# Patient Record
Sex: Female | Born: 1970 | Race: White | Hispanic: No | Smoking: Never smoker
Health system: Southern US, Community
[De-identification: ages and names within clinical notes are randomized; demographics above are authoritative.]

## PROBLEM LIST (undated history)

## (undated) DIAGNOSIS — F419 Anxiety disorder, unspecified: Secondary | ICD-10-CM

## (undated) DIAGNOSIS — F329 Major depressive disorder, single episode, unspecified: Secondary | ICD-10-CM

## (undated) DIAGNOSIS — E785 Hyperlipidemia, unspecified: Secondary | ICD-10-CM

## (undated) DIAGNOSIS — F32A Depression, unspecified: Secondary | ICD-10-CM

## (undated) DIAGNOSIS — I10 Essential (primary) hypertension: Secondary | ICD-10-CM

## (undated) DIAGNOSIS — M549 Dorsalgia, unspecified: Secondary | ICD-10-CM

## (undated) HISTORY — PX: OTHER SURGICAL HISTORY: SHX169

## (undated) HISTORY — DX: Major depressive disorder, single episode, unspecified: F32.9

## (undated) HISTORY — PX: CHOLECYSTECTOMY: SHX55

## (undated) HISTORY — DX: Essential (primary) hypertension: I10

## (undated) HISTORY — DX: Depression, unspecified: F32.A

## (undated) HISTORY — DX: Hyperlipidemia, unspecified: E78.5

## (undated) HISTORY — PX: TOTAL ABDOMINAL HYSTERECTOMY: SHX209

---

## 2011-09-02 ENCOUNTER — Encounter (INDEPENDENT_AMBULATORY_CARE_PROVIDER_SITE_OTHER): Payer: Self-pay | Admitting: Internal Medicine

## 2011-09-02 ENCOUNTER — Ambulatory Visit (INDEPENDENT_AMBULATORY_CARE_PROVIDER_SITE_OTHER): Payer: BC Managed Care – PPO | Admitting: Internal Medicine

## 2011-09-02 VITALS — BP 96/58 | HR 60 | Temp 98.4°F | Ht 63.0 in | Wt 169.7 lb

## 2011-09-02 DIAGNOSIS — F32A Depression, unspecified: Secondary | ICD-10-CM | POA: Insufficient documentation

## 2011-09-02 DIAGNOSIS — I10 Essential (primary) hypertension: Secondary | ICD-10-CM | POA: Insufficient documentation

## 2011-09-02 DIAGNOSIS — G8929 Other chronic pain: Secondary | ICD-10-CM

## 2011-09-02 DIAGNOSIS — R109 Unspecified abdominal pain: Secondary | ICD-10-CM | POA: Insufficient documentation

## 2011-09-02 DIAGNOSIS — E785 Hyperlipidemia, unspecified: Secondary | ICD-10-CM | POA: Insufficient documentation

## 2011-09-02 DIAGNOSIS — R1011 Right upper quadrant pain: Secondary | ICD-10-CM

## 2011-09-02 DIAGNOSIS — F329 Major depressive disorder, single episode, unspecified: Secondary | ICD-10-CM | POA: Insufficient documentation

## 2011-09-02 LAB — COMPREHENSIVE METABOLIC PANEL
ALT: 29 U/L (ref 0–35)
AST: 21 U/L (ref 0–37)
Alkaline Phosphatase: 58 U/L (ref 39–117)
Sodium: 142 mEq/L (ref 135–145)
Total Bilirubin: 0.3 mg/dL (ref 0.3–1.2)
Total Protein: 6.3 g/dL (ref 6.0–8.3)

## 2011-09-02 MED ORDER — DICYCLOMINE HCL 10 MG PO CAPS: 10.0000 mg | ORAL_CAPSULE | Freq: Three times a day (TID) | ORAL | Status: DC

## 2011-09-02 MED ORDER — HYOSCYAMINE SULFATE 0.125 MG SL SUBL: 0.1250 mg | SUBLINGUAL_TABLET | SUBLINGUAL | Status: DC | PRN

## 2011-09-02 NOTE — Progress Notes (Signed)
Subjective:     Patient ID: Alisha Webster, female   DOB: 04-14-1970, 41 y.o.   MRN: [id number]  HPI Alisha Webster is a 41 yr old female here today after 2 recent visits to the ED at [facility]. She was rt sided abdominal pain. She tells me she has had this pain for about 3 weeks. She was seen in the ED at [facility] x 2 and [place]  and all labs and CT were normal. The US abdomen was also normal. She underwent a cholecystectomy in 2008 because of gallstones. She was having the same type of pain. Pain is worse while working BM usually every day or every other day. No melena or bright red rectal bleeding. Appetite is good.  No unintentional weight loss.  She has rt upper quadrant pain radiating into back. The back pain is worse.  She usually has a BM about every day.  No urinary symptoms 08/21/2011: AST 28, ALT 34, ALP 55. Albumin 4.3, Lipase 21 US abdomen at [facility] was normal.  H and H 13.3 and 40.4  In 2008 she was seen by Dr. Karilyn Cota for rt upper quadrant pain.  He suspected her abdominal pain may be due to IBS although she did not have typical symptoms. Spincter of Oddi dysfuntion was also a possibility but her LFTs were normal and her CBD was not dilated.She was tt Treated with Dicyclomine and her symptoms improved.   Review of Systems see hpi Current Outpatient Prescriptions  Medication Sig Dispense Refill  . busPIRone (BUSPAR) 15 MG tablet Take 15 mg by mouth 3 (three) times daily.      . fenofibrate micronized (LOFIBRA) 134 MG capsule Take 134 mg by mouth daily before breakfast.      . FLUoxetine (PROZAC) 40 MG capsule Take 40 mg by mouth daily.      Marland Kitchen lisinopril-hydrochlorothiazide (PRINZIDE,ZESTORETIC) 20-12.5 MG per tablet Take 1 tablet by mouth daily.      . montelukast (SINGULAIR) 10 MG tablet Take 10 mg by mouth at bedtime.       Past Medical History  Diagnosis Date  . Hypertension   . Hyperlipidemia   . Depression    Past Surgical History  Procedure Date  .  Cholecystectomy     for gallstones  . Total abdominal hysterectomy     One ovary left  . Tonisillectomy    Family Status  Relation Status Death Age  . Mother Alive     good health  . Father Deceased     leukemia  . Sister Deceased     One deceased from enlarged heart. The other 2 are in  good health  . Brother Alive     fatty liver   History   Social History  . Marital Status: Married    Spouse Name: N/A    Number of Children: N/A  . Years of Education: N/A   Occupational History  . Not on file.   Social History Main Topics  . Smoking status: Never Smoker   . Smokeless tobacco: Not on file  . Alcohol Use: No  . Drug Use: No  . Sexually Active: Not on file   Other Topics Concern  . Not on file   Social History Narrative  . No narrative on file   No Known Allergies     Objective:   Physical Exam Filed Vitals:   09/02/11 1110  Height: 5\' 3"  (1.6 m)  Weight: 169 lb 11.2 oz (76.975 kg)  Assessment:    Rt upper quadrant pain: ? Etiology. Normal tranaminases.  Possible stone CBD, but doubtful. Possible IBS.     Plan:    Will request [place] report from [place]. Cmet today. Dicyclomine 10mg  TID.

## 2011-09-02 NOTE — Patient Instructions (Signed)
Cmet. Will request US abdomen from [place]. Further recommendations to follow.

## 2011-09-08 ENCOUNTER — Encounter (INDEPENDENT_AMBULATORY_CARE_PROVIDER_SITE_OTHER): Payer: Self-pay

## 2011-09-30 ENCOUNTER — Telehealth (INDEPENDENT_AMBULATORY_CARE_PROVIDER_SITE_OTHER): Payer: Self-pay | Admitting: *Deleted

## 2011-09-30 ENCOUNTER — Other Ambulatory Visit (INDEPENDENT_AMBULATORY_CARE_PROVIDER_SITE_OTHER): Payer: Self-pay | Admitting: Internal Medicine

## 2011-09-30 DIAGNOSIS — K649 Unspecified hemorrhoids: Secondary | ICD-10-CM

## 2011-09-30 MED ORDER — HYDROCORTISONE ACETATE 25 MG RE SUPP: 25.0000 mg | Freq: Every day | RECTAL | Status: AC

## 2011-09-30 NOTE — Telephone Encounter (Signed)
Alisha Webster Alisha Webster stating she is having trouble with her hemorrhoids and would like to see if something could be called. She has tried OTC medications and they are not helping. Alisha Webster uses [facility] in [place]. Patient return phone number is [phone].

## 2011-09-30 NOTE — Telephone Encounter (Signed)
Please tell patient her prescription for [facility] suppository sent to her pharmacy.

## 2011-10-02 NOTE — Telephone Encounter (Signed)
Patient called and made aware that medication had been called in. Message left on her home phone.

## 2011-10-25 ENCOUNTER — Other Ambulatory Visit (INDEPENDENT_AMBULATORY_CARE_PROVIDER_SITE_OTHER): Payer: Self-pay | Admitting: Internal Medicine

## 2012-01-11 ENCOUNTER — Ambulatory Visit (INDEPENDENT_AMBULATORY_CARE_PROVIDER_SITE_OTHER): Payer: BC Managed Care – PPO | Admitting: Internal Medicine

## 2013-01-23 ENCOUNTER — Ambulatory Visit: Payer: BC Managed Care – PPO | Admitting: Cardiology

## 2013-01-25 ENCOUNTER — Ambulatory Visit (INDEPENDENT_AMBULATORY_CARE_PROVIDER_SITE_OTHER): Payer: PRIVATE HEALTH INSURANCE | Admitting: Cardiology

## 2013-01-25 ENCOUNTER — Ambulatory Visit: Payer: BC Managed Care – PPO | Admitting: Cardiology

## 2013-01-25 ENCOUNTER — Encounter: Payer: Self-pay | Admitting: Cardiology

## 2013-01-25 VITALS — BP 131/88 | HR 82 | Ht 63.0 in | Wt 181.4 lb

## 2013-01-25 DIAGNOSIS — R55 Syncope and collapse: Secondary | ICD-10-CM | POA: Insufficient documentation

## 2013-01-25 DIAGNOSIS — I10 Essential (primary) hypertension: Secondary | ICD-10-CM

## 2013-01-25 NOTE — Progress Notes (Signed)
Clinical Summary Alisha Webster is a 42 y.o.female    1. Syncope - early September 2014 while at home, stood up out of bed and walked to bathroom. Started filling dizzy, felt like room was spinning. No chest pain. No palpitaitons, no SOB. Fell backwards into bathtub, she does not remember. Next thing she remembers she was back in bedroom and husband was dressing her to get to hospital. Woke up with a headache, felt like hear was beating very fast.  - arrived at [place], [place] hospital ER. Told she had inner ear infection and discharged home. Had CT scan of her head, EKG, and blood tests that are not available at this time.   - no prior history of syncope. Denies any prior history of palpitations, but since that event notes some palpitations. For example, at work 1 week ago episode of feeling lightheaded, sweaty, with feeling of presyncope. Has had a few other mild episodes. Episodes are associated with heart racing. Can occur at rest or with exertion. Small episodes occur on average once a week - drinks 3 diet cokes daily. No alcohol, no drug use.  - fairly sedentary lifestyle due to back pain, notes recently she has some DOE with activities she used to tolerate. No orthopnea, no PND, no LE edema.   - half sister died at age 57 due to enlarged heart, died somewhat unexpectedly (related on mothers side).     Past Medical History  Diagnosis Date  . Hypertension   . Hyperlipidemia   . Depression   Hysterectomy   No Known Allergies   Current Outpatient Prescriptions  Medication Sig Dispense Refill  . busPIRone (BUSPAR) 15 MG tablet Take 15 mg by mouth 3 (three) times daily.      Marland Kitchen dicyclomine (BENTYL) 10 MG capsule TAKE 1 CAPSULE BY MOUTH 3 TIMES DAILY.  90 capsule  5  . fenofibrate micronized (LOFIBRA) 134 MG capsule Take 134 mg by mouth daily before breakfast.      . FLUoxetine (PROZAC) 40 MG capsule Take 40 mg by mouth daily.      . hyoscyamine (LEVSIN SL) 0.125 MG SL tablet  Place 1 tablet (0.125 mg total) under the tongue every 4 (four) hours as needed for cramping.  60 tablet  4  . lisinopril-hydrochlorothiazide (PRINZIDE,ZESTORETIC) 20-12.5 MG per tablet Take 1 tablet by mouth daily.      . montelukast (SINGULAIR) 10 MG tablet Take 10 mg by mouth at bedtime.       No current facility-administered medications for this visit.     Past Surgical History  Procedure Laterality Date  . Cholecystectomy      for gallstones  . Total abdominal hysterectomy      One ovary left  . Tonisillectomy       No Known Allergies    No family history on file.   Social History Ms. Purdum reports that she has never smoked. She does not have any smokeless tobacco history on file. Ms. Minier reports that she does not drink alcohol.   Review of Systems CONSTITUTIONAL: No weight loss, fever, chills, weakness or fatigue.  HEENT: Eyes: No visual loss, blurred vision, double vision or yellow sclerae.No hearing loss, sneezing, congestion, runny nose or sore throat.  SKIN: No rash or itching.  CARDIOVASCULAR: per HPI RESPIRATORY: No shortness of breath, cough or sputum.  GASTROINTESTINAL: No anorexia, nausea, vomiting or diarrhea. No abdominal pain or blood.  GENITOURINARY: No burning on urination, no polyuria NEUROLOGICAL: per HPI MUSCULOSKELETAL:  No muscle, back pain, joint pain or stiffness.  LYMPHATICS: No enlarged nodes. No history of splenectomy.  PSYCHIATRIC: No history of depression or anxiety.  ENDOCRINOLOGIC: No reports of sweating, cold or heat intolerance. No polyuria or polydipsia.  Marland Kitchen   Physical Examination p 82 bp 131/88 Wt 181 lbs BMI 32 Sat 98% Gen: resting comfortably, no acute distress HEENT: no scleral icterus, pupils equal round and reactive, no palptable cervical adenopathy,  CV: RRR, no m/r/g, no JVD, no carotid bruits Resp: Clear to auscultation bilaterally GI: abdomen is soft, non-tender, non-distended, normal bowel sounds, no  hepatosplenomegaly MSK: extremities are warm, no edema.  Skin: warm, no rash Neuro:  no focal deficits Psych: appropriate affect   Diagnostic Studies 01/25/13 Clinic EKG: sinus rhythm rate 63, normal axis, PR 140 ms, QRS 80 ms, Qtc 400 ms, no preexcitation, no Brugada pattern    Assessment and Plan  1. Syncope - unclear etiology at this time, symptoms are most consistent with an arrhythmia as the possible cause - will check 14 day event monitor - check 2D echo for structural heart disease, she does have a half sister who passed from a "weak heart" - obtain records from [facility] hospital visit, pending on labs they sent may need to send more at next visit (ex. TSH)  F/u 3 weeks.      Antoine Poche, M.D., F.A.C.C.

## 2013-01-25 NOTE — Patient Instructions (Signed)
Your physician recommends that you schedule a follow-up appointment in: 3 weeks with Dr. Wyline Mood. This appointment will be scheduled today before you leave today.  Your physician recommends that you continue on your current medications as directed. Please refer to the Current Medication list given to you today.  Your physician has requested that you have an echocardiogram. Echocardiography is a painless test that uses sound waves to create images of your heart. It provides your doctor with information about the size and shape of your heart and how well your heart's chambers and valves are working. This procedure takes approximately one hour. There are no restrictions for this procedure.  Your physician has recommended that you wear an 14 day event monitor. Event monitors are medical devices that record the heart's electrical activity. Doctors most often [place] these monitors to diagnose arrhythmias. Arrhythmias are problems with the speed or rhythm of the heartbeat. The monitor is a small, portable device. You can wear one while you do your normal daily activities. This is usually used to diagnose what is causing palpitations/syncope (passing out).

## 2013-01-31 DIAGNOSIS — R55 Syncope and collapse: Secondary | ICD-10-CM

## 2013-02-08 ENCOUNTER — Other Ambulatory Visit (INDEPENDENT_AMBULATORY_CARE_PROVIDER_SITE_OTHER): Payer: PRIVATE HEALTH INSURANCE

## 2013-02-08 ENCOUNTER — Other Ambulatory Visit: Payer: Self-pay

## 2013-02-08 DIAGNOSIS — R55 Syncope and collapse: Secondary | ICD-10-CM

## 2013-02-10 ENCOUNTER — Telehealth: Payer: Self-pay | Admitting: Cardiology

## 2013-02-10 NOTE — Telephone Encounter (Signed)
Pt informed of results.

## 2013-02-10 NOTE — Telephone Encounter (Signed)
Message copied by Burnice Logan on Fri Feb 10, 2013 12:03 PM ------      Message from: Dina Rich F      Created: Fri Feb 10, 2013  9:47 AM       Please let patient know there are no significant abnormalities on her echo                  Dina Rich MD ------

## 2013-02-10 NOTE — Telephone Encounter (Signed)
LM for pt to returncall

## 2013-02-14 ENCOUNTER — Ambulatory Visit (INDEPENDENT_AMBULATORY_CARE_PROVIDER_SITE_OTHER): Payer: PRIVATE HEALTH INSURANCE | Admitting: Cardiology

## 2013-02-14 ENCOUNTER — Encounter: Payer: Self-pay | Admitting: Cardiology

## 2013-02-14 VITALS — BP 127/84 | HR 71 | Ht 63.0 in | Wt 180.0 lb

## 2013-02-14 DIAGNOSIS — R55 Syncope and collapse: Secondary | ICD-10-CM

## 2013-02-14 NOTE — Progress Notes (Signed)
Clinical Summary Alisha Webster is a 42 y.o.female seen today for follow up for the following medical problems.    1. Syncope  - early September 2014 while at home, stood up out of bed and walked to bathroom. Started filling dizzy, felt like room was spinning. No chest pain. No palpitaitons, no SOB. Fell backwards into bathtub, she does not remember. Next thing she remembers she was back in bedroom and husband was dressing her to get to hospital. Woke up with a headache, felt like hear was beating very fast.  - arrived at [place], [place] hospital ER. Told she had inner ear infection and discharged home. Head CT scan of her head, EKG, and blood tests that are not available at this time.  - no prior history of syncope. Denies any prior history of palpitations, but since that event notes some palpitations. For example, at work 1 week ago episode of feeling lightheaded, sweaty, with feeling of presyncope. Has had a few other mild episodes. Episodes are associated with heart racing. Can occur at rest or with exertion. Small episodes occur on average once a week  - drinks 3 diet cokes daily. No alcohol, no drug use.  - fairly sedentary lifestyle due to back pain, notes recently she has some DOE with activities she used to tolerate. No orthopnea, no PND, no LE edema.  - half sister died at age 41 due to enlarged heart, died somewhat unexpectedly (related on mothers side).   - denies any symptoms over the last few weeks, did not have any at all while wearing monitor.     Past Medical History  Diagnosis Date  . Hypertension   . Hyperlipidemia   . Depression      No Known Allergies   Current Outpatient Prescriptions  Medication Sig Dispense Refill  . busPIRone (BUSPAR) 15 MG tablet Take 15 mg by mouth every morning.       . fenofibrate micronized (LOFIBRA) 134 MG capsule Take 134 mg by mouth daily before breakfast.      . fluticasone (FLONASE) 50 MCG/ACT nasal spray Place 2 sprays into the  nose daily.      . hyoscyamine (LEVSIN SL) 0.125 MG SL tablet Place 1 tablet (0.125 mg total) under the tongue every 4 (four) hours as needed for cramping.  60 tablet  4  . lisinopril-hydrochlorothiazide (PRINZIDE,ZESTORETIC) 20-12.5 MG per tablet Take 1 tablet by mouth every morning.       . methocarbamol (ROBAXIN) 500 MG tablet Take 500 mg by mouth 2 (two) times daily as needed.      . naproxen sodium (ANAPROX) 220 MG tablet Take 220 mg by mouth as needed.      . pantoprazole (PROTONIX) 40 MG tablet Take 40 mg by mouth daily.      Marland Kitchen venlafaxine (EFFEXOR) 37.5 MG tablet Take 37.5 mg by mouth every morning.       No current facility-administered medications for this visit.     Past Surgical History  Procedure Laterality Date  . Cholecystectomy      for gallstones  . Total abdominal hysterectomy      One ovary left  . Tonisillectomy       No Known Allergies    No family history on file.   Social History Ms. Ra reports that she has never smoked. She does not have any smokeless tobacco history on file. Ms. Cooner reports that she does not drink alcohol.   Review of Systems CONSTITUTIONAL: No weight  loss, fever, chills, weakness or fatigue.  HEENT: Eyes: No visual loss, blurred vision, double vision or yellow sclerae.No hearing loss, sneezing, congestion, runny nose or sore throat.  SKIN: No rash or itching.  CARDIOVASCULAR: per HPI RESPIRATORY: per HPI.  GASTROINTESTINAL: No anorexia, nausea, vomiting or diarrhea. No abdominal pain or blood.  GENITOURINARY: No burning on urination, no polyuria NEUROLOGICAL: No headache, dizziness, syncope, paralysis, ataxia, numbness or tingling in the extremities. No change in bowel or bladder control.  MUSCULOSKELETAL: No muscle, back pain, joint pain or stiffness.  LYMPHATICS: No enlarged nodes. No history of splenectomy.  PSYCHIATRIC: No history of depression or anxiety.  ENDOCRINOLOGIC: No reports of sweating, cold or heat  intolerance. No polyuria or polydipsia.  Marland Kitchen   Physical Examination p 71 bp 127/84 Wt 180 lbs BMI 32 Gen: resting comfortably, no acute distress HEENT: no scleral icterus, pupils equal round and reactive, no palptable cervical adenopathy,  CV: RRR, no m/r/g,no JVD, no carotid bruits Resp: Clear to auscultation bilaterally GI: abdomen is soft, non-tender, non-distended, normal bowel sounds, no hepatosplenomegaly MSK: extremities are warm, no edema.  Skin: warm, no rash Neuro:  no focal deficits Psych: appropriate affect   Diagnostic Studies 01/25/13 Clinic EKG: sinus rhythm rate 63, normal axis, PR 140 ms, QRS 80 ms, Qtc 400 ms, no preexcitation, no Brugada pattern  01/2013 Echo: LVEF 60-65%, no WMAs, grade I diastolic dysfunction.    Assessment and Plan  1. Syncope  - no significant abnormalities by echo, the early reports on her event monitor show no significant arrhytymias, she has not had any symptoms - will follow up completion of event monitor - no symptoms in quite some time, continue to follow clinically. If symptoms reoccur consider further diagnostic workup at that time.         Antoine Poche, M.D., F.A.C.C.

## 2013-02-14 NOTE — Patient Instructions (Signed)
Your physician recommends that you schedule a follow-up appointment in: 6 month with Dr. Wyline Mood. You should receive a letter in the mail in 4 months. If you do not receive this letter by March 2015 call our office to schedule this appointment.   Your physician recommends that you continue on your current medications as directed. Please refer to the Current Medication list given to you today.

## 2013-02-15 ENCOUNTER — Telehealth: Payer: Self-pay | Admitting: Cardiology

## 2013-02-15 NOTE — Telephone Encounter (Signed)
Informed pt that final results for monitor were received and monitor was normal .

## 2013-07-25 ENCOUNTER — Telehealth: Payer: Self-pay | Admitting: Cardiology

## 2013-07-25 NOTE — Telephone Encounter (Signed)
Contacted patient to try to schedule her May f/u. She doesn't feel that she needs to come back, but if she does she will contact us

## 2013-09-22 ENCOUNTER — Other Ambulatory Visit (HOSPITAL_BASED_OUTPATIENT_CLINIC_OR_DEPARTMENT_OTHER): Payer: Self-pay | Admitting: Emergency Medicine

## 2013-09-22 DIAGNOSIS — M5412 Radiculopathy, cervical region: Secondary | ICD-10-CM

## 2013-09-23 ENCOUNTER — Ambulatory Visit (HOSPITAL_BASED_OUTPATIENT_CLINIC_OR_DEPARTMENT_OTHER): Payer: PRIVATE HEALTH INSURANCE

## 2013-09-26 ENCOUNTER — Ambulatory Visit (HOSPITAL_BASED_OUTPATIENT_CLINIC_OR_DEPARTMENT_OTHER)
Admission: RE | Admit: 2013-09-26 | Discharge: 2013-09-26 | Disposition: A | Discharge status: Home / Self Care | Payer: BC Managed Care – PPO | Source: Ambulatory Visit | Attending: Emergency Medicine | Admitting: Emergency Medicine

## 2013-09-26 DIAGNOSIS — M5412 Radiculopathy, cervical region: Secondary | ICD-10-CM

## 2013-09-26 DIAGNOSIS — M5020 Other cervical disc displacement, unspecified cervical region: Secondary | ICD-10-CM | POA: Insufficient documentation

## 2013-09-26 DIAGNOSIS — M542 Cervicalgia: Secondary | ICD-10-CM | POA: Insufficient documentation

## 2013-09-26 DIAGNOSIS — M5030 Other cervical disc degeneration, unspecified cervical region: Secondary | ICD-10-CM | POA: Insufficient documentation

## 2014-01-22 ENCOUNTER — Encounter (HOSPITAL_BASED_OUTPATIENT_CLINIC_OR_DEPARTMENT_OTHER): Payer: Self-pay | Admitting: Emergency Medicine

## 2014-01-22 ENCOUNTER — Emergency Department (HOSPITAL_BASED_OUTPATIENT_CLINIC_OR_DEPARTMENT_OTHER)
Admission: EM | Admit: 2014-01-22 | Discharge: 2014-01-22 | Disposition: A | Discharge status: Home / Self Care | Payer: BC Managed Care – PPO | Attending: Emergency Medicine | Admitting: Emergency Medicine

## 2014-01-22 DIAGNOSIS — Z7951 Long term (current) use of inhaled steroids: Secondary | ICD-10-CM | POA: Insufficient documentation

## 2014-01-22 DIAGNOSIS — F329 Major depressive disorder, single episode, unspecified: Secondary | ICD-10-CM | POA: Diagnosis not present

## 2014-01-22 DIAGNOSIS — J3489 Other specified disorders of nose and nasal sinuses: Secondary | ICD-10-CM | POA: Diagnosis not present

## 2014-01-22 DIAGNOSIS — R51 Headache: Secondary | ICD-10-CM | POA: Insufficient documentation

## 2014-01-22 DIAGNOSIS — R0981 Nasal congestion: Secondary | ICD-10-CM | POA: Diagnosis not present

## 2014-01-22 DIAGNOSIS — I10 Essential (primary) hypertension: Secondary | ICD-10-CM | POA: Diagnosis not present

## 2014-01-22 DIAGNOSIS — Z8639 Personal history of other endocrine, nutritional and metabolic disease: Secondary | ICD-10-CM | POA: Diagnosis not present

## 2014-01-22 DIAGNOSIS — R6889 Other general symptoms and signs: Secondary | ICD-10-CM

## 2014-01-22 DIAGNOSIS — Z79899 Other long term (current) drug therapy: Secondary | ICD-10-CM | POA: Diagnosis not present

## 2014-01-22 DIAGNOSIS — M545 Low back pain: Secondary | ICD-10-CM | POA: Insufficient documentation

## 2014-01-22 DIAGNOSIS — R448 Other symptoms and signs involving general sensations and perceptions: Secondary | ICD-10-CM

## 2014-01-22 DIAGNOSIS — G8929 Other chronic pain: Secondary | ICD-10-CM | POA: Diagnosis not present

## 2014-01-22 DIAGNOSIS — M549 Dorsalgia, unspecified: Secondary | ICD-10-CM

## 2014-01-22 HISTORY — DX: Dorsalgia, unspecified: M54.9

## 2014-01-22 MED ORDER — OXYCODONE HCL 5 MG PO TABS: 5.0000 mg | ORAL_TABLET | Freq: Four times a day (QID) | ORAL | Status: DC | PRN

## 2014-01-22 MED ORDER — TRIAMCINOLONE ACETONIDE 55 MCG/ACT NA AERO: 2.0000 | INHALATION_SPRAY | Freq: Every day | NASAL | Status: DC

## 2014-01-22 NOTE — ED Notes (Signed)
Chronic Low back pain that pt. Has been to pain management for and is not happy with her treatment so per Pt. She is going to see a new Dr per Pt.   Pt. Reports she has had sinus headache for the past 5 days with sinus congestion and sinus headache.

## 2014-01-22 NOTE — ED Provider Notes (Signed)
Medical screening examination/treatment/procedure(s) were performed by non-physician practitioner and as supervising physician I was immediately available for consultation/collaboration.   EKG Interpretation None        Milisa Kimbell, MD 01/22/14 1508 

## 2014-01-22 NOTE — Discharge Instructions (Signed)

## 2014-01-22 NOTE — ED Notes (Addendum)
Pt directed to pick up RX in pharmacy. Pt eval by EDNP Pickering and pended for d/c prior to RN assessment

## 2014-01-22 NOTE — ED Provider Notes (Signed)
CSN: [id number]     Arrival date & time 01/22/14  1325 History   First MD Initiated Contact with Patient 01/22/14 1349     Chief Complaint  Patient presents with  . Back Pain  . Facial Pain     (Consider location/radiation/quality/duration/timing/severity/associated sxs/prior Treatment) HPI Comments: Pt comes in today with 2 complaint. Pt c/o facial pain and pressure times 4 days and chronic low back pain. Pt states that she was seeing chronic pain doctor but didn't like what they were doing so she hasn't seen them in a month. Denies any new injury or new symptoms she has just been out of her pain medications and she is not sleeping at night because of the pain. 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 Denies numbness, weakness or incontinence. No dysuria. States that she has had congestion without fever. States that she takes flonase all the time.  The history is provided by the patient. No language interpreter was used.    Past Medical History  Diagnosis Date  . Hypertension   . Hyperlipidemia   . Depression   . Back pain    Past Surgical History  Procedure Laterality Date  . Cholecystectomy      for gallstones  . Total abdominal hysterectomy      One ovary left  . Tonisillectomy     No family history on file. History  Substance Use Topics  . Smoking status: Never Smoker   . Smokeless tobacco: Never Used  . Alcohol Use: No   OB History   Grav Para Term Preterm Abortions TAB SAB Ect Mult Living                 Review of Systems  All other systems reviewed and are negative.     Allergies  Review of patient's allergies indicates no known allergies.  Home Medications   Prior to Admission medications   Medication Sig Start Date End Date Taking? Authorizing Provider  busPIRone (BUSPAR) 15 MG tablet Take 15 mg by mouth every morning.     Historical Provider, MD  fluticasone (FLONASE) 50 MCG/ACT nasal spray Place 2 sprays into the nose daily.     Historical Provider, MD  methocarbamol (ROBAXIN) 500 MG tablet Take 500 mg by mouth 2 (two) times daily as needed.    Historical Provider, MD  oxyCODONE (OXY IR/ROXICODONE) 5 MG immediate release tablet Take 1 tablet (5 mg total) by mouth every 6 (six) hours as needed for severe pain. 01/22/14   Teressa LowerVrinda Latoshia Monrroy, NP  triamcinolone (NASACORT AQ) 55 MCG/ACT AERO nasal inhaler Place 2 sprays into the nose daily. 01/22/14   Teressa LowerVrinda Marios Gaiser, NP  venlafaxine (EFFEXOR) 37.5 MG tablet Take 37.5 mg by mouth every morning.    Historical Provider, MD   BP 132/90  Pulse 84  Temp(Src) 98.4 F (36.9 C) (Oral)  Resp 18  Ht 5\' 3"  (1.6 m)  Wt 175 lb (79.379 kg)  BMI 31.01 kg/m2  SpO2 100% Physical Exam  Nursing note and vitals reviewed. Constitutional: She is oriented to person, place, and time. She appears well-developed and well-nourished.  HENT:  Nose: Rhinorrhea present.  Mouth/Throat: Oropharynx is clear and moist.  Tube noted in the right ear. Scar tissue noted in left. No infection noted to either  Eyes: Conjunctivae and EOM are normal. Pupils are equal, round, and reactive to light.  Cardiovascular: Normal rate and regular rhythm.   Pulmonary/Chest: Effort normal and breath sounds normal.  Musculoskeletal:  Normal range of motion.  Moving all extremities without any problem. Non tender to palpation  Neurological: She is alert and oriented to person, place, and time.  Skin: Skin is warm and dry.  Psychiatric: She has a normal mood and affect.    ED Course  Procedures (including critical care time) Labs Review Labs Reviewed - No data to display  Imaging Review No results found.   EKG Interpretation None      MDM   Final diagnoses:  Nasal congestion  Chronic back pain  Facial pressure    Will treat pain with oxycodone, discussed that chronic pain will not continue to be treated in the er. Don't think pt has sinusitis, think likely allergy related and pt can do symptomatic  treatment at home    Teressa LowerVrinda Chinmayi Rumer, NP 01/22/14 1414

## 2014-02-01 ENCOUNTER — Encounter (HOSPITAL_BASED_OUTPATIENT_CLINIC_OR_DEPARTMENT_OTHER): Payer: Self-pay

## 2014-02-01 ENCOUNTER — Emergency Department (HOSPITAL_BASED_OUTPATIENT_CLINIC_OR_DEPARTMENT_OTHER)
Admission: EM | Admit: 2014-02-01 | Discharge: 2014-02-01 | Disposition: A | Discharge status: Home / Self Care | Payer: BC Managed Care – PPO | Attending: Emergency Medicine | Admitting: Emergency Medicine

## 2014-02-01 DIAGNOSIS — I10 Essential (primary) hypertension: Secondary | ICD-10-CM | POA: Diagnosis not present

## 2014-02-01 DIAGNOSIS — F329 Major depressive disorder, single episode, unspecified: Secondary | ICD-10-CM | POA: Insufficient documentation

## 2014-02-01 DIAGNOSIS — R1110 Vomiting, unspecified: Secondary | ICD-10-CM | POA: Diagnosis present

## 2014-02-01 DIAGNOSIS — R197 Diarrhea, unspecified: Secondary | ICD-10-CM | POA: Diagnosis not present

## 2014-02-01 DIAGNOSIS — Z7952 Long term (current) use of systemic steroids: Secondary | ICD-10-CM | POA: Insufficient documentation

## 2014-02-01 DIAGNOSIS — Z79899 Other long term (current) drug therapy: Secondary | ICD-10-CM | POA: Diagnosis not present

## 2014-02-01 DIAGNOSIS — Z8639 Personal history of other endocrine, nutritional and metabolic disease: Secondary | ICD-10-CM | POA: Insufficient documentation

## 2014-02-01 DIAGNOSIS — Z7951 Long term (current) use of inhaled steroids: Secondary | ICD-10-CM | POA: Diagnosis not present

## 2014-02-01 LAB — URINALYSIS, ROUTINE W REFLEX MICROSCOPIC
Bilirubin Urine: NEGATIVE
GLUCOSE, UA: NEGATIVE mg/dL
Hgb urine dipstick: NEGATIVE
KETONES UR: NEGATIVE mg/dL
LEUKOCYTES UA: NEGATIVE
Nitrite: NEGATIVE
PH: 7 (ref 5.0–8.0)
Protein, ur: NEGATIVE mg/dL
Specific Gravity, Urine: 1.007 (ref 1.005–1.030)
Urobilinogen, UA: 0.2 mg/dL (ref 0.0–1.0)

## 2014-02-01 LAB — CBC WITH DIFFERENTIAL/PLATELET
Basophils Absolute: 0.1 10*3/uL (ref 0.0–0.1)
Basophils Relative: 1 % (ref 0–1)
Eosinophils Absolute: 0.3 10*3/uL (ref 0.0–0.7)
Eosinophils Relative: 3 % (ref 0–5)
HCT: 41.3 % (ref 36.0–46.0)
HEMOGLOBIN: 13.9 g/dL (ref 12.0–15.0)
LYMPHS ABS: 1.7 10*3/uL (ref 0.7–4.0)
LYMPHS PCT: 16 % (ref 12–46)
MCH: 30 pg (ref 26.0–34.0)
MCHC: 33.7 g/dL (ref 30.0–36.0)
MCV: 89 fL (ref 78.0–100.0)
Monocytes Absolute: 0.4 10*3/uL (ref 0.1–1.0)
Monocytes Relative: 4 % (ref 3–12)
Neutro Abs: 8.3 10*3/uL — ABNORMAL HIGH (ref 1.7–7.7)
Neutrophils Relative %: 76 % (ref 43–77)
PLATELETS: 283 10*3/uL (ref 150–400)
RBC: 4.64 MIL/uL (ref 3.87–5.11)
RDW: 12.3 % (ref 11.5–15.5)
WBC: 10.7 10*3/uL — AB (ref 4.0–10.5)

## 2014-02-01 LAB — COMPREHENSIVE METABOLIC PANEL
ALT: 17 U/L (ref 0–35)
AST: 15 U/L (ref 0–37)
Albumin: 3.6 g/dL (ref 3.5–5.2)
Alkaline Phosphatase: 84 U/L (ref 39–117)
Anion gap: 13 (ref 5–15)
BUN: 14 mg/dL (ref 6–23)
CALCIUM: 9.4 mg/dL (ref 8.4–10.5)
CO2: 23 mEq/L (ref 19–32)
CREATININE: 0.6 mg/dL (ref 0.50–1.10)
Chloride: 107 mEq/L (ref 96–112)
GFR calc Af Amer: >90 mL/min (ref >90)
Glucose, Bld: 108 mg/dL — ABNORMAL HIGH (ref 70–99)
Potassium: 4.5 mEq/L (ref 3.7–5.3)
Sodium: 143 mEq/L (ref 137–147)
TOTAL PROTEIN: 7.1 g/dL (ref 6.0–8.3)
Total Bilirubin: <0.2 mg/dL — ABNORMAL LOW (ref 0.3–1.2)

## 2014-02-01 MED ORDER — METOCLOPRAMIDE HCL 5 MG/ML IJ SOLN
10.0000 mg | Freq: Once | INTRAMUSCULAR | Status: AC
  Administered 2014-02-01: 10 mg via INTRAVENOUS
  Filled 2014-02-01: qty 2

## 2014-02-01 MED ORDER — ONDANSETRON HCL 4 MG/2ML IJ SOLN
4.0000 mg | Freq: Once | INTRAMUSCULAR | Status: AC
  Administered 2014-02-01: 4 mg via INTRAVENOUS
  Filled 2014-02-01: qty 2

## 2014-02-01 MED ORDER — SODIUM CHLORIDE 0.9 % IV BOLUS (SEPSIS)
1000.0000 mL | Freq: Once | INTRAVENOUS | Status: AC
  Administered 2014-02-01: 1000 mL via INTRAVENOUS

## 2014-02-01 MED ORDER — DIPHENHYDRAMINE HCL 50 MG/ML IJ SOLN
25.0000 mg | Freq: Once | INTRAMUSCULAR | Status: AC
  Administered 2014-02-01: 25 mg via INTRAVENOUS
  Filled 2014-02-01: qty 1

## 2014-02-01 MED ORDER — PROMETHAZINE HCL 25 MG PO TABS: 25.0000 mg | ORAL_TABLET | Freq: Four times a day (QID) | ORAL | Status: DC | PRN

## 2014-02-01 NOTE — ED Notes (Signed)
Pickering, EDNP notified of pt's response to reglan- vorb received for IV Benadryl 25mg 

## 2014-02-01 NOTE — ED Provider Notes (Signed)
CSN: [id number]     Arrival date & time 02/01/14  1512 History   First MD Initiated Contact with Patient 02/01/14 1520     Chief Complaint  Patient presents with  . Emesis     (Consider location/radiation/quality/duration/timing/severity/associated sxs/prior Treatment) HPI Comments: Pt comes in today with vomiting and diarrhea times a couple of days. Pt states that she has had episodes like this over the last year. She states that she came in today because she felt lightheaded. She states that she does have a history of renal failure during one of these episodes. Denies abdominal pain. State that she tried immodium without relief. States hasn't been able to keep anything down today  The history is provided by the patient. No language interpreter was used.    Past Medical History  Diagnosis Date  . Hypertension   . Hyperlipidemia   . Depression   . Back pain    Past Surgical History  Procedure Laterality Date  . Cholecystectomy      for gallstones  . Total abdominal hysterectomy      One ovary left  . Tonisillectomy     No family history on file. History  Substance Use Topics  . Smoking status: Never Smoker   . Smokeless tobacco: Never Used  . Alcohol Use: No   OB History    No data available     Review of Systems  All other systems reviewed and are negative.     Allergies  Review of patient's allergies indicates no known allergies.  Home Medications   Prior to Admission medications   Medication Sig Start Date End Date Taking? Authorizing Provider  busPIRone (BUSPAR) 15 MG tablet Take 15 mg by mouth every morning.     Historical Provider, MD  fluticasone (FLONASE) 50 MCG/ACT nasal spray Place 2 sprays into the nose daily.    Historical Provider, MD  methocarbamol (ROBAXIN) 500 MG tablet Take 500 mg by mouth 2 (two) times daily as needed.    Historical Provider, MD  oxyCODONE (OXY IR/ROXICODONE) 5 MG immediate release tablet Take 1 tablet (5 mg total) by mouth  every 6 (six) hours as needed for severe pain. 01/22/14   Teressa LowerVrinda Braidyn Scorsone, NP  triamcinolone (NASACORT AQ) 55 MCG/ACT AERO nasal inhaler Place 2 sprays into the nose daily. 01/22/14   Teressa LowerVrinda Cortez Flippen, NP  venlafaxine (EFFEXOR) 37.5 MG tablet Take 37.5 mg by mouth every morning.    Historical Provider, MD   BP 139/76 mmHg  Pulse 67  Temp(Src) 97.8 F (36.6 C) (Oral)  Resp 16  Ht 5\' 4"  (1.626 m)  Wt 176 lb (79.833 kg)  BMI 30.20 kg/m2  SpO2 100% Physical Exam  Constitutional: She is oriented to person, place, and time. She appears well-developed and well-nourished.  Cardiovascular: Normal rate and regular rhythm.   Pulmonary/Chest: Effort normal and breath sounds normal.  Abdominal: Soft. Bowel sounds are normal. There is no tenderness.  Musculoskeletal: Normal range of motion.  Neurological: She is alert and oriented to person, place, and time.  Skin: Skin is warm and dry.  Psychiatric: She has a normal mood and affect.  Nursing note and vitals reviewed.   ED Course  Procedures (including critical care time) Labs Review Labs Reviewed  COMPREHENSIVE METABOLIC PANEL - Abnormal; Notable for the following:    Glucose, Bld 108 (*)    Total Bilirubin <0.2 (*)    All other components within normal limits  CBC WITH DIFFERENTIAL - Abnormal; Notable for the following:  WBC 10.7 (*)    Neutro Abs 8.3 (*)    All other components within normal limits  URINALYSIS, ROUTINE W REFLEX MICROSCOPIC    Imaging Review No results found.   EKG Interpretation None      MDM   Final diagnoses:  Vomiting and diarrhea    Pt is tolerating po without vomiting. Abdomen is benign. Pt sent home with script of phenergan and gi follow up    Teressa LowerVrinda Jayona Mccaig, NP 02/01/14 1820  Richardean Canalavid H Yao, MD 02/02/14 [phone]

## 2014-02-01 NOTE — Discharge Instructions (Signed)

## 2014-02-01 NOTE — ED Notes (Signed)
Pt reports feeling better after benadryl  Given - d/c home with family- rx x 1 given for phenergan- work note given

## 2014-02-01 NOTE — ED Notes (Signed)
Pt reports n/v/d x "a while." Reports going on since last year. Sts lightheadedness since last night

## 2014-02-13 ENCOUNTER — Other Ambulatory Visit: Payer: Self-pay | Admitting: Gastroenterology

## 2014-02-13 DIAGNOSIS — R112 Nausea with vomiting, unspecified: Secondary | ICD-10-CM

## 2014-03-11 ENCOUNTER — Emergency Department (HOSPITAL_BASED_OUTPATIENT_CLINIC_OR_DEPARTMENT_OTHER)
Admission: EM | Admit: 2014-03-11 | Discharge: 2014-03-11 | Disposition: A | Discharge status: Home / Self Care | Payer: BC Managed Care – PPO | Attending: Emergency Medicine | Admitting: Emergency Medicine

## 2014-03-11 ENCOUNTER — Emergency Department (HOSPITAL_BASED_OUTPATIENT_CLINIC_OR_DEPARTMENT_OTHER): Payer: BC Managed Care – PPO

## 2014-03-11 ENCOUNTER — Encounter (HOSPITAL_BASED_OUTPATIENT_CLINIC_OR_DEPARTMENT_OTHER): Payer: Self-pay

## 2014-03-11 DIAGNOSIS — R05 Cough: Secondary | ICD-10-CM | POA: Diagnosis present

## 2014-03-11 DIAGNOSIS — Y9289 Other specified places as the place of occurrence of the external cause: Secondary | ICD-10-CM | POA: Insufficient documentation

## 2014-03-11 DIAGNOSIS — W19XXXA Unspecified fall, initial encounter: Secondary | ICD-10-CM

## 2014-03-11 DIAGNOSIS — Z8781 Personal history of (healed) traumatic fracture: Secondary | ICD-10-CM | POA: Diagnosis not present

## 2014-03-11 DIAGNOSIS — I10 Essential (primary) hypertension: Secondary | ICD-10-CM | POA: Insufficient documentation

## 2014-03-11 DIAGNOSIS — S8991XA Unspecified injury of right lower leg, initial encounter: Secondary | ICD-10-CM | POA: Diagnosis not present

## 2014-03-11 DIAGNOSIS — F329 Major depressive disorder, single episode, unspecified: Secondary | ICD-10-CM | POA: Diagnosis not present

## 2014-03-11 DIAGNOSIS — W010XXA Fall on same level from slipping, tripping and stumbling without subsequent striking against object, initial encounter: Secondary | ICD-10-CM | POA: Insufficient documentation

## 2014-03-11 DIAGNOSIS — Z79899 Other long term (current) drug therapy: Secondary | ICD-10-CM | POA: Insufficient documentation

## 2014-03-11 DIAGNOSIS — Z8639 Personal history of other endocrine, nutritional and metabolic disease: Secondary | ICD-10-CM | POA: Insufficient documentation

## 2014-03-11 DIAGNOSIS — Y9389 Activity, other specified: Secondary | ICD-10-CM | POA: Diagnosis not present

## 2014-03-11 DIAGNOSIS — Y998 Other external cause status: Secondary | ICD-10-CM | POA: Insufficient documentation

## 2014-03-11 DIAGNOSIS — J0100 Acute maxillary sinusitis, unspecified: Secondary | ICD-10-CM | POA: Diagnosis not present

## 2014-03-11 DIAGNOSIS — Z7951 Long term (current) use of inhaled steroids: Secondary | ICD-10-CM | POA: Diagnosis not present

## 2014-03-11 DIAGNOSIS — R42 Dizziness and giddiness: Secondary | ICD-10-CM

## 2014-03-11 MED ORDER — OXYCODONE HCL 5 MG PO TABS: 5.0000 mg | ORAL_TABLET | ORAL | Status: DC | PRN

## 2014-03-11 MED ORDER — AMOXICILLIN 500 MG PO CAPS: 500.0000 mg | ORAL_CAPSULE | Freq: Three times a day (TID) | ORAL | Status: AC

## 2014-03-11 NOTE — Discharge Instructions (Signed)
Vertigo Vertigo means you feel like you or your surroundings are moving when they are not. Vertigo can be dangerous if it occurs when you are at work, driving, or performing difficult activities.  CAUSES  Vertigo occurs when there is a conflict of signals sent to your brain from the visual and sensory systems in your body. There are many different causes of vertigo, including:  Infections, especially in the inner ear.  A bad reaction to a drug or misuse of alcohol and medicines.  Withdrawal from drugs or alcohol.  Rapidly changing positions, such as lying down or rolling over in bed.  A migraine headache.  Decreased blood flow to the brain.  Increased pressure in the brain from a head injury, infection, tumor, or bleeding. SYMPTOMS  You may feel as though the world is spinning around or you are falling to the ground. Because your balance is upset, vertigo can cause nausea and vomiting. You may have involuntary eye movements (nystagmus). DIAGNOSIS  Vertigo is usually diagnosed by physical exam. If the cause of your vertigo is unknown, your caregiver may perform imaging tests, such as an MRI scan (magnetic resonance imaging). TREATMENT  Most cases of vertigo resolve on their own, without treatment. Depending on the cause, your caregiver may prescribe certain medicines. If your vertigo is related to body position issues, your caregiver may recommend movements or procedures to correct the problem. In rare cases, if your vertigo is caused by certain inner ear problems, you may need surgery. HOME CARE INSTRUCTIONS   Follow your caregiver's instructions.  Avoid driving.  Avoid operating heavy machinery.  Avoid performing any tasks that would be dangerous to you or others during a vertigo episode.  Tell your caregiver if you notice that certain medicines seem to be causing your vertigo. Some of the medicines used to treat vertigo episodes can actually make them worse in some people. SEEK  IMMEDIATE MEDICAL CARE IF:   Your medicines do not relieve your vertigo or are making it worse.  You develop problems with talking, walking, weakness, or using your arms, hands, or legs.  You develop severe headaches.  Your nausea or vomiting continues or gets worse.  You develop visual changes.  A family member notices behavioral changes.  Your condition gets worse. MAKE SURE YOU:  Understand these instructions.  Will watch your condition.  Will get help right away if you are not doing well or get worse. Document Released: 12/24/2004 Document Revised: 06/08/2011 Document Reviewed: 10/02/2010 [facility]ExitCare Patient Information 2015 [place], [place]. This information is not intended to replace advice given to you by your health care provider. Make sure you discuss any questions you have with your health care provider.  Sinusitis Sinusitis is redness, soreness, and puffiness (inflammation) of the air pockets in the bones of your face (sinuses). The redness, soreness, and puffiness can cause air and mucus to get trapped in your sinuses. This can allow germs to grow and cause an infection.  HOME CARE   Drink enough fluids to keep your pee (urine) clear or pale yellow.  Use a humidifier in your home.  Run a hot shower to create steam in the bathroom. Sit in the bathroom with the door closed. Breathe in the steam 3-4 times a day.  Put a warm, moist washcloth on your face 3-4 times a day, or as told by your doctor.  Use salt water sprays (saline sprays) to wet the thick fluid in your nose. This can help the sinuses drain.  Only  take medicine as told by your doctor. GET HELP RIGHT AWAY IF:   Your pain gets worse.  You have very bad headaches.  You are sick to your stomach (nauseous).  You throw up (vomit).  You are very sleepy (drowsy) all the time.  Your face is puffy (swollen).  Your vision changes.  You have a stiff neck.  You have trouble breathing. MAKE SURE YOU:    Understand these instructions.  Will watch your condition.  Will get help right away if you are not doing well or get worse. Document Released: 09/02/2007 Document Revised: 12/09/2011 Document Reviewed: 10/20/2011 [facility]Exit[facility] Patient Information 2015 [place], [place]. This information is not intended to replace advice given to you by your health care provider. Make sure you discuss any questions you have with your health care provider.

## 2014-03-11 NOTE — ED Notes (Signed)
Patient reports that she slipped and fell last pm onto grass surface. Reports old right tibia fracture in July and now has ongoing right lower leg pain and also wants sinuses checked, reports bloody discharge from same

## 2014-03-11 NOTE — ED Provider Notes (Signed)
CSN: [id number]     Arrival date & time 03/11/14  1111 History   First MD Initiated Contact with Patient 03/11/14 1238     Chief Complaint  Patient presents with  . Fall      HPI  Patient presents for evaluation of altered complaints. #1 she slept last night of some pain in her leg where she fractured her fibula this July. #2 she states she's had bloody nasal discharge and sinus pain and pressure and a morning cough last 4-5 days has history of sinus infections in the past.  Past Medical History  Diagnosis Date  . Hypertension   . Hyperlipidemia   . Depression   . Back pain    Past Surgical History  Procedure Laterality Date  . Cholecystectomy      for gallstones  . Total abdominal hysterectomy      One ovary left  . Tonisillectomy     No family history on file. History  Substance Use Topics  . Smoking status: Never Smoker   . Smokeless tobacco: Never Used  . Alcohol Use: No   OB History    No data available     Review of Systems  Constitutional: Negative for fever, chills, diaphoresis, appetite change and fatigue.  HENT: Positive for congestion, postnasal drip, rhinorrhea and sinus pressure. Negative for mouth sores, sore throat and trouble swallowing.   Eyes: Negative for visual disturbance.  Respiratory: Negative for cough, chest tightness, shortness of breath and wheezing.   Cardiovascular: Negative for chest pain.  Gastrointestinal: Negative for nausea, vomiting, abdominal pain, diarrhea and abdominal distention.  Endocrine: Negative for polydipsia, polyphagia and polyuria.  Genitourinary: Negative for dysuria, frequency and hematuria.  Musculoskeletal: Positive for joint swelling, arthralgias and gait problem.  Skin: Negative for color change, pallor and rash.  Neurological: Negative for dizziness, syncope, light-headedness and headaches.  Hematological: Does not bruise/bleed easily.  Psychiatric/Behavioral: Negative for behavioral problems and confusion.       Allergies  Reglan  Home Medications   Prior to Admission medications   Medication Sig Start Date End Date Taking? Authorizing Provider  amoxicillin (AMOXIL) 500 MG capsule Take 1 capsule (500 mg total) by mouth 3 (three) times daily. 03/11/14 03/25/14  Rolland PorterMark Khole Branch, MD  busPIRone (BUSPAR) 15 MG tablet Take 15 mg by mouth every morning.     Historical Provider, MD  fluticasone (FLONASE) 50 MCG/ACT nasal spray Place 2 sprays into the nose daily.    Historical Provider, MD  methocarbamol (ROBAXIN) 500 MG tablet Take 500 mg by mouth 2 (two) times daily as needed.    Historical Provider, MD  oxyCODONE (OXY IR/ROXICODONE) 5 MG immediate release tablet Take 1 tablet (5 mg total) by mouth every 6 (six) hours as needed for severe pain. 01/22/14   Teressa LowerVrinda Pickering, NP  oxyCODONE (ROXICODONE) 5 MG immediate release tablet Take 1 tablet (5 mg total) by mouth every 4 (four) hours as needed for severe pain. 03/11/14   Rolland PorterMark Sandy Blouch, MD  promethazine (PHENERGAN) 25 MG tablet Take 1 tablet (25 mg total) by mouth every 6 (six) hours as needed for nausea or vomiting. 02/01/14   Teressa LowerVrinda Pickering, NP  triamcinolone (NASACORT AQ) 55 MCG/ACT AERO nasal inhaler Place 2 sprays into the nose daily. 01/22/14   Teressa LowerVrinda Pickering, NP  venlafaxine (EFFEXOR) 37.5 MG tablet Take 37.5 mg by mouth every morning.    Historical Provider, MD   BP 154/84 mmHg  Pulse 57  Temp(Src) 98.5 F (36.9 C)  Resp 18  Ht 5\' 4"  (1.626 m)  Wt 180 lb (81.647 kg)  BMI 30.88 kg/m2  SpO2 100%  LMP  Physical Exam  Constitutional: She is oriented to person, place, and time. She appears well-developed and well-nourished. No distress.  HENT:  Head: Normocephalic.    Ezel congestion. Posterior Franks benign.  Eyes: Conjunctivae are normal. Pupils are equal, round, and reactive to light. No scleral icterus.  Neck: Normal range of motion. Neck supple. No thyromegaly present.  Cardiovascular: Normal rate and regular rhythm.  Exam reveals  no gallop and no friction rub.   No murmur heard. Pulmonary/Chest: Effort normal and breath sounds normal. No respiratory distress. She has no wheezes. She has no rales.  Abdominal: Soft. Bowel sounds are normal. She exhibits no distension. There is no tenderness. There is no rebound.  Musculoskeletal: Normal range of motion.       Legs: Neurological: She is alert and oriented to person, place, and time.  Skin: Skin is warm and dry. No rash noted.  Psychiatric: She has a normal mood and affect. Her behavior is normal.    ED Course  Procedures (including critical care time) Labs Review Labs Reviewed - No data to display  Imaging Review Dg Tibia/fibula Right  03/11/2014   CLINICAL DATA:  Fall 7'15 w/ tistal rt fibula fx, treated w/ "air cast", fall again yesterday w/ increased rt lateral ankle pain  EXAM: RIGHT TIBIA AND FIBULA - 2 VIEW  COMPARISON:  10/07/2013  FINDINGS: Interval bridging callus across the distal fibular shaft fracture, stable alignment. No acute fracture or dislocation. No significant osseous degenerative change. Normal mineralization and alignment. Regional soft tissues unremarkable. Calcaneal spurs at the insertion of the Achilles tendon and plantar aponeurosis.  IMPRESSION: 1. Healing distal fibular fracture.  No acute abnormality.   Electronically Signed   By: Oley Balmaniel  Hassell M.D.   On: 03/11/2014 11:49     EKG Interpretation None      MDM   Final diagnoses:  Acute maxillary sinusitis, recurrence not specified  Vertigo    History treatment for her sinusitis. Antivert for the vertigo. States he has follow-up tomorrow with her pain management physician.    Rolland PorterMark Ciaran Begay, MD 03/11/14 [phone]

## 2018-05-06 ENCOUNTER — Emergency Department (HOSPITAL_COMMUNITY)
Admission: EM | Admit: 2018-05-06 | Discharge: 2018-05-06 | Disposition: A | Discharge status: Home / Self Care | Payer: Self-pay | Attending: Emergency Medicine | Admitting: Emergency Medicine

## 2018-05-06 ENCOUNTER — Other Ambulatory Visit: Payer: Self-pay

## 2018-05-06 ENCOUNTER — Encounter (HOSPITAL_COMMUNITY): Payer: Self-pay | Admitting: Emergency Medicine

## 2018-05-06 DIAGNOSIS — R220 Localized swelling, mass and lump, head: Secondary | ICD-10-CM

## 2018-05-06 DIAGNOSIS — K1120 Sialoadenitis, unspecified: Secondary | ICD-10-CM | POA: Insufficient documentation

## 2018-05-06 DIAGNOSIS — Z79899 Other long term (current) drug therapy: Secondary | ICD-10-CM | POA: Insufficient documentation

## 2018-05-06 DIAGNOSIS — I10 Essential (primary) hypertension: Secondary | ICD-10-CM | POA: Insufficient documentation

## 2018-05-06 DIAGNOSIS — R197 Diarrhea, unspecified: Secondary | ICD-10-CM | POA: Insufficient documentation

## 2018-05-06 HISTORY — DX: Anxiety disorder, unspecified: F41.9

## 2018-05-06 LAB — LACTIC ACID, PLASMA: LACTIC ACID, VENOUS: 1.5 mmol/L (ref 0.5–1.9)

## 2018-05-06 LAB — CBC WITH DIFFERENTIAL/PLATELET
ABS IMMATURE GRANULOCYTES: 0.03 10*3/uL (ref 0.00–0.07)
BASOS PCT: 1 %
Basophils Absolute: 0.1 10*3/uL (ref 0.0–0.1)
Eosinophils Absolute: 0.2 10*3/uL (ref 0.0–0.5)
Eosinophils Relative: 2 %
HCT: 43.3 % (ref 36.0–46.0)
Hemoglobin: 14.5 g/dL (ref 12.0–15.0)
IMMATURE GRANULOCYTES: 0 %
Lymphocytes Relative: 41 %
Lymphs Abs: 3.3 10*3/uL (ref 0.7–4.0)
MCH: 29.5 pg (ref 26.0–34.0)
MCHC: 33.5 g/dL (ref 30.0–36.0)
MCV: 88 fL (ref 80.0–100.0)
Monocytes Absolute: 0.5 10*3/uL (ref 0.1–1.0)
Monocytes Relative: 6 %
NEUTROS PCT: 50 %
Neutro Abs: 4 10*3/uL (ref 1.7–7.7)
Platelets: 355 10*3/uL (ref 150–400)
RBC: 4.92 MIL/uL (ref 3.87–5.11)
RDW: 12.6 % (ref 11.5–15.5)
WBC: 8 10*3/uL (ref 4.0–10.5)
nRBC: 0 % (ref 0.0–0.2)

## 2018-05-06 LAB — COMPREHENSIVE METABOLIC PANEL
ALT: 69 U/L — ABNORMAL HIGH (ref 0–44)
AST: 104 U/L — ABNORMAL HIGH (ref 15–41)
Albumin: 3.8 g/dL (ref 3.5–5.0)
Alkaline Phosphatase: 109 U/L (ref 38–126)
Anion gap: 11 (ref 5–15)
BILIRUBIN TOTAL: 0.3 mg/dL (ref 0.3–1.2)
BUN: 14 mg/dL (ref 6–20)
CO2: 27 mmol/L (ref 22–32)
Calcium: 9.3 mg/dL (ref 8.9–10.3)
Chloride: 104 mmol/L (ref 98–111)
Creatinine, Ser: 0.8 mg/dL (ref 0.44–1.00)
GFR calc Af Amer: >60 mL/min (ref >60)
Glucose, Bld: 121 mg/dL — ABNORMAL HIGH (ref 70–99)
Potassium: 3.5 mmol/L (ref 3.5–5.1)
Sodium: 142 mmol/L (ref 135–145)
TOTAL PROTEIN: 7.2 g/dL (ref 6.5–8.1)

## 2018-05-06 MED ORDER — DICYCLOMINE HCL 20 MG PO TABS: 20.0000 mg | ORAL_TABLET | Freq: Three times a day (TID) | ORAL | 0 refills | Status: DC | PRN

## 2018-05-06 MED ORDER — SODIUM CHLORIDE 0.9% FLUSH: 3.0000 mL | Freq: Once | INTRAVENOUS | Status: DC

## 2018-05-06 MED ORDER — AMOXICILLIN-POT CLAVULANATE 875-125 MG PO TABS: 1.0000 | ORAL_TABLET | Freq: Two times a day (BID) | ORAL | 0 refills | Status: DC

## 2018-05-06 NOTE — ED Triage Notes (Signed)
C/o swelling to R side of face and neck since yesterday.  Increased diarrhea for the last 2 weeks.  Reports fever 99.9 and chills.

## 2018-05-06 NOTE — ED Provider Notes (Signed)
[facility] EMERGENCY DEPARTMENT Provider Note   CSN: [id number] Arrival date & time: 05/06/18  1842     History   Chief Complaint Chief Complaint  Patient presents with  . Abscess  . Fever  . Diarrhea    HPI Sharlot Sturkey is a 48 y.o. female with a PMHx of depression, HTN, HLD, and chronic back pain, with PSHx of cholecystectomy and hysterectomy, who presents to the ED with several complaints.  Her primary complaint is swelling to the right face/cheek/jaw that began yesterday with a knot that developed under the jaw, which today became erythematous and mildly warm to the touch.  She states that she first noticed it yesterday with mostly just swelling, but then the erythema and warmth started today.  She states that she has some rawness to the inside of her cheek on the right side as well as a sore throat.  No known aggravating or alleviating factors, no treatments tried prior to arrival.  She denies any drooling, difficulty swallowing or breathing, trismus, dental pain or issues, drainage from the area, red streaking, or any other complaints related to this.  She denies any recent changes in soaps, detergents, lotions, medications, animal or plant contact, or any exposure to similar issues.  Her second complaint is 1 year of intermittent diarrhea which is been worse over the last 3 weeks, she reports 5-6 episodes daily of nonbloody diarrhea.  She also states that she has intermittent low-grade fevers of 99 but nothing over 100, and none in 3 weeks.  She also states that she has had body aches/joint pain for about a year, again nothing new or different.  She denies any current fevers or chills, chest pain, shortness of breath, abdominal pain, nausea, vomiting, constipation, obstipation, melena, hematochezia, dysuria, hematuria, numbness, tingling, focal weakness, or any other complaints at this time.  The history is provided by the patient and medical records. No language  interpreter was used.  Abscess  Associated symptoms: no fever, no nausea and no vomiting   Fever  Associated symptoms: diarrhea (x52yr), myalgias (x43yr) and sore throat   Associated symptoms: no chest pain, no chills, no confusion, no dysuria, no nausea and no vomiting   Diarrhea  Associated symptoms: arthralgias (x20yr) and myalgias (x62yr)   Associated symptoms: no abdominal pain, no chills, no fever and no vomiting     Past Medical History:  Diagnosis Date  . Anxiety   . Back pain   . Depression   . Hyperlipidemia   . Hypertension     Patient Active Problem List   Diagnosis Date Noted  . Syncope 01/25/2013  . Hypertension 09/02/2011  . Depression 09/02/2011  . Hyperlipidemia 09/02/2011  . Abdominal pain 09/02/2011    Past Surgical History:  Procedure Laterality Date  . CHOLECYSTECTOMY     for gallstones  . Tonisillectomy    . TOTAL ABDOMINAL HYSTERECTOMY     One ovary left     OB History   No obstetric history on file.      Home Medications    Prior to Admission medications   Medication Sig Start Date End Date Taking? Authorizing Provider  busPIRone (BUSPAR) 15 MG tablet Take 15 mg by mouth every morning.     [provider]  fluticasone (FLONASE) 50 MCG/ACT nasal spray Place 2 sprays into the nose daily.    [provider]  methocarbamol (ROBAXIN) 500 MG tablet Take 500 mg by mouth 2 (two) times daily as needed.  [provider]  oxyCODONE (OXY IR/ROXICODONE) 5 MG immediate release tablet Take 1 tablet (5 mg total) by mouth every 6 (six) hours as needed for severe pain. 01/22/14   Teressa LowerPickering, Vrinda, NP  oxyCODONE (ROXICODONE) 5 MG immediate release tablet Take 1 tablet (5 mg total) by mouth every 4 (four) hours as needed for severe pain. 03/11/14   Rolland PorterJames, Mark, MD  promethazine (PHENERGAN) 25 MG tablet Take 1 tablet (25 mg total) by mouth every 6 (six) hours as needed for nausea or vomiting. 02/01/14   Teressa LowerPickering, Vrinda, NP    triamcinolone (NASACORT AQ) 55 MCG/ACT AERO nasal inhaler Place 2 sprays into the nose daily. 01/22/14   Teressa LowerPickering, Vrinda, NP  venlafaxine (EFFEXOR) 37.5 MG tablet Take 37.5 mg by mouth every morning.    [provider]    Family History No family history on file.  Social History Social History   Tobacco Use  . Smoking status: Never Smoker  . Smokeless tobacco: Never Used  Substance Use Topics  . Alcohol use: No  . Drug use: No     Allergies   Reglan [metoclopramide]   Review of Systems Review of Systems  Constitutional: Negative for chills and fever.  HENT: Positive for facial swelling and sore throat. Negative for dental problem, drooling and trouble swallowing.   Respiratory: Negative for shortness of breath.   Cardiovascular: Negative for chest pain.  Gastrointestinal: Positive for diarrhea (x1473yr). Negative for abdominal pain, blood in stool, constipation, nausea and vomiting.  Genitourinary: Negative for dysuria and hematuria.  Musculoskeletal: Positive for arthralgias (x8873yr) and myalgias (x4073yr).  Skin: Positive for color change.  Allergic/Immunologic: Negative for immunocompromised state.  Neurological: Negative for weakness and numbness.  Psychiatric/Behavioral: Negative for confusion.   All other systems reviewed and are negative for acute change except as noted in the HPI.    Physical Exam Updated Vital Signs BP (!) 143/98 (BP Location: Right Arm)   Pulse 90   Temp 97.8 F (36.6 C) (Oral)   Resp 20   SpO2 99%   Physical Exam Vitals signs and nursing note reviewed.  Constitutional:      General: She is not in acute distress.    Appearance: Normal appearance. She is well-developed. She is not toxic-appearing.     Comments: Afebrile, nontoxic, NAD  HENT:     Head: Normocephalic and atraumatic.     Mouth/Throat:     Mouth: Mucous membranes are dry.     Dentition: No dental caries or dental abscesses.     Pharynx: Oropharynx is clear. Uvula  midline. No pharyngeal swelling, oropharyngeal exudate, posterior oropharyngeal erythema or uvula swelling.     Tonsils: No tonsillar exudate or tonsillar abscesses.     Comments: Dry lips, some cracking to R mouth corner.  Soft swollen area to submental region without fluctuance, mildly erythematous overlying the area, no warmth, with mild TTP to this area and tracking towards the R submandibular gland which also has some mild swelling and TTP. Mild erythema and tenderness to wharton's duct. Underside of tongue and mouth otherwise without induration or evidence of ludwig's. Posterior oropharynx clear without tonsillar swelling, no uvular swelling or deviation. No evidence of PTA. SEE PICTURE BELOW Eyes:     General:        Right eye: No discharge.        Left eye: No discharge.     Conjunctiva/sclera: Conjunctivae normal.  Neck:     Musculoskeletal: Normal range of motion and neck supple.  Cardiovascular:     Rate and Rhythm: Normal rate and regular rhythm.     Pulses: Normal pulses.     Heart sounds: Normal heart sounds, S1 normal and S2 normal. No murmur. No friction rub. No gallop.   Pulmonary:     Effort: Pulmonary effort is normal. No respiratory distress.     Breath sounds: Normal breath sounds. No decreased breath sounds, wheezing, rhonchi or rales.  Abdominal:     General: Bowel sounds are normal. There is no distension.     Palpations: Abdomen is soft. Abdomen is not rigid.     Tenderness: There is no abdominal tenderness. There is no right CVA tenderness, left CVA tenderness, guarding or rebound. Negative signs include Murphy's sign and McBurney's sign.     Comments: Soft, NTND, +BS throughout, no r/g/r, neg murphy's, neg mcburney's, no CVA TTP   Musculoskeletal: Normal range of motion.  Skin:    General: Skin is warm and dry.     Findings: Erythema present. No rash.     Comments: Neck erythema as mentioned above and pictured below  Neurological:     Mental Status: She is  alert and oriented to person, place, and time.     Sensory: Sensation is intact. No sensory deficit.     Motor: Motor function is intact.  Psychiatric:        Mood and Affect: Mood and affect normal.        Behavior: Behavior normal.        ED Treatments / Results  Labs (all labs ordered are listed, but only abnormal results are displayed) Labs Reviewed  COMPREHENSIVE METABOLIC PANEL - Abnormal; Notable for the following components:      Result Value   Glucose, Bld 121 (*)    AST 104 (*)    ALT 69 (*)    All other components within normal limits  LACTIC ACID, PLASMA  CBC WITH DIFFERENTIAL/PLATELET    EKG None  Radiology No results found.  Procedures Procedures (including critical care time)  Medications Ordered in ED Medications  sodium chloride flush (NS) 0.9 % injection 3 mL (has no administration in time range)     Initial Impression / Assessment and Plan / ED Course  I have reviewed the triage vital signs and the nursing notes.  Pertinent labs & imaging results that were available during my care of the patient were reviewed by me and considered in my medical decision making (see chart for details).  Clinical Course as of May 06 2117  Fri May 06, 2018  [id number] 48 year old female with some new facial swelling over the last couple of days mostly under her right jaw and then under the midline of the proximal neck.  There is a little bit of fullness to it but no fluctuance.  There is no intraoral swelling no poor dentition and underneath the tongue is soft.  This is likely some sort of superficial infection potentially brachial cleft cyst.  I think it be reasonable to cover with some antibiotics and refer on to ENT.   [MB]    Clinical Course User Index [MB] Terrilee FilesButler, Michael C, MD    48 y.o. female here with R facial swelling and knot under jaw that began yesterday and started having erythema today, also feels rawness to inner cheeks, and sore throat. Also reports  diarrhea x6566yr and joint pain x4066yr. On exam, throat clear, mild tenderness and erythema are wharton's duct, swelling to submental region with erythema and tenderness  which tracks towards the R submandibular gland, no fluctuance, no evidence of Ludwig's. No abdominal tenderness, otherwise well appearing. Work up thus far reveals: lactic WNL, CMP with mildly elevated AST/ALT but otherwise unremarkable, CBC WNL. Suspect sialoadenitis vs brachial cleft cyst. Doubt need for I&D of this area. Will send home with augmentin to cover for infection, and have her f/up with ENT. Regarding her other symptoms, doubt acute etiology or need for further emergent work up, could be functional diarrhea, advised f/up with her PCP/[facility] for this. BRAT diet advised. Rx for bentyl given. Discussed other OTC remedies for symptomatic relief.  F/up with PCP/[facility] in 1wk for recheck of this. I explained the diagnosis and have given explicit precautions to return to the ER including for any other new or worsening symptoms. The patient understands and accepts the medical plan as it's been dictated and I have answered their questions. Discharge instructions concerning home care and prescriptions have been given. The patient is STABLE and is discharged to home in good condition.    Final Clinical Impressions(s) / ED Diagnoses   Final diagnoses:  Sialoadenitis  Jaw swelling  Diarrhea, unspecified type    ED Discharge Orders         Ordered    dicyclomine (BENTYL) 20 MG tablet  3 times daily with meals PRN     05/06/18 2130    amoxicillin-clavulanate (AUGMENTIN) 875-125 MG tablet  2 times daily     05/06/18 [address], [place], [place] 05/06/18 2131    Terrilee Files, MD 05/06/18 [phone]

## 2018-05-06 NOTE — Discharge Instructions (Addendum)
You have what appears to be an infection in the glands under your jaw. Take antibiotic as directed until completed. Alternate between tylenol and motrin as needed for pain. Follow up with the ENT doctor in 1 week for recheck of symptoms and ongoing management of this finding.   As for your diarrhea, alternate between tylenol and ibuprofen as needed for any pain. Use bentyl as directed as needed for diarrhea and abdominal cramping. May consider using over the counter tums, maalox, pepto bismol, or other over the counter remedies to help with symptoms. Stay well hydrated with small sips of fluids throughout the day. Follow a BRAT (banana-rice-applesauce-toast) diet as described below for the next 24-48 hours. The 'BRAT' diet is suggested, then progress to diet as tolerated as symptoms abate. Call your regular doctor if bloody stools, persistent diarrhea, vomiting, fever or abdominal pain. Follow up with your regular doctor  or the [facility] in 1 week for recheck of symptoms. Return to ER for changing or worsening of symptoms.

## 2018-05-06 NOTE — ED Notes (Signed)
Pt very unhappy with her care here.  Left unhappy

## 2021-05-23 ENCOUNTER — Other Ambulatory Visit: Payer: Self-pay | Admitting: Family Medicine

## 2021-05-23 ENCOUNTER — Ambulatory Visit
Admission: RE | Admit: 2021-05-23 | Discharge: 2021-05-23 | Disposition: A | Discharge status: Home / Self Care | Payer: Medicaid Other | Source: Ambulatory Visit | Attending: Family Medicine | Admitting: Family Medicine

## 2021-05-23 DIAGNOSIS — M25561 Pain in right knee: Secondary | ICD-10-CM

## 2021-05-23 DIAGNOSIS — M25361 Other instability, right knee: Secondary | ICD-10-CM

## 2022-07-21 IMAGING — CR DG KNEE COMPLETE 4+V*R*
3 series · 3 of 3 positions shown · non-contrast
Comparison: Right knee radiographs 01/24/2015

CLINICAL DATA: Right knee pain and instability x 2-3 weeks.

EXAM:
RIGHT KNEE - COMPLETE 4+ VIEW

[w knee ap right (1 of 2)]
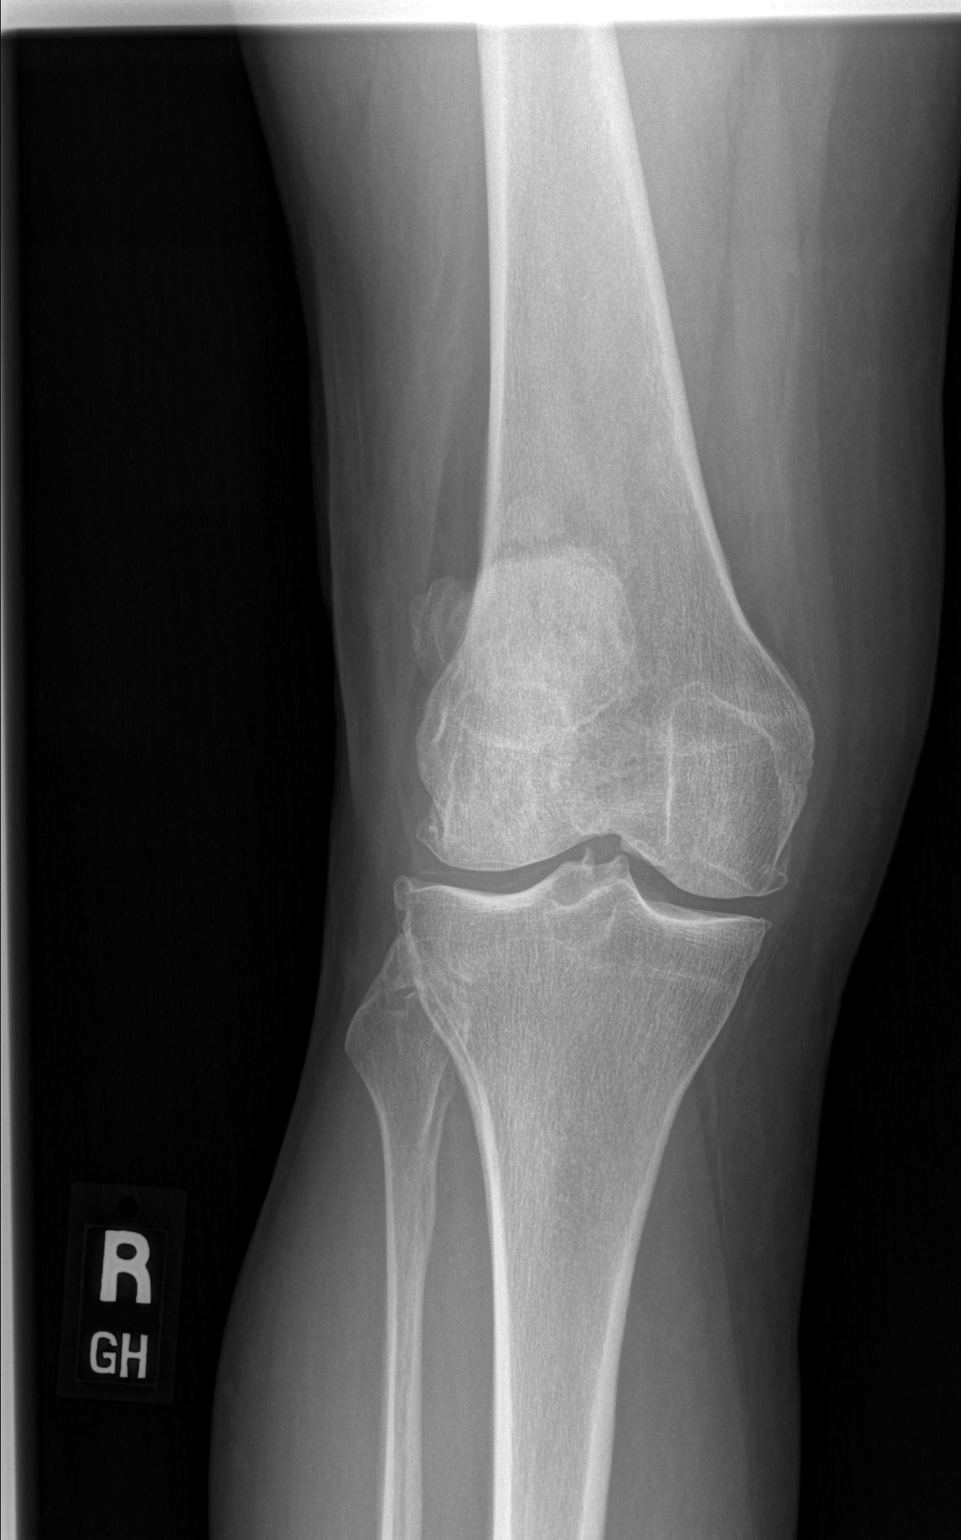

[w knee lat. right]
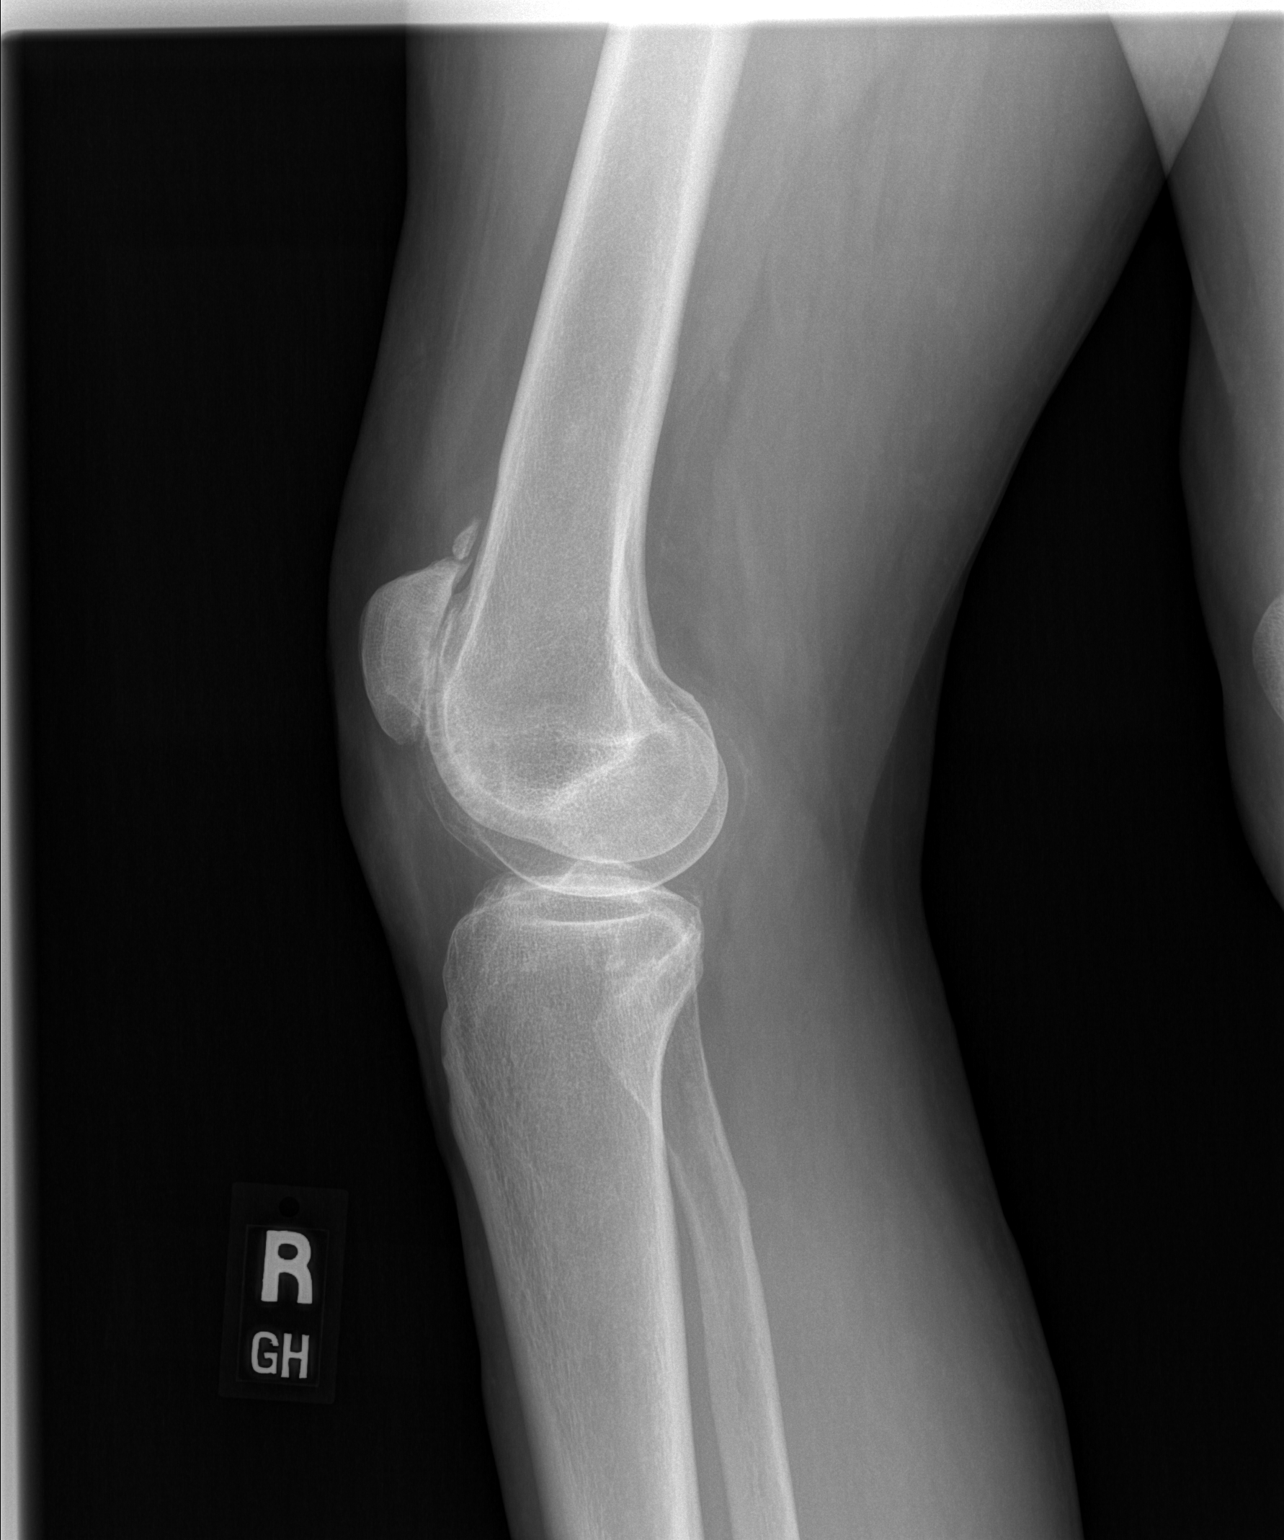

[w knee ap right (2 of 2)]
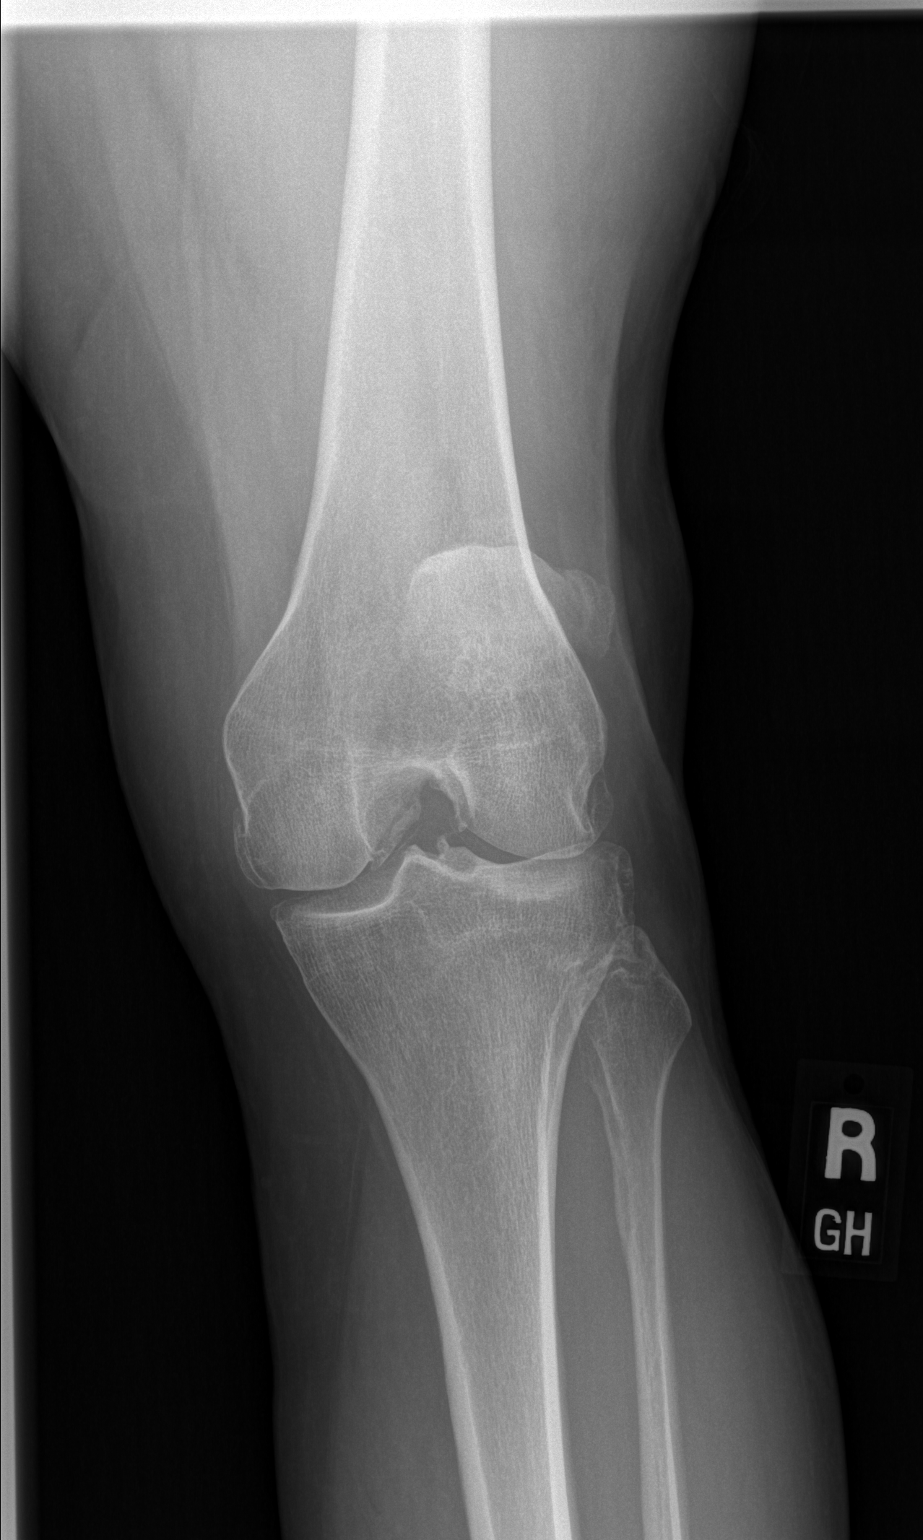

[3 of 3 positions shown; findings below may reference images not displayed]

FINDINGS: Severe lateral patellofemoral joint space narrowing and peripheral
osteophytosis. Mild medial compartment joint space narrowing.
Moderate peripheral medial and lateral compartment degenerative
osteophytes. There is a chronic 8 mm ossicle just superior to the
patella on lateral view, possibly a fragmented degenerative spur. No
joint effusion. No acute fracture or dislocation.
IMPRESSION: Severe patellofemoral and mild-to-moderate medial and lateral
compartment osteoarthritis.

## 2022-11-18 ENCOUNTER — Encounter (HOSPITAL_COMMUNITY): Payer: Self-pay

## 2022-11-18 ENCOUNTER — Other Ambulatory Visit: Payer: Self-pay

## 2022-11-18 ENCOUNTER — Observation Stay (HOSPITAL_COMMUNITY)
Admission: EM | Admit: 2022-11-18 | Discharge: 2022-11-20 | Disposition: A | Discharge status: Home / Self Care | Payer: Medicaid Other | Attending: Internal Medicine | Admitting: Internal Medicine

## 2022-11-18 DIAGNOSIS — I10 Essential (primary) hypertension: Secondary | ICD-10-CM | POA: Diagnosis present

## 2022-11-18 DIAGNOSIS — N179 Acute kidney failure, unspecified: Secondary | ICD-10-CM | POA: Diagnosis not present

## 2022-11-18 DIAGNOSIS — E785 Hyperlipidemia, unspecified: Secondary | ICD-10-CM | POA: Diagnosis present

## 2022-11-18 DIAGNOSIS — R42 Dizziness and giddiness: Secondary | ICD-10-CM | POA: Diagnosis present

## 2022-11-18 DIAGNOSIS — M3500 Sicca syndrome, unspecified: Secondary | ICD-10-CM

## 2022-11-18 DIAGNOSIS — F32A Depression, unspecified: Secondary | ICD-10-CM

## 2022-11-18 DIAGNOSIS — Z79899 Other long term (current) drug therapy: Secondary | ICD-10-CM | POA: Diagnosis not present

## 2022-11-18 DIAGNOSIS — E039 Hypothyroidism, unspecified: Secondary | ICD-10-CM

## 2022-11-18 LAB — BASIC METABOLIC PANEL
Anion gap: 11 (ref 5–15)
BUN: 25 mg/dL — ABNORMAL HIGH (ref 6–20)
CO2: 21 mmol/L — ABNORMAL LOW (ref 22–32)
Calcium: 9.2 mg/dL (ref 8.9–10.3)
Chloride: 107 mmol/L (ref 98–111)
Creatinine, Ser: 1.67 mg/dL — ABNORMAL HIGH (ref 0.44–1.00)
GFR, Estimated: 37 mL/min — ABNORMAL LOW (ref >60)
Glucose, Bld: 128 mg/dL — ABNORMAL HIGH (ref 70–99)
Potassium: 4.1 mmol/L (ref 3.5–5.1)
Sodium: 139 mmol/L (ref 135–145)

## 2022-11-18 LAB — URINALYSIS, ROUTINE W REFLEX MICROSCOPIC
Bacteria, UA: NONE SEEN
Bilirubin Urine: NEGATIVE
Glucose, UA: NEGATIVE mg/dL
Hgb urine dipstick: NEGATIVE
Ketones, ur: NEGATIVE mg/dL
Nitrite: NEGATIVE
Protein, ur: 30 mg/dL — AB
Specific Gravity, Urine: 1.02 (ref 1.005–1.030)
WBC, UA: >50 WBC/hpf (ref 0–5)
pH: 5 (ref 5.0–8.0)

## 2022-11-18 LAB — CBC
HCT: 45.4 % (ref 36.0–46.0)
Hemoglobin: 14.7 g/dL (ref 12.0–15.0)
MCH: 29.6 pg (ref 26.0–34.0)
MCHC: 32.4 g/dL (ref 30.0–36.0)
MCV: 91.3 fL (ref 80.0–100.0)
Platelets: 332 10*3/uL (ref 150–400)
RBC: 4.97 MIL/uL (ref 3.87–5.11)
RDW: 12.4 % (ref 11.5–15.5)
WBC: 10 10*3/uL (ref 4.0–10.5)
nRBC: 0 % (ref 0.0–0.2)

## 2022-11-18 LAB — CBG MONITORING, ED: Glucose-Capillary: 130 mg/dL — ABNORMAL HIGH (ref 70–99)

## 2022-11-18 MED ORDER — SODIUM CHLORIDE 0.9 % IV BOLUS
1000.0000 mL | Freq: Once | INTRAVENOUS | Status: AC
  Administered 2022-11-19: 1000 mL via INTRAVENOUS

## 2022-11-18 NOTE — ED Triage Notes (Signed)
Dizziness, weakness, feeling like heart is fluttering since this morning when pt woke up. Pt also has hand cramping. Pt went to another ER 4 days ago and potassium and magnesium were low. Pt has been taking potassium supplement, but unable to get magnesium supplement due to insurance. Denies chest pain

## 2022-11-19 DIAGNOSIS — M3500 Sicca syndrome, unspecified: Secondary | ICD-10-CM | POA: Diagnosis not present

## 2022-11-19 DIAGNOSIS — I10 Essential (primary) hypertension: Secondary | ICD-10-CM

## 2022-11-19 DIAGNOSIS — F32A Depression, unspecified: Secondary | ICD-10-CM | POA: Diagnosis not present

## 2022-11-19 DIAGNOSIS — E039 Hypothyroidism, unspecified: Secondary | ICD-10-CM

## 2022-11-19 DIAGNOSIS — N179 Acute kidney failure, unspecified: Secondary | ICD-10-CM | POA: Diagnosis not present

## 2022-11-19 DIAGNOSIS — F419 Anxiety disorder, unspecified: Secondary | ICD-10-CM | POA: Diagnosis not present

## 2022-11-19 DIAGNOSIS — Z79899 Other long term (current) drug therapy: Secondary | ICD-10-CM | POA: Diagnosis not present

## 2022-11-19 DIAGNOSIS — R42 Dizziness and giddiness: Secondary | ICD-10-CM | POA: Diagnosis present

## 2022-11-19 LAB — MAGNESIUM: Magnesium: 1.9 mg/dL (ref 1.7–2.4)

## 2022-11-19 MED ORDER — ARIPIPRAZOLE 5 MG PO TABS
5.0000 mg | ORAL_TABLET | Freq: Every day | ORAL | Status: DC
  Administered 2022-11-19 – 2022-11-20 (×2): 5 mg via ORAL
  Filled 2022-11-19 (×2): qty 1

## 2022-11-19 MED ORDER — HEPARIN SODIUM (PORCINE) 5000 UNIT/ML IJ SOLN
5000.0000 [IU] | Freq: Three times a day (TID) | INTRAMUSCULAR | Status: DC
  Filled 2022-11-19 (×3): qty 1

## 2022-11-19 MED ORDER — VENLAFAXINE HCL ER 150 MG PO CP24
150.0000 mg | ORAL_CAPSULE | Freq: Every day | ORAL | Status: DC
  Administered 2022-11-19 – 2022-11-20 (×2): 150 mg via ORAL
  Filled 2022-11-19 (×3): qty 1

## 2022-11-19 MED ORDER — ACETAMINOPHEN 650 MG RE SUPP: 650.0000 mg | Freq: Four times a day (QID) | RECTAL | Status: DC | PRN

## 2022-11-19 MED ORDER — SODIUM CHLORIDE 0.9 % IV SOLN: INTRAVENOUS | Status: AC

## 2022-11-19 MED ORDER — SODIUM CHLORIDE 0.9 % IV BOLUS
1500.0000 mL | Freq: Once | INTRAVENOUS | Status: AC
  Administered 2022-11-19: 1500 mL via INTRAVENOUS

## 2022-11-19 MED ORDER — OXYCODONE-ACETAMINOPHEN 5-325 MG PO TABS
1.0000 | ORAL_TABLET | Freq: Four times a day (QID) | ORAL | Status: DC | PRN
  Administered 2022-11-19 – 2022-11-20 (×2): 1 via ORAL
  Filled 2022-11-19 (×3): qty 1

## 2022-11-19 MED ORDER — PROMETHAZINE HCL 12.5 MG RE SUPP: 12.5000 mg | Freq: Four times a day (QID) | RECTAL | Status: DC | PRN

## 2022-11-19 MED ORDER — OXYCODONE-ACETAMINOPHEN 10-300 MG PO TABS: 1.0000 | ORAL_TABLET | Freq: Four times a day (QID) | ORAL | Status: DC | PRN

## 2022-11-19 MED ORDER — SODIUM CHLORIDE 0.9 % IV SOLN: 12.5000 mg | Freq: Four times a day (QID) | INTRAVENOUS | Status: DC | PRN

## 2022-11-19 MED ORDER — LACTATED RINGERS IV BOLUS
500.0000 mL | Freq: Once | INTRAVENOUS | Status: AC
  Administered 2022-11-19: 500 mL via INTRAVENOUS

## 2022-11-19 MED ORDER — ACETAMINOPHEN 325 MG PO TABS: 650.0000 mg | ORAL_TABLET | Freq: Four times a day (QID) | ORAL | Status: DC | PRN

## 2022-11-19 MED ORDER — ONDANSETRON HCL 4 MG/2ML IJ SOLN
INTRAMUSCULAR | Status: AC
  Filled 2022-11-19: qty 2

## 2022-11-19 MED ORDER — PROMETHAZINE HCL 25 MG PO TABS
12.5000 mg | ORAL_TABLET | Freq: Four times a day (QID) | ORAL | Status: DC | PRN
  Administered 2022-11-19: 12.5 mg via ORAL
  Filled 2022-11-19: qty 1

## 2022-11-19 MED ORDER — LEVOTHYROXINE SODIUM 88 MCG PO TABS
88.0000 ug | ORAL_TABLET | Freq: Every day | ORAL | Status: DC
  Administered 2022-11-19 – 2022-11-20 (×2): 88 ug via ORAL
  Filled 2022-11-19 (×3): qty 1

## 2022-11-19 MED ORDER — LEVETIRACETAM 500 MG PO TABS
1000.0000 mg | ORAL_TABLET | Freq: Two times a day (BID) | ORAL | Status: DC
  Administered 2022-11-19 – 2022-11-20 (×3): 1000 mg via ORAL
  Filled 2022-11-19 (×3): qty 2

## 2022-11-19 MED ORDER — LACTATED RINGERS IV SOLN: INTRAVENOUS | Status: DC

## 2022-11-19 MED ORDER — BUSPIRONE HCL 10 MG PO TABS
10.0000 mg | ORAL_TABLET | Freq: Two times a day (BID) | ORAL | Status: DC
  Administered 2022-11-19 – 2022-11-20 (×3): 10 mg via ORAL
  Filled 2022-11-19 (×3): qty 1

## 2022-11-19 MED ORDER — ONDANSETRON HCL 4 MG/2ML IJ SOLN
4.0000 mg | Freq: Once | INTRAMUSCULAR | Status: AC
  Administered 2022-11-19: 4 mg via INTRAVENOUS

## 2022-11-19 MED ORDER — GABAPENTIN 100 MG PO CAPS: 200.0000 mg | ORAL_CAPSULE | Freq: Three times a day (TID) | ORAL | Status: DC

## 2022-11-19 MED ORDER — ONDANSETRON HCL 4 MG/2ML IJ SOLN: 4.0000 mg | Freq: Four times a day (QID) | INTRAMUSCULAR | Status: DC | PRN

## 2022-11-19 MED ORDER — OXYCODONE HCL 5 MG PO TABS
5.0000 mg | ORAL_TABLET | Freq: Four times a day (QID) | ORAL | Status: DC | PRN
  Administered 2022-11-19: 5 mg via ORAL
  Filled 2022-11-19: qty 1

## 2022-11-19 NOTE — Progress Notes (Signed)
[facility] PROGRESS NOTE    Progress Note  Alisha Webster  BJY:[id number] DOB: [birth year] DOA: 11/18/2022 PCP: Maye Hides, PA     Brief Narrative:   Alisha Webster is an 52 y.o. female past medical history of hypothyroidism, essential hypertension, seizures, homelessness for 2 days relates she started having dizziness a day prior to admission with near syncopal episodes.  In the ED she started having nausea and diarrhea denies any abdominal pain relates she is taking all of her medication as before Synthroid.  Assessment/Plan:   AKI (acute kidney injury) : With a baseline creatinine of less than 1, on admission 1.6. In the setting of hydrochlorothiazide nausea, decreased oral intake and diarrhea. Started on aggressive IV fluid hydration recheck basic metabolic panel in the morning. Hold hydrochlorothiazide replete electrolytes as needed. She is being transferred to [facility] due to no bed availability at [facility]  Orthostatic hypotension: She relates very nicely that she is lightheaded upon standing and it resolves when she lays still in the bed. Hold Robaxin and Neurontin which since they tend to accumulate in acute kidney injury. Patient has no neurodeficits on physical exam. Unlikely CVA.  I think she is orthostatic from nausea vomiting and diarrhea. Will go ahead and give her a bolus of normal saline continue IV fluids continue hold antihypertensive medication and reevaluate in the morning.  Diarrhea and abdominal pain.: She has had 3 bowel movements while in the ED she states they are watery. Check her GI panel. She has remained afebrile with no leukocytosis. Contact precautions.  Essential hypertension Hold all antihypertensive medication.  Continue to monitor blood pressure.  Hyperlipidemia Continue statins.  Anxiety and depression Resume home medication.  Hypothyroidism Continue Synthroid.  Seizure disorder: Continue Keppra.     DVT  prophylaxis: lovenox Family Communication:None Status is: Observation The patient remains OBS appropriate and will d/c before 2 midnights.  Being transferred from [place] [facility] to [facility] as there are no bed available at [facility].    Code Status:     Code Status Orders  (From admission, onward)           Start     Ordered   11/19/22 0537  Full code  Continuous       Question:  By:  Answer:  Consent: discussion documented in EHR   11/19/22 0537           Code Status History     This patient has a current code status but no historical code status.         IV Access:   Peripheral IV   Procedures and diagnostic studies:   No results found.   Medical Consultants:   None.   Subjective:    [name] she relates every time she stands up she gets dizzy does not lose consciousness has not hit her head.  Objective:    Vitals:   11/18/22 2345 11/19/22 0038 11/19/22 0247 11/19/22 0520  BP: 126/73 (!) 119/90 108/71 (!) 153/98  Pulse: 95 84 87 73  Resp: 17  16 18   Temp:  97.9 F (36.6 C)  97.8 F (36.6 C)  TempSrc:  Oral  Oral  SpO2: 98% 97% 97% 99%  Weight:      Height:       SpO2: 99 %   Intake/Output Summary (Last 24 hours) at 11/19/2022 0741 Last data filed at 11/19/2022 0251 Gross per 24 hour  Intake 999 ml  Output --  Net 999  ml   Filed Weights   11/18/22 2039  Weight: 77.1 kg    Exam: General exam: In no acute distress, dry mucous membrane Respiratory system: Good air movement and clear to auscultation. Cardiovascular system: S1 & S2 heard, RRR. No JVD. Gastrointestinal system: Abdomen is nondistended, soft and nontender.  Central nervous system: Awake alert and oriented x 4 no focal deficits no asterixis. Extremities: No pedal edema. Skin: No rashes, lesions or ulcers Psychiatry: Judgement and insight appear normal. Mood & affect appropriate.    Data Reviewed:    Labs: Basic Metabolic Panel: Recent Labs   Lab 11/18/22 0016 11/18/22 2101  NA  --  139  K  --  4.1  CL  --  107  CO2  --  21*  GLUCOSE  --  128*  BUN  --  25*  CREATININE  --  1.67*  CALCIUM  --  9.2  MG 1.9  --    GFR Estimated Creatinine Clearance: 40.1 mL/min (A) (by C-G formula based on SCr of 1.67 mg/dL (H)). Liver Function Tests: No results for input(s): "AST", "ALT", "ALKPHOS", "BILITOT", "PROT", "ALBUMIN" in the last 168 hours. No results for input(s): "LIPASE", "AMYLASE" in the last 168 hours. No results for input(s): "AMMONIA" in the last 168 hours. Coagulation profile No results for input(s): "INR", "PROTIME" in the last 168 hours. COVID-19 Labs  No results for input(s): "DDIMER", "FERRITIN", "LDH", "CRP" in the last 72 hours.  No results found for: "SARSCOV2NAA"  CBC: Recent Labs  Lab 11/18/22 2101  WBC 10.0  HGB 14.7  HCT 45.4  MCV 91.3  PLT 332   Cardiac Enzymes: No results for input(s): "CKTOTAL", "CKMB", "CKMBINDEX", "TROPONINI" in the last 168 hours. BNP (last 3 results) No results for input(s): "PROBNP" in the last 8760 hours. CBG: Recent Labs  Lab 11/18/22 2256  GLUCAP 130*   D-Dimer: No results for input(s): "DDIMER" in the last 72 hours. Hgb A1c: No results for input(s): "HGBA1C" in the last 72 hours. Lipid Profile: No results for input(s): "CHOL", "HDL", "LDLCALC", "TRIG", "CHOLHDL", "LDLDIRECT" in the last 72 hours. Thyroid function studies: No results for input(s): "TSH", "T4TOTAL", "T3FREE", "THYROIDAB" in the last 72 hours.  Invalid input(s): "FREET3" Anemia work up: No results for input(s): "VITAMINB12", "FOLATE", "FERRITIN", "TIBC", "IRON", "RETICCTPCT" in the last 72 hours. Sepsis Labs: Recent Labs  Lab 11/18/22 2101  WBC 10.0   Microbiology No results found for this or any previous visit (from the past 240 hour(s)).   Medications:    ARIPiprazole  5 mg Oral Daily   busPIRone  10 mg Oral BID   gabapentin  200 mg Oral TID   heparin  5,000 Units  Subcutaneous Q8H   levETIRAcetam  1,000 mg Oral BID   levothyroxine  88 mcg Oral Q0600   venlafaxine XR  150 mg Oral Daily   Continuous Infusions:  lactated ringers 125 mL/hr at 11/19/22 0526      LOS: 0 days   Marinda Elk  [facility]  11/19/2022, 7:41 AM

## 2022-11-19 NOTE — Progress Notes (Signed)
Pt admitted from [facility] ED for palpitations,weakness, some mild lightheadedness/ syncope, n/v and diarrhea.  She is on contact isolation for r/o C.diff and is pending a GI panel.  Pt was transported via gurney accompanied by 3 transporters.  Assisted pt into her room and bed.  She was observed ambulating with a steady gait.  Reviewed POC and made pt aware of her surroundings.  Obtained VS.  Answered any pending questions.  Pt had no further questions.  Instructed pt to utilize RN call light for assistance.  Bed in lowest position, locked with two upper side rails engaged. Belongings and call light within reach.

## 2022-11-19 NOTE — Plan of Care (Signed)
Problem: Education: Goal: Knowledge of General Education information will improve Description: Including pain rating scale, medication(s)/side effects and non-pharmacologic comfort measures Outcome: Progressing Pt understands she was admitted into the hospital for palpitations,weakness, some mild lightheadedness/ syncope, n/v and diarrhea.  She is on contact isolation for r/o C.diff and is pending a GI panel.   Problem: Clinical Measurements: Goal: Ability to maintain clinical measurements within normal limits will improve Outcome: Progressing VS WNL thus far except for her BP which was elevated upon arriving to the unit.  It did improve over the shift when her n/v and pain was managed.     Problem: Clinical Measurements: Goal: Will remain free from infection Outcome: Progressing S/Sx of infection monitored and assessed q-shift.  Pt has remained afebrile thus far.  She is on contact isolation for r/o C.diff and is pending a GI panel.      Problem: Clinical Measurements: Goal: Respiratory complications will improve Outcome: Progressing Respiratory status monitored and assessed q-shift.  Pt is on room air with PO2 saturations at 99% and respiration rate of 18 breaths per minute.  She has not endorsed c/o SOE or DOE.     Problem: Clinical Measurements: Goal: Cardiovascular complication will be avoided Outcome: Progressing Pt's HR have been WNL except for her BP which was elevated upon arriving to the unit.  It did improve over the shift when her n/v and pain was managed.  She has not endorsed c/o SOE/ DOE or CP/ palpation upon arrival to the unit even though she was admitted into the hospital for palpitations,weakness, some mild lightheadedness/ syncope, n/v and diarrhea.      Problem: Activity: Goal: Risk for activity intolerance will decrease Outcome: Progressing Pt has been OOB to the bathroom several times independently. She was observed walking with a steady gait.         Problem:  Nutrition: Goal: Adequate nutrition will be maintained Outcome: Progressing Pt is on heart healthy diet per MD's orders.  She has been able to eat 50-100% of her meals thus far.  Pt continues to endorse c/o n/v and abdominal pain/ distention.    Problem: Elimination: Goal: Will not experience complications related to bowel motility Outcome: Progressing Pt has not endorse c/o constipation.  Her LBM was on 11/19/2022.  She continuous to have numerous amounts of diarrhea.  Pt is on contact isolation for r/o C.diff and is pending a GI panel.          Problem: Elimination: Goal: Will not experience complications related to urinary retention Outcome: Progressing Pt has not endorsed dysuria, abdominal pain/ distention thus far.    Problem: Pain Managment: Goal: General experience of comfort will improve Outcome: Progressing Pt has endorsed c/o 4-10/10 bilateral abdominal pain describing it as a constant pain.  Reiterated pain scale so she could adequately rate her pain.  Pt stated her pain goal this admission would be 0/10.  Discussed nonpharmacological methods to help reduce s/sx of pain.  Interventions given per pt's request and MD's orders.    Problem: Safety: Goal: Ability to remain free from injury will improve Outcome: Progressing Pt has remained free from falls thus far. Instructed pt to utilize RN call light for assistance. Hourly rounds performed. Bed alarm implemented to keep pt safe from falls. Settings activated to third most sensitive mode. Bed in lowest position, locked with two upper side rails engaged. Belongings and call light within reach.    Problem: Skin Integrity: Goal: Risk for impaired skin integrity will decrease Outcome: Progressing  Skin integrity monitored and assessed q-shift. Instructed pt to turn q2 hours to prevent further skin impairment.  Tubes and drains assessed for device related pressure sores.  Pt is continent of both bowel and bladder.

## 2022-11-19 NOTE — ED Notes (Signed)
Pt ambulatory to restroom without assistance and with a steady gait.

## 2022-11-19 NOTE — Progress Notes (Addendum)
PT Cancellation Note  Patient Details Name: Alisha Webster MRN: [id number] DOB: 1970/11/28   Cancelled Treatment:    Reason Eval/Treat Not Completed: Patient declined  PT evaluation at this time. Reports she is nauseated and just received nausea medication. Will check back as schedule allows to initiate PT eval.   Addendum: Checked on pt at 1208 and pt continued to decline PT evaluation.    Marylynn Pearson 11/19/2022, 11:35 AM  Conni Slipper, PT, DPT Acute Rehabilitation Services Secure Chat Preferred Office: [phone]

## 2022-11-19 NOTE — ED Provider Notes (Signed)
EMERGENCY DEPARTMENT AT [facility] Provider Note   CSN: [id number] Arrival date & time: 11/18/22  2013     History  Chief Complaint  Patient presents with   Dizziness    Alisha Webster is a 52 y.o. female.  Patient with past medical history significant for hypertension, depression, syncope presents to the emergency department complaining of palpitations and weakness that began this morning upon awakening.  She also complains of some mild lightheadedness when she stands.  She states she went to an outside emergency department 4 days ago and that her potassium and magnesium were low.  She states she has been taking potassium supplementation but due to insurance issues has not been taking magnesium.  She denies chest pain, shortness of breath, abdominal pain, nausea, vomiting, urinary symptoms, headache. The patient does endorse history of kidney injury due to dehydration.  HPI     Home Medications Prior to Admission medications   Medication Sig Start Date End Date Taking? Authorizing Provider  cephALEXin (KEFLEX) 500 MG capsule Take 500 mg by mouth 2 (two) times daily. 11/13/22 11/20/22 Yes [provider]  amoxicillin-clavulanate (AUGMENTIN) 875-125 MG tablet Take 1 tablet by mouth 2 (two) times daily. One po bid x 7 days 05/06/18   Street, [place], PA-C  ARIPiprazole (ABILIFY) 5 MG tablet Take 5 mg by mouth daily.    [provider]  busPIRone (BUSPAR) 15 MG tablet Take 30 mg by mouth 3 (three) times daily.     [provider]  clonazePAM (KLONOPIN) 0.5 MG tablet Take 0.5 mg by mouth 4 (four) times daily as needed for anxiety.    [provider]  dicyclomine (BENTYL) 20 MG tablet Take 1 tablet (20 mg total) by mouth 3 (three) times daily with meals as needed for spasms (abdominal spasms/cramping and/or diarrhea). 05/06/18   Street, [place], PA-C  methocarbamol (ROBAXIN) 500 MG tablet Take 1,000 mg by mouth 2 (two) times daily.     [provider]  venlafaxine XR (EFFEXOR-XR) 150 MG 24 hr capsule Take 150 mg by mouth daily with breakfast.    [provider]      Allergies    Lisinopril, Norvasc [amlodipine], Reglan [metoclopramide], and Statins    Review of Systems   Review of Systems  Physical Exam Updated Vital Signs BP 108/71   Pulse 87   Temp 97.9 F (36.6 C) (Oral)   Resp 16   Ht 5\' 4"  (1.626 m)   Wt 77.1 kg   SpO2 97%   BMI 29.18 kg/m  Physical Exam Vitals and nursing note reviewed.  Constitutional:      General: She is not in acute distress.    Appearance: She is well-developed.  HENT:     Head: Normocephalic and atraumatic.  Eyes:     Extraocular Movements: Extraocular movements intact.     Conjunctiva/sclera: Conjunctivae normal.     Pupils: Pupils are equal, round, and reactive to light.  Cardiovascular:     Rate and Rhythm: Normal rate and regular rhythm.     Heart sounds: No murmur heard. Pulmonary:     Effort: Pulmonary effort is normal. No respiratory distress.     Breath sounds: Normal breath sounds.  Abdominal:     Palpations: Abdomen is soft.     Tenderness: There is no abdominal tenderness.  Musculoskeletal:        General: No swelling.     Cervical back: Neck supple.  Skin:    General: Skin is  warm and dry.     Capillary Refill: Capillary refill takes less than 2 seconds.  Neurological:     Mental Status: She is alert and oriented to person, place, and time.     Cranial Nerves: No cranial nerve deficit.     Sensory: No sensory deficit.     Motor: No weakness.  Psychiatric:        Mood and Affect: Mood normal.     ED Results / Procedures / Treatments   Labs (all labs ordered are listed, but only abnormal results are displayed) Labs Reviewed  BASIC METABOLIC PANEL - Abnormal; Notable for the following components:      Result Value   CO2 21 (*)    Glucose, Bld 128 (*)    BUN 25 (*)    Creatinine, Ser 1.67 (*)    GFR, Estimated 37 (*)    All  other components within normal limits  URINALYSIS, ROUTINE W REFLEX MICROSCOPIC - Abnormal; Notable for the following components:   APPearance CLOUDY (*)    Protein, ur 30 (*)    Leukocytes,Ua LARGE (*)    All other components within normal limits  CBG MONITORING, ED - Abnormal; Notable for the following components:   Glucose-Capillary 130 (*)    All other components within normal limits  CBC  MAGNESIUM  BASIC METABOLIC PANEL    EKG EKG Interpretation Date/Time:  Wednesday November 18 2022 20:35:45 EDT Ventricular Rate:  99 PR Interval:  141 QRS Duration:  104 QT Interval:  355 QTC Calculation: 456 R Axis:   70  Text Interpretation: Sinus rhythm Confirmed by Tilden Fossa [phone]) on 11/18/2022 11:12:10 PM  Radiology No results found.  Procedures Procedures    Medications Ordered in ED Medications  lactated ringers infusion (has no administration in time range)  lactated ringers bolus 500 mL (has no administration in time range)  sodium chloride 0.9 % bolus 1,000 mL (0 mLs Intravenous Stopped 11/19/22 0251)    ED Course/ Medical Decision Making/ A&P                                 Medical Decision Making Amount and/or Complexity of Data Reviewed Labs: ordered.   This patient presents to the ED for concern of lightheadedness and palpitations, this involves an extensive number of treatment options, and is a complaint that carries with it a high risk of complications and morbidity.  The differential diagnosis includes dysrhythmia, dehydration, intracranial normality, others   Co morbidities that complicate the patient evaluation  History of hypertension, syncope, anxiety, kidney injury   Additional history obtained:   External records from outside source obtained and reviewed including lab results from the summer showing apparent creatinine level baseline of around 0.8   Lab Tests:  I Ordered, and personally interpreted labs.  The pertinent results include:  Creatinine 1.67 with a EGFR 37   Cardiac Monitoring: / EKG:  The patient was maintained on a cardiac monitor.  I personally viewed and interpreted the cardiac monitored which showed an underlying rhythm of: Sinus rhythm   Consultations Obtained:  I requested consultation with the hospitalist, Dr.Crosley, and discussed lab and imaging findings as well as pertinent plan - they recommend: admission   Problem List / ED Course / Critical interventions / Medication management   I ordered medication including normal saline bolus for dehydration/lightheadedness Reevaluation of the patient after these medicines showed that the patient improved I  have reviewed the patients home medicines and have made adjustments as needed   Social Determinants of Health:  Patient is Medicaid for primary health insurance type   Test / Admission - Considered:  Patient feels better symptomatically after fluid administration but does have an acute AKI. Discussed outpatient follow up after rehydration at home but patient felt uncomfortable with discharge due to history of worsened kidney injury in the past requiring hospitalization. Requested consultation with the hospitalist for admission for continued IV hydration and serial labs.          Final Clinical Impression(s) / ED Diagnoses Final diagnoses:  AKI (acute kidney injury) [facility])    Rx / DC Orders ED Discharge Orders     None         Pamala Duffel 11/19/22 0259    Tilden Fossa, MD 11/19/22 2251

## 2022-11-19 NOTE — Discharge Instructions (Signed)
[facility]) Monday - Friday 8am - 3pm          Sat & Sun 8am - 2pm [address] [place], [place] [address]   [phone]     www.[email or web address] IRC offers among other critical resources: showers, laundry, barbershop, phone bank, mailroom, computer lab, medical clinic, gardens and a bike maintenance area.   AREA SHELTERS  [facility]  (Men & women) [address] [place] [phone]  [facility] (Men/women/families) [address] [place] [phone]   [facility] (Families with children) [address]  [place] [phone]   [facility] (Domestic Violence Shelter) [address]  [place] [phone]   [facility] (Children ages 7-17) [address]  [place] [phone]   [facility]   (Women & children) [address] [place] [phone]   [facility] (Women/substance abuse) [address]  [place] [phone]   [facility] (Men) [address]  [place] [phone]   [facility] (Men) [address]  [place] [phone]  [facility] (Women) [address]  [place] [phone]   [facility] (Single women & women with children) [address]  [place] [phone]  [facility] (Men/women/families) [address]  [place] [phone]    [facility]   (Domestic Violence shelter) [address]  [place] [phone]   [facility] (Men & women) [address]  [place] [phone]  [facility] (Men) [address]  [place] [phone]   [place] (Men) [address]  [place] [phone]   [facility] (Single women & families) [address]  [place] [phone]  [facility] (Men/women & families) [address]   [place] [phone]    If you are at risk of losing your housing (throughout [place]) call the Housing Hotline at (336)691-9521. You may also contact 2-1-1, a FREE service of the [employer] that provides information about many resources including housing. Dial 211, or visit online at www.nc211.org. [place]:  [facility], contact Steve Adams [phone] (men only)                          [facility] in [place]:  90 day homeless program for women and men;                             contact Rev. Chambers [phone]  [place]:  [facility]:  men/women/children [phone]  [place]:  Shelter in [place], Pastor Kivett [phone]                       [facility] in [place], Santiago Lopez [phone]   [place]:  [facility], 910-572-3749; (women and children)  [place]:  [facility], men/women/children; [phone]                           [facility] in [place], [phone]; substance abuse halfway house for men              [facility]; 4 bedroom house in [place] for homeless women, contact Elaine Owens [phone]               [facility]   in [place], Susan Bellow, [phone] (women and children)               [facility], for abused women and children, 24 hour crisis line, 910-947-3333, Anne Friesen  [facility], halfway house for women, [place], [phone]  [place]:  [facility], [phone]; open Mon-Thurs from Jan14 - March 15 when temp is below 32 degrees                              [facility]; Pastor Jeff Looney, [phone]; cell [phone]; open 24/7  [place]:  [facility] - Rev Taylor - [phone]  [place]:  transitional housing for women and children; Sabrina Hough [phone]  

## 2022-11-19 NOTE — Plan of Care (Signed)

## 2022-11-19 NOTE — Care Management (Signed)
Transition of Care [facility]) - Inpatient Brief Assessment   Patient Details  Name: Alisha Webster MRN: [id number] Date of Birth: 1970-06-18  Transition of [facility]) CM/SW Contact:    Lockie Pares, RN Phone Number: 11/19/2022, 6:40 PM   Clinical Narrative: Patient with SDOH needs of food insecurity, housing and transportation needs, presents with dizziness and AKI. She has been homeless for a few days. Presented to the ED a few days ago as well and found to have hypokalemia. She has been taking potassium supplementation.  Housing resources placed on chart. Will follow up with patient about resources.   Transition of Care Asessment: Insurance and Status: Insurance coverage has been reviewed Patient has primary care physician: Yes Home environment has been reviewed: homeless Prior level of function:: Independent Prior/Current Home Services: No current home services Social Determinants of Health Reivew: SDOH reviewed needs interventions Readmission risk has been reviewed: Yes Transition of care needs: transition of care needs identified, TOC will continue to follow

## 2022-11-19 NOTE — Progress Notes (Signed)
OT Cancellation Note  Patient Details Name: Alisha Webster MRN: [id number] DOB: Jan 15, 1971   Cancelled Treatment:    Reason Eval/Treat Not Completed: Other (comment);Fatigue/lethargy limiting ability to participate (Per RN, pt recently given medication that makes her groggy with pt currently having difficulty maintaining alertness. RN recommends reattempting OT eval at a late time. OT to reattempt to see pt for skilled OT eval tomorrow as appropriate/available.)  Rosanne Sack "Orson Eva., OTR/L, MA Acute Rehab [phone]   Lendon Colonel 11/19/2022, 3:43 PM

## 2022-11-19 NOTE — H&P (Signed)
PCP:   Maye Hides, PA   Chief Complaint:  Dizziness  HPI: This is a 52 year old female with past medical history of hypothyroidism, HTN, seizures, anxiety and depression, chronic pain.  Yesterday patient states she was dizzy all day.  This is the point of almost passing out.  She states she was also very very weak.  In the ER she developed mild diarrhea and nausea.  She denies abdominal pain or any significant cough.  Per patient she has been homeless the past 2 days, stepped on the streets.  She states she is eating and drinking okay.  She states she is taking all of her medications except her Synthroid which she is out of.  She came to the ER.  In the ER presenting vitals are 152/106, 99, 18, afebrile.  Creatinine 1.67, baseline 0.93 in 10/2018 24.  Review of Systems:  Per HPI  Past Medical History: Past Medical History:  Diagnosis Date   Anxiety    Back pain    Depression    Hyperlipidemia    Hypertension    Past Surgical History:  Procedure Laterality Date   CHOLECYSTECTOMY     for gallstones   Tonisillectomy     TOTAL ABDOMINAL HYSTERECTOMY     One ovary left    Medications: Prior to Admission medications   Medication Sig Start Date End Date Taking? Authorizing Provider  ARIPiprazole (ABILIFY) 5 MG tablet Take 5 mg by mouth daily.   Yes [provider]  Ascorbic Acid (VITAMIN C PO) Take 1 tablet by mouth daily.   Yes [provider]  busPIRone (BUSPAR) 10 MG tablet Take 10 mg by mouth 2 (two) times daily.   Yes [provider]  cephALEXin (KEFLEX) 500 MG capsule Take 500 mg by mouth 2 (two) times daily. 11/13/22 11/20/22 Yes [provider]  CYANOCOBALAMIN PO Take 1 tablet by mouth daily.   Yes [provider]  gabapentin (NEURONTIN) 100 MG capsule Take 200 mg by mouth 3 (three) times daily.   Yes [provider]  hydrochlorothiazide (HYDRODIURIL) 25 MG tablet Take 25 mg by mouth daily.   Yes [provider]  levETIRAcetam (KEPPRA) 1000 MG tablet Take 1,000 mg by mouth 2 (two) times daily.   Yes [provider]  levothyroxine (SYNTHROID) 88 MCG tablet Take 88 mcg by mouth in the morning.   Yes [provider]  oxycodone-acetaminophen (LYNOX) 10-300 MG tablet Take 1 tablet by mouth every 6 (six) hours as needed for pain.   Yes [provider]  potassium chloride SA (KLOR-CON M) 20 MEQ tablet Take 20 mEq by mouth 2 (two) times daily.   Yes [provider]  venlafaxine XR (EFFEXOR-XR) 75 MG 24 hr capsule Take 150 mg by mouth in the morning.   Yes [provider]  VITAMIN D PO Take 1 tablet by mouth daily.   Yes [provider]  methocarbamol (ROBAXIN) 500 MG tablet Take 1,000 mg by mouth 2 (two) times daily. Patient not taking: Reported on 11/19/2022    [provider]    Allergies:   Allergies  Allergen Reactions   Lisinopril Other (See Comments)    Makes kidneys shut down   Norvasc [Amlodipine] Other (See Comments)    Leg swelling   Reglan [Metoclopramide] Other (See Comments)    Felt hot, neck flushed   Statins Other (See Comments)    Severe muscle pain    Social History:  reports that she has never smoked.  She has never used smokeless tobacco. She reports that she does not drink alcohol and does not use drugs.  Family History: History reviewed. No pertinent family history.  Physical Exam: Vitals:   11/18/22 2039 11/18/22 2345 11/19/22 0038 11/19/22 0247  BP:  126/73 (!) 119/90 108/71  Pulse:  95 84 87  Resp:  17  16  Temp:   97.9 F (36.6 C)   TempSrc:   Oral   SpO2:  98% 97% 97%  Weight: 77.1 kg     Height: 5\' 4"  (1.626 m)       General: A & O x 3, well developed and nourished, no acute distress Eyes: PERRLA, pink conjunctiva, no scleral icterus ENT: Moist oral mucosa, neck supple, no thyromegaly Lungs: CTA B/L, no wheeze, no crackles, no use of accessory muscles Cardiovascular: RRR, no regurgitation, no  gallops, no murmurs. No carotid bruits, no JVD Abdomen: soft, positive BS, NTND, no organomegaly, not an acute abdomen GU: not examined Neuro: CN II - XII grossly intact, sensation intact Musculoskeletal: strength 5/5 all extremities, no clubbing, cyanosis or edema Skin: no rash, no subcutaneous crepitation, no decubitus Psych: appropriate patient   Labs on Admission:  Recent Labs    11/18/22 0016 11/18/22 2101  NA  --  139  K  --  4.1  CL  --  107  CO2  --  21*  GLUCOSE  --  128*  BUN  --  25*  CREATININE  --  1.67*  CALCIUM  --  9.2  MG 1.9  --     Recent Labs    11/18/22 2101  WBC 10.0  HGB 14.7  HCT 45.4  MCV 91.3  PLT 332     Radiological Exams on Admission: No results found.  Assessment/Plan Present on Admission:  AKI -Brief overnight observation. -IV fluid hydration -BMP in a.m. -HCTZ on hold. -Patient without neurological deficits.  Doubt CVA with patient's elevated creatinine and now nausea and diarrhea.   Hypertension -on hydrochlorothiazide, held -As needed blood pressure medications ordered   Hypothyroidism -on synthroid, resumed   Sjogren's disease -On no medications   Anxiety and depression -buspar, abilfy and effexor resumed  Seizure -on keprra.  Per patient she takes once daily  Chronic pain -Cervical spine surgery 4wks ago -PT/OT consult placed. -Oxycodone resumed  Recently homeless -Social service consult placed  Alisha Webster 11/19/2022, 4:50 AM

## 2022-11-20 DIAGNOSIS — N179 Acute kidney failure, unspecified: Secondary | ICD-10-CM | POA: Diagnosis not present

## 2022-11-20 LAB — GASTROINTESTINAL PANEL BY PCR, STOOL (REPLACES STOOL CULTURE)

## 2022-11-20 LAB — CBC WITH DIFFERENTIAL/PLATELET
Abs Immature Granulocytes: 0.02 10*3/uL (ref 0.00–0.07)
Basophils Absolute: 0 10*3/uL (ref 0.0–0.1)
Basophils Relative: 0 %
Eosinophils Absolute: 0.1 10*3/uL (ref 0.0–0.5)
Eosinophils Relative: 2 %
HCT: 38.7 % (ref 36.0–46.0)
Hemoglobin: 12.8 g/dL (ref 12.0–15.0)
Immature Granulocytes: 0 %
Lymphocytes Relative: 37 %
Lymphs Abs: 2.2 10*3/uL (ref 0.7–4.0)
MCH: 30.3 pg (ref 26.0–34.0)
MCHC: 33.1 g/dL (ref 30.0–36.0)
MCV: 91.5 fL (ref 80.0–100.0)
Monocytes Absolute: 0.3 10*3/uL (ref 0.1–1.0)
Monocytes Relative: 5 %
Neutro Abs: 3.2 10*3/uL (ref 1.7–7.7)
Neutrophils Relative %: 56 %
Platelets: 240 10*3/uL (ref 150–400)
RBC: 4.23 MIL/uL (ref 3.87–5.11)
RDW: 12.2 % (ref 11.5–15.5)
WBC: 5.9 10*3/uL (ref 4.0–10.5)
nRBC: 0 % (ref 0.0–0.2)

## 2022-11-20 LAB — BASIC METABOLIC PANEL
Anion gap: 7 (ref 5–15)
BUN: 12 mg/dL (ref 6–20)
CO2: 23 mmol/L (ref 22–32)
Calcium: 8.5 mg/dL — ABNORMAL LOW (ref 8.9–10.3)
Chloride: 109 mmol/L (ref 98–111)
Creatinine, Ser: 0.87 mg/dL (ref 0.44–1.00)
GFR, Estimated: >60 mL/min (ref >60)
Glucose, Bld: 94 mg/dL (ref 70–99)
Potassium: 3.9 mmol/L (ref 3.5–5.1)
Sodium: 139 mmol/L (ref 135–145)

## 2022-11-20 NOTE — Progress Notes (Signed)
OT Cancellation Note  Patient Details Name: Alisha Webster MRN: [id number] DOB: [birth year]   Cancelled Treatment:    Reason Eval/Treat Not Completed: Patient declined, no reason specified (Pt currently refusing skilled rehab services. Per conversation with RN and secure chat with PT, pt with no equipment needs and no further benefit from acute skilled OT services at this time. OT is signing off.) If pt would like to receive skilled rehab services in the future, please re-consult.   Nasif Bos "Orson Eva., OTR/L, MA Acute Rehab [phone]   Lendon Colonel 11/20/2022, 4:40 PM

## 2022-11-20 NOTE — Progress Notes (Signed)
PT Cancellation Note  Patient Details Name: Alisha Webster MRN: [id number] DOB: April 20, 1970   Cancelled Treatment:    Reason Eval/Treat Not Completed: Patient declined, no reason specified (Pt states she has no needs and does not want physical therapy. Pt will be discharged at this time. If pt would like to receive skilled therapy services in the future please re-consult.)  Harrel Carina, DPT, CLT  Acute Rehabilitation Services Office: [phone] (Secure chat preferred)   Claudia Desanctis 11/20/2022, 10:04 AM

## 2022-11-20 NOTE — Discharge Summary (Signed)
Physician Discharge Summary  Alisha Webster YTK:[id number] DOB: September 06, 1970 DOA: 11/18/2022  PCP: Maye Hides, PA  Admit date: 11/18/2022 Discharge date: 11/20/2022  Admitted From: Home Disposition:  Home  Discharge Condition:Stable CODE STATUS:FULL Diet recommendation: Heart Healthy  Brief/Interim Summary: Patient is an 52 y.o. female  homeless female past medical history of hypothyroidism, essential hypertension, seizures, homelessness for 2 days relates she started having dizziness a day prior to admission with near syncopal episodes.  Patient also reported dizziness, was very weak, was having mild diarrhea and nausea.  Denies any abdominal pain.  No vomiting.  Patient seems mildly hypertensive, creatinine was elevated.  Patient was admitted for the management of AKI, started on IV fluids.  This morning her kidney function has completed normalized.  She has had 1 episode of diarrhea this morning.  Denies any vomiting or abdominal pain.  Abdomen is soft and nondistended.  She is stable for discharge .  TOC was consulted for concern of homelessness.  Following problems were addressed during the hospitalization:  AKI: Resolved with IV fluids .  Can resume hydrochlorothiazide on discharge  Orthostatic hypotension: Last set of orthostatic vitals were negative.  Denies any dizziness or lightheadedness this morning  Diarrhea/abdomen pain: Had 1 episode of loose stool today.  Abdomen is soft and nondistended.  Denies abdominal pain  today.  GI pathogen panel sent not resulted yet.  No leukocytosis.  Hypertension: Continue home regimen  Hypertension: Continue statin  Anxiety/depression: Continue home regimen  Hypothyroidism: Continue Synthyroid  Seizure disorder: Continue Keppra   Discharge Diagnoses:  Principal Problem:   AKI (acute kidney injury) (HCC) Active Problems:   Hypertension   Hyperlipidemia   Anxiety and depression   Hypothyroidism   Sjogren's disease  (HCC)    Discharge Instructions  Discharge Instructions     Diet general   Complete by: As directed    Discharge instructions   Complete by: As directed    1)Please follow up with your PCP in a week   Increase activity slowly   Complete by: As directed       Allergies as of 11/20/2022       Reactions   Lisinopril Other (See Comments)   Makes kidneys shut down   Norvasc [amlodipine] Other (See Comments)   Leg swelling   Reglan [metoclopramide] Other (See Comments)   Felt hot, neck flushed   Statins Other (See Comments)   Severe muscle pain        Medication List     TAKE these medications    ARIPiprazole 5 MG tablet Commonly known as: ABILIFY Take 5 mg by mouth daily.   busPIRone 10 MG tablet Commonly known as: BUSPAR Take 10 mg by mouth 2 (two) times daily.   gabapentin 100 MG capsule Commonly known as: NEURONTIN Take 200 mg by mouth 3 (three) times daily.   hydrochlorothiazide 25 MG tablet Commonly known as: HYDRODIURIL Take 25 mg by mouth daily.   levETIRAcetam 1000 MG tablet Commonly known as: KEPPRA Take 1,000 mg by mouth 2 (two) times daily.   levothyroxine 88 MCG tablet Commonly known as: SYNTHROID Take 88 mcg by mouth in the morning.   oxycodone-acetaminophen 10-300 MG tablet Commonly known as: LYNOX Take 1 tablet by mouth every 6 (six) hours as needed for pain.   potassium chloride SA 20 MEQ tablet Commonly known as: KLOR-CON M Take 20 mEq by mouth 2 (two) times daily.   venlafaxine XR 75 MG 24 hr capsule Commonly known as: EFFEXOR-XR Take  150 mg by mouth in the morning.   VITAMIN D PO Take 1 tablet by mouth daily.        Follow-up Information     Maye Hides, [place]. Schedule an appointment as soon as possible for a visit in 1 week(s).   Specialty: Physician Assistant Contact information: [phone] [address] [place] [address] [phone]                Allergies  Allergen Reactions   Lisinopril Other (See  Comments)    Makes kidneys shut down   Norvasc [Amlodipine] Other (See Comments)    Leg swelling   Reglan [Metoclopramide] Other (See Comments)    Felt hot, neck flushed   Statins Other (See Comments)    Severe muscle pain    Consultations: None   Procedures/Studies: No results found.    Subjective: Patient seen and examined at bedside today.  Hemodynamically stable for discharge.  Discharge Exam: Vitals:   11/20/22 0354 11/20/22 0811  BP: 109/67 136/84  Pulse: 65 69  Resp: 16 16  Temp: 97.7 F (36.5 C) 98.1 F (36.7 C)  SpO2: 100% 99%   Vitals:   11/19/22 1500 11/19/22 1945 11/20/22 0354 11/20/22 0811  BP: 111/74 129/82 109/67 136/84  Pulse: 64 67 65 69  Resp: 18 18 16 16   Temp: 97.8 F (36.6 C) 98 F (36.7 C) 97.7 F (36.5 C) 98.1 F (36.7 C)  TempSrc: Oral Oral Oral Oral  SpO2:  97% 100% 99%  Weight:      Height:        General: Pt is alert, awake, not in acute distress Cardiovascular: RRR, S1/S2 +, no rubs, no gallops Respiratory: CTA bilaterally, no wheezing, no rhonchi Abdominal: Soft, NT, ND, bowel sounds + Extremities: no edema, no cyanosis    The results of significant diagnostics from this hospitalization (including imaging, microbiology, ancillary and laboratory) are listed below for reference.     Microbiology: No results found for this or any previous visit (from the past 240 hour(s)).   Labs: BNP (last 3 results) No results for input(s): "BNP" in the last 8760 hours. Basic Metabolic Panel: Recent Labs  Lab 11/18/22 0016 11/18/22 2101 11/20/22 0430  NA  --  139 139  K  --  4.1 3.9  CL  --  107 109  CO2  --  21* 23  GLUCOSE  --  128* 94  BUN  --  25* 12  CREATININE  --  1.67* 0.87  CALCIUM  --  9.2 8.5*  MG 1.9  --   --    Liver Function Tests: No results for input(s): "AST", "ALT", "ALKPHOS", "BILITOT", "PROT", "ALBUMIN" in the last 168 hours. No results for input(s): "LIPASE", "AMYLASE" in the last 168 hours. No  results for input(s): "AMMONIA" in the last 168 hours. CBC: Recent Labs  Lab 11/18/22 2101 11/20/22 0430  WBC 10.0 5.9  NEUTROABS  --  3.2  HGB 14.7 12.8  HCT 45.4 38.7  MCV 91.3 91.5  PLT 332 240   Cardiac Enzymes: No results for input(s): "CKTOTAL", "CKMB", "CKMBINDEX", "TROPONINI" in the last 168 hours. BNP: Invalid input(s): "POCBNP" CBG: Recent Labs  Lab 11/18/22 2256  GLUCAP 130*   D-Dimer No results for input(s): "DDIMER" in the last 72 hours. Hgb A1c No results for input(s): "HGBA1C" in the last 72 hours. Lipid Profile No results for input(s): "CHOL", "HDL", "LDLCALC", "TRIG", "CHOLHDL", "LDLDIRECT" in the last 72 hours. Thyroid function studies No results  for input(s): "TSH", "T4TOTAL", "T3FREE", "THYROIDAB" in the last 72 hours.  Invalid input(s): "FREET3" Anemia work up No results for input(s): "VITAMINB12", "FOLATE", "FERRITIN", "TIBC", "IRON", "RETICCTPCT" in the last 72 hours. Urinalysis    Component Value Date/Time   COLORURINE YELLOW 11/18/2022 2055   APPEARANCEUR CLOUDY (A) 11/18/2022 2055   LABSPEC 1.020 11/18/2022 2055   PHURINE 5.0 11/18/2022 2055   GLUCOSEU NEGATIVE 11/18/2022 2055   HGBUR NEGATIVE 11/18/2022 2055   BILIRUBINUR NEGATIVE 11/18/2022 2055   KETONESUR NEGATIVE 11/18/2022 2055   PROTEINUR 30 (A) 11/18/2022 2055   UROBILINOGEN 0.2 02/01/2014 1701   NITRITE NEGATIVE 11/18/2022 2055   LEUKOCYTESUR LARGE (A) 11/18/2022 2055   Sepsis Labs Recent Labs  Lab 11/18/22 2101 11/20/22 0430  WBC 10.0 5.9   Microbiology No results found for this or any previous visit (from the past 240 hour(s)).  Please note: You were cared for by a hospitalist during your hospital stay. Once you are discharged, your primary care physician will handle any further medical issues. Please note that NO REFILLS for any discharge medications will be authorized once you are discharged, as it is imperative that you return to your primary care physician (or  establish a relationship with a primary care physician if you do not have one) for your post hospital discharge needs so that they can reassess your need for medications and monitor your lab values.    Time coordinating discharge: 40 minutes  SIGNED:   Burnadette Pop, MD  [facility] 11/20/2022, 1:21 PM Pager [phone]  If 7PM-7AM, please contact night-coverage [email or web address] Password [email or web address]

## 2022-11-20 NOTE — Plan of Care (Signed)

## 2022-11-20 NOTE — TOC Transition Note (Signed)
Transition of Care [facility]) - CM/SW Discharge Note   Patient Details  Name: Alisha Webster MRN: [id number] Date of Birth: Sep 28, 1970  [facility] CM/SW Contact:  Lockie Pares, RN Phone Number: 11/20/2022, 9:01 AM   Clinical Narrative:    Patient will likely DC today. Homeless resources provided. Called patient to discuss discharge planning and resources. Left a confidential message to call this RNCM back   Final next level of care: Home/Self Care Barriers to Discharge: No Barriers Identified   Patient Goals and CMS Choice      Discharge Placement                         Discharge Plan and Services Additional resources added to the After Visit Summary for                                       Social Determinants of Health (SDOH) Interventions SDOH Screenings   Food Insecurity: Food Insecurity Present (11/19/2022)  Housing: High Risk (11/19/2022)  Transportation Needs: Unmet Transportation Needs (11/19/2022)  Utilities: At Risk (11/19/2022)  Financial Resource Strain: High Risk (07/11/2018)   Received from [facility] visits prior to 05/30/2022.  Social Connections: Unknown (08/05/2021)   Received from [facility]  Tobacco Use: Low Risk  (11/18/2022)     Readmission Risk Interventions     No data to display

## 2022-11-20 NOTE — Progress Notes (Addendum)
Patient discharged.  CNA removed PIV.  Reviewed discharge instructions, medications and follow up appts with patient.  Answered questions.  Gave patient copy of discharge instructions.  There were no new prescriptions.    No additional questions or concerns at this time.    Patient stated her ride will be coming at 1700.   1804: Entered patient's room to check on ride.  Patient was gone. Patient did not inform us she was leaving.

## 2022-11-22 ENCOUNTER — Encounter (HOSPITAL_COMMUNITY): Payer: Self-pay

## 2022-11-22 ENCOUNTER — Emergency Department (HOSPITAL_COMMUNITY): Payer: Medicaid Other

## 2022-11-22 ENCOUNTER — Emergency Department (EMERGENCY_DEPARTMENT_HOSPITAL)
Admission: EM | Admit: 2022-11-22 | Discharge: 2022-11-23 | Disposition: A | Discharge status: Home / Self Care | Payer: Medicaid Other | Source: Home / Self Care | Attending: Emergency Medicine | Admitting: Emergency Medicine

## 2022-11-22 DIAGNOSIS — E039 Hypothyroidism, unspecified: Secondary | ICD-10-CM | POA: Insufficient documentation

## 2022-11-22 DIAGNOSIS — Z79899 Other long term (current) drug therapy: Secondary | ICD-10-CM | POA: Insufficient documentation

## 2022-11-22 DIAGNOSIS — Z765 Malingerer [conscious simulation]: Secondary | ICD-10-CM

## 2022-11-22 DIAGNOSIS — R29810 Facial weakness: Secondary | ICD-10-CM | POA: Insufficient documentation

## 2022-11-22 DIAGNOSIS — R791 Abnormal coagulation profile: Secondary | ICD-10-CM | POA: Insufficient documentation

## 2022-11-22 DIAGNOSIS — F329 Major depressive disorder, single episode, unspecified: Secondary | ICD-10-CM | POA: Insufficient documentation

## 2022-11-22 DIAGNOSIS — R45851 Suicidal ideations: Secondary | ICD-10-CM | POA: Insufficient documentation

## 2022-11-22 DIAGNOSIS — R4781 Slurred speech: Secondary | ICD-10-CM | POA: Insufficient documentation

## 2022-11-22 DIAGNOSIS — I10 Essential (primary) hypertension: Secondary | ICD-10-CM | POA: Insufficient documentation

## 2022-11-22 DIAGNOSIS — R531 Weakness: Secondary | ICD-10-CM | POA: Insufficient documentation

## 2022-11-22 DIAGNOSIS — R200 Anesthesia of skin: Secondary | ICD-10-CM

## 2022-11-22 DIAGNOSIS — Z59819 Housing instability, housed unspecified: Secondary | ICD-10-CM

## 2022-11-22 DIAGNOSIS — E876 Hypokalemia: Secondary | ICD-10-CM

## 2022-11-22 LAB — DIFFERENTIAL
Abs Immature Granulocytes: 0.01 10*3/uL (ref 0.00–0.07)
Basophils Absolute: 0 10*3/uL (ref 0.0–0.1)
Basophils Relative: 0 %
Eosinophils Absolute: 0.2 10*3/uL (ref 0.0–0.5)
Eosinophils Relative: 3 %
Immature Granulocytes: 0 %
Lymphocytes Relative: 35 %
Lymphs Abs: 2.3 10*3/uL (ref 0.7–4.0)
Monocytes Absolute: 0.4 10*3/uL (ref 0.1–1.0)
Monocytes Relative: 6 %
Neutro Abs: 3.7 10*3/uL (ref 1.7–7.7)
Neutrophils Relative %: 56 %

## 2022-11-22 LAB — CBC
HCT: 37.6 % (ref 36.0–46.0)
Hemoglobin: 12.8 g/dL (ref 12.0–15.0)
MCH: 30.6 pg (ref 26.0–34.0)
MCHC: 34 g/dL (ref 30.0–36.0)
MCV: 90 fL (ref 80.0–100.0)
Platelets: 289 10*3/uL (ref 150–400)
RBC: 4.18 MIL/uL (ref 3.87–5.11)
RDW: 12 % (ref 11.5–15.5)
WBC: 6.7 10*3/uL (ref 4.0–10.5)
nRBC: 0 % (ref 0.0–0.2)

## 2022-11-22 LAB — PROTIME-INR
INR: 1 (ref 0.8–1.2)
Prothrombin Time: 13.5 seconds (ref 11.4–15.2)

## 2022-11-22 LAB — CBG MONITORING, ED: Glucose-Capillary: 205 mg/dL — ABNORMAL HIGH (ref 70–99)

## 2022-11-22 LAB — I-STAT CHEM 8, ED
BUN: 14 mg/dL (ref 6–20)
Calcium, Ion: 1.18 mmol/L (ref 1.15–1.40)
Chloride: 103 mmol/L (ref 98–111)
Creatinine, Ser: 1 mg/dL (ref 0.44–1.00)
Glucose, Bld: 167 mg/dL — ABNORMAL HIGH (ref 70–99)
HCT: 37 % (ref 36.0–46.0)
Hemoglobin: 12.6 g/dL (ref 12.0–15.0)
Potassium: 3.3 mmol/L — ABNORMAL LOW (ref 3.5–5.1)
Sodium: 140 mmol/L (ref 135–145)
TCO2: 22 mmol/L (ref 22–32)

## 2022-11-22 LAB — ETHANOL: Alcohol, Ethyl (B): <10 mg/dL (ref <10)

## 2022-11-22 LAB — COMPREHENSIVE METABOLIC PANEL
ALT: 26 U/L (ref 0–44)
AST: 29 U/L (ref 15–41)
Albumin: 3.3 g/dL — ABNORMAL LOW (ref 3.5–5.0)
Alkaline Phosphatase: 91 U/L (ref 38–126)
Anion gap: 13 (ref 5–15)
BUN: 12 mg/dL (ref 6–20)
CO2: 24 mmol/L (ref 22–32)
Calcium: 8.8 mg/dL — ABNORMAL LOW (ref 8.9–10.3)
Chloride: 102 mmol/L (ref 98–111)
Creatinine, Ser: 1.09 mg/dL — ABNORMAL HIGH (ref 0.44–1.00)
GFR, Estimated: >60 mL/min (ref >60)
Glucose, Bld: 177 mg/dL — ABNORMAL HIGH (ref 70–99)
Potassium: 3.2 mmol/L — ABNORMAL LOW (ref 3.5–5.1)
Sodium: 139 mmol/L (ref 135–145)
Total Bilirubin: 0.7 mg/dL (ref 0.3–1.2)
Total Protein: 6 g/dL — ABNORMAL LOW (ref 6.5–8.1)

## 2022-11-22 LAB — APTT: aPTT: 23 seconds — ABNORMAL LOW (ref 24–36)

## 2022-11-22 MED ORDER — AMLODIPINE BESYLATE 5 MG PO TABS
10.0000 mg | ORAL_TABLET | Freq: Every day | ORAL | Status: DC
  Administered 2022-11-23: 10 mg via ORAL
  Filled 2022-11-22: qty 2

## 2022-11-22 MED ORDER — LEVOTHYROXINE SODIUM 88 MCG PO TABS
88.0000 ug | ORAL_TABLET | Freq: Every morning | ORAL | Status: DC
  Administered 2022-11-23: 88 ug via ORAL
  Filled 2022-11-22: qty 1

## 2022-11-22 MED ORDER — SODIUM CHLORIDE 0.9% FLUSH: 3.0000 mL | Freq: Once | INTRAVENOUS | Status: DC

## 2022-11-22 MED ORDER — PANTOPRAZOLE SODIUM 40 MG PO TBEC
40.0000 mg | DELAYED_RELEASE_TABLET | Freq: Every day | ORAL | Status: DC
  Administered 2022-11-23: 40 mg via ORAL
  Filled 2022-11-22: qty 1

## 2022-11-22 MED ORDER — IOHEXOL 350 MG/ML SOLN
75.0000 mL | Freq: Once | INTRAVENOUS | Status: AC | PRN
  Administered 2022-11-22: 75 mL via INTRAVENOUS

## 2022-11-22 MED ORDER — VENLAFAXINE HCL ER 75 MG PO CP24
150.0000 mg | ORAL_CAPSULE | Freq: Every morning | ORAL | Status: DC
  Administered 2022-11-23: 150 mg via ORAL
  Filled 2022-11-22: qty 2

## 2022-11-22 MED ORDER — ARIPIPRAZOLE 10 MG PO TABS
5.0000 mg | ORAL_TABLET | Freq: Every day | ORAL | Status: DC
  Administered 2022-11-23: 5 mg via ORAL
  Filled 2022-11-22: qty 1

## 2022-11-22 MED ORDER — LEVETIRACETAM 500 MG PO TABS
1000.0000 mg | ORAL_TABLET | Freq: Two times a day (BID) | ORAL | Status: DC
  Administered 2022-11-23: 1000 mg via ORAL
  Filled 2022-11-22: qty 2

## 2022-11-22 MED ORDER — HYDROCHLOROTHIAZIDE 25 MG PO TABS
25.0000 mg | ORAL_TABLET | Freq: Every day | ORAL | Status: DC
  Administered 2022-11-23: 25 mg via ORAL
  Filled 2022-11-22: qty 1

## 2022-11-22 NOTE — ED Provider Notes (Signed)
I saw and evaluated the patient, reviewed the resident's note and I agree with the findings and plan.  52 year old female who states she woke up with left-sided facial droop with some slurred speech.  On exam here, her speech is normal.  No facial droop.  Strength is 5 of 5.  Will do CT angio as well as MRI and likely discharge       Lorre Nick, MD 11/22/22 1955

## 2022-11-22 NOTE — Discharge Instructions (Addendum)
Follow-up with your primary care physician as soon as possible.  Monitor for any signs of worsening including increased weakness, changes in speech, lightheadedness, chest pain, or shortness of breath.  Return to the ED for any worsening symptoms or further concerns.

## 2022-11-22 NOTE — Code Documentation (Signed)
Responded to Code Stroke called at 1901 on pt already in the ED. Pt arrived to the ED at 1837 for L sided facial droop, slurred speech, L arm/leg weakness, and L sided sensory deficit, LSN-0000, NIH-4, CT head negative for acute changes, CTA-no LVO. TNK not given-low suspicion for stroke. Plan: VS/neuro checks q2h x 12, then q4h and MRI.

## 2022-11-22 NOTE — ED Notes (Signed)
Pt transferred to ED 17 and received for care at this time.

## 2022-11-22 NOTE — ED Provider Notes (Signed)
EMERGENCY DEPARTMENT AT [facility] Provider Note   CSN: [id number] Arrival date & time: 11/22/22  4010  An emergency department physician performed an initial assessment on this suspected stroke patient at 1853.  History  Chief Complaint  Patient presents with   Code Stroke   Facial Droop    Alisha Webster is a 52 y.o. female.  HPI   Patient is a 52yF w/ past medical history of hypothyroidism, essential hypertension, seizures, homelessness, presenting today for concerns of weakness and slurred speech.  She states that she felt well yesterday when going to sleep around midnight.  At some point today she developed facial droop, slurred speech, and left-sided weakness and sensory deficit.  She reports a headache when she woke up today.  Her friend noticed the slurred speech around 1700 prompting her visit.  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  Upon chart review, it seems that she has had prior strokelike symptoms possibly related to illicit drug use.  She currently denies any chest pain, shortness of breath, lightheadedness or confusion.  She is not on any blood thinners.  Home Medications Prior to Admission medications   Medication Sig Start Date End Date Taking? Authorizing Provider  amLODipine (NORVASC) 10 MG tablet Take 10 mg by mouth daily.   Yes [provider]  ARIPiprazole (ABILIFY) 5 MG tablet Take 5 mg by mouth daily.   Yes [provider]  busPIRone (BUSPAR) 10 MG tablet Take 10 mg by mouth 2 (two) times daily.   Yes [provider]  gabapentin (NEURONTIN) 100 MG capsule Take 200 mg by mouth 3 (three) times daily.   Yes [provider]  hydrochlorothiazide (HYDRODIURIL) 25 MG tablet Take 25 mg by mouth daily.   Yes [provider]  levETIRAcetam (KEPPRA) 1000 MG tablet Take 1,000 mg by mouth 2 (two) times daily.   Yes [provider]  levothyroxine (SYNTHROID) 88 MCG tablet Take 88  mcg by mouth in the morning.   Yes [provider]  lisinopril (ZESTRIL) 10 MG tablet Take 10 mg by mouth daily. 09/02/22  Yes [provider]  omeprazole (PRILOSEC) 20 MG capsule Take 20 mg by mouth daily. 09/03/22 12/02/22 Yes [provider]  Oxycodone HCl 10 MG TABS Take 10 mg by mouth every 6 (six) hours as needed (pain). 09/07/22  Yes [provider]  potassium chloride SA (KLOR-CON M) 20 MEQ tablet Take 20 mEq by mouth 2 (two) times daily.   Yes [provider]  venlafaxine XR (EFFEXOR-XR) 75 MG 24 hr capsule Take 150 mg by mouth in the morning.   Yes [provider]  VITAMIN D PO Take 1 tablet by mouth daily.   Yes [provider]      Allergies    Lisinopril, Norvasc [amlodipine], Reglan [metoclopramide], and Statins    Review of Systems   Review of Systems Negative except as noted above in the HPI  Physical Exam Updated Vital Signs BP 101/72   Pulse 83   Temp 97.7 F (36.5 C) (Temporal)   Resp 19   SpO2 99%  Physical Exam Vitals and nursing note reviewed.  Constitutional:      General: She is not in acute distress.    Appearance: She is well-developed.  HENT:     Head: Normocephalic and atraumatic.  Eyes:     Conjunctiva/sclera: Conjunctivae normal.     Pupils: Pupils are equal, round, and reactive to light.  Cardiovascular:     Rate and Rhythm: Normal rate and regular rhythm.     Heart sounds: No murmur heard. Pulmonary:     Effort: Pulmonary effort is normal. No respiratory distress.     Breath sounds: Normal breath sounds.     Comments: Saturating well on room air Abdominal:     General: There is no distension.     Palpations: Abdomen is soft.     Tenderness: There is no abdominal tenderness. There is no guarding.  Musculoskeletal:        General: No swelling or tenderness.     Cervical back: Neck supple.  Skin:    General: Skin is warm and dry.     Capillary Refill: Capillary refill takes less than  2 seconds.  Neurological:     Mental Status: She is alert and oriented to person, place, and time.     Comments: No significant facial droop noted.  Strength 4/5 in the left upper extremity as compared to 5/5 in the right upper extremity strength 5/5 in bilateral lower extremities.  Decreased sensation in the left upper extremity and over the left face.  Ambulating without significant difficulty.  No pronator drift.  Normal finger-to-nose.  Psychiatric:        Mood and Affect: Mood normal.     ED Results / Procedures / Treatments   Labs (all labs ordered are listed, but only abnormal results are displayed) Labs Reviewed  APTT - Abnormal; Notable for the following components:      Result Value   aPTT 23 (*)    All other components within normal limits  COMPREHENSIVE METABOLIC PANEL - Abnormal; Notable for the following components:   Potassium 3.2 (*)    Glucose, Bld 177 (*)    Creatinine, Ser 1.09 (*)    Calcium 8.8 (*)    Total Protein 6.0 (*)    Albumin 3.3 (*)    All other components within normal limits  CBG MONITORING, ED - Abnormal; Notable for the following components:   Glucose-Capillary 205 (*)    All other components within normal limits  I-STAT CHEM 8, ED - Abnormal; Notable for the following components:   Potassium 3.3 (*)    Glucose, Bld 167 (*)    All other components within normal limits  PROTIME-INR  ETHANOL  CBC  DIFFERENTIAL  CBG MONITORING, ED    EKG None  Radiology MR BRAIN WO CONTRAST  Result Date: 11/22/2022 CLINICAL DATA:  Stroke suspected, weakness, slurred speech EXAM: MRI HEAD WITHOUT CONTRAST TECHNIQUE: Multiplanar, multiecho pulse sequences of the brain and surrounding structures were obtained without intravenous contrast. COMPARISON:  09/01/2022 MRI head common correlation is made with 11/22/2022 CT head FINDINGS: Brain: No restricted diffusion to suggest acute or subacute infarct. No acute hemorrhage, mass, mass effect, or midline shift. No  hydrocephalus or extra-axial collection. Normal pituitary and craniocervical junction. No hemosiderin deposition to suggest remote hemorrhage. Scattered T2 hyperintense signal in the periventricular white matter, likely the sequela of mild chronic small vessel ischemic disease. Vascular: Normal arterial flow voids. Skull and upper cervical spine: Normal marrow signal. Sinuses/Orbits: Clear paranasal sinuses. No acute finding in the orbits. Other: The mastoid air cells are well aerated. IMPRESSION: No acute intracranial process. No evidence of acute or subacute infarct. Electronically Signed   By: Wiliam Ke M.D.   On: 11/22/2022 21:58   CT ANGIO HEAD NECK W WO CM (CODE STROKE)  Result Date: 11/22/2022 CLINICAL DATA:  Code stroke. Neuro deficit,  acute, stroke suspected. Left facial droop, left-sided weakness and sensory deficit, and slurred speech. Headache. EXAM: CT ANGIOGRAPHY HEAD AND NECK WITH AND WITHOUT CONTRAST TECHNIQUE: Multidetector CT imaging of the head and neck was performed using the standard protocol during bolus administration of intravenous contrast. Multiplanar CT image reconstructions and MIPs were obtained to evaluate the vascular anatomy. Carotid stenosis measurements (when applicable) are obtained utilizing NASCET criteria, using the distal internal carotid diameter as the denominator. RADIATION DOSE REDUCTION: This exam was performed according to the departmental dose-optimization program which includes automated exposure control, adjustment of the mA and/or kV according to patient size and/or use of iterative reconstruction technique. CONTRAST:  75mL OMNIPAQUE IOHEXOL 350 MG/ML SOLN COMPARISON:  CTA head and neck 08/29/2022 FINDINGS: CTA NECK FINDINGS Aortic arch: Standard 3 vessel aortic arch with widely patent arch vessel origins. Right carotid system: Patent without evidence of stenosis or dissection. Left carotid system: Patent without evidence of stenosis or dissection. Vertebral  arteries: Patent without evidence of stenosis or dissection. Strongly dominant left vertebral artery. Skeleton: C3-C7 ACDF. Severe bilateral facet arthrosis with facet joint widening at C3-4. Severe right facet arthrosis at C2-3. Other neck: Postoperative changes in the left anterior neck, likely related to interval C3-4 ACDF. Right thyroidectomy. Upper chest: No mass or consolidation in the included lung apices. Review of the MIP images confirms the above findings CTA HEAD FINDINGS Anterior circulation: The internal carotid arteries are widely patent from skull base to carotid termini. ACAs and MCAs are patent without evidence of a proximal branch occlusion or significant proximal stenosis. No aneurysm is identified. Posterior circulation: The intracranial vertebral arteries are patent with the left supplying the basilar in the right terminating in PICA. The basilar artery is widely patent. There are large right and small left posterior communicating arteries with absence of the right P1 segment. Both PCAs are patent without evidence of a significant proximal stenosis. No aneurysm is identified. Venous sinuses: As permitted by contrast timing, patent. Anatomic variants: Hypoplastic right vertebral artery terminating in PICA. Fetal origin of the right PCA. Review of the MIP images confirms the above findings These results were communicated to Dr. Otelia Limes at 7:16 pm on 11/22/2022 by text page via the [facility] messaging system. IMPRESSION: No large vessel occlusion or stenosis in the head or neck. Electronically Signed   By: Sebastian Ache M.D.   On: 11/22/2022 19:26   CT HEAD CODE STROKE WO CONTRAST  Result Date: 11/22/2022 CLINICAL DATA:  Code stroke. Neuro deficit, acute, stroke suspected. Left facial droop, left-sided weakness and sensory deficit, and slurred speech. Headache. These results were communicated to Dr. Otelia Limes at 7:16 pm on 11/22/2022 by text page via the [facility] messaging system. EXAM: CT HEAD WITHOUT  CONTRAST TECHNIQUE: Contiguous axial images were obtained from the base of the skull through the vertex without intravenous contrast. RADIATION DOSE REDUCTION: This exam was performed according to the departmental dose-optimization program which includes automated exposure control, adjustment of the mA and/or kV according to patient size and/or use of iterative reconstruction technique. COMPARISON:  Head CT 09/22/2022. Head MRI 09/01/2022. Head and neck CTA 08/29/2022. FINDINGS: Brain: Hypodensities in the pons and medulla are felt to most likely reflect artifact from beam hardening, with similar artifact in these regions on the prior CT and a normal appearance on the prior MRI. No acute supratentorial infarct, intracranial hemorrhage, mass, midline shift, or extra-axial fluid collection is identified. The ventricles and sulci are normal. Vascular: No hyperdense vessel. Skull: No acute fracture or  suspicious osseous lesion. Sinuses/Orbits: Clear paranasal sinuses. Left mastoidectomy. Unremarkable orbits. Other: None. ASPECTS (Alberta Stroke Program Early CT Score) - Ganglionic level infarction (caudate, lentiform nuclei, internal capsule, insula, M1-M3 cortex): 7 - Supraganglionic infarction (M4-M6 cortex): 3 Total score (0-10 with 10 being normal): 10 These results were communicated to Dr. Otelia Limes at 7:16 pm on 11/22/2022 by text page via the [facility] messaging system. IMPRESSION: No evidence of acute intracranial abnormality. ASPECTS of 10. Electronically Signed   By: Sebastian Ache M.D.   On: 11/22/2022 19:16      Medications Ordered in ED Medications  sodium chloride flush (NS) 0.9 % injection 3 mL (3 mLs Intravenous Not Given 11/22/22 1921)  amLODipine (NORVASC) tablet 10 mg (has no administration in time range)  ARIPiprazole (ABILIFY) tablet 5 mg (has no administration in time range)  hydrochlorothiazide (HYDRODIURIL) tablet 25 mg (has no administration in time range)  levETIRAcetam (KEPPRA) tablet 1,000 mg  (has no administration in time range)  levothyroxine (SYNTHROID) tablet 88 mcg (has no administration in time range)  pantoprazole (PROTONIX) EC tablet 40 mg (has no administration in time range)  venlafaxine XR (EFFEXOR-XR) 24 hr capsule 150 mg (has no administration in time range)  ziprasidone (GEODON) injection 20 mg (has no administration in time range)  iohexol (OMNIPAQUE) 350 MG/ML injection 75 mL (75 mLs Intravenous Contrast Given 11/22/22 1900)    ED Course/ Medical Decision Making/ A&P                                Medical Decision Making Problems Addressed: Suicidal ideation: complicated acute illness or injury Weakness: complicated acute illness or injury  Amount and/or Complexity of Data Reviewed External Data Reviewed: notes. Labs: ordered. Radiology: ordered and independent interpretation performed.  Risk Prescription drug management.   Alisha Webster is a 52 y.o. female with PMHX of hypothyroidism, essential hypertension, seizures, homelessness  who presents w/ left sided weakness and slurred speech, concerning for a stroke. LVO CODE STROKE was initiated on this patient.   The patient was quickly assessed by myself and the attending. They were protecting/maintaining their own airway.The stroke team was emergently consulted. A through neurological exam was performed, and pertinent findings include decreased strength in the left upper extremity but not tense any paresis.  No obvious facial droop.  Dysarthria but no aphasia. Labs performed and resulted above. Pertinent lab findings include mild hypokalemia at 3.2.   CT scanner was made available and the patient was rapidly transported. Head CT full report above, but in summary revealed no acute abnormalities.   Neurology was available early in the treatment course of this patient.  Suspect that the symptoms are likely to be related to prior presentations with seizure-like activity more consistent with functional neurologic  disorder versus substance related.  However, given risk of vasospasm with her prior amphetamine use, will obtain an MRI for further evaluation.  MRI brain ordered and negative for any acute abnormalities.  Patient this point was stable for discharge.  Upon performing her of plan for discharge with close outpatient follow-up, she states that she will not return to the streets.  She states "if I have to go back to the streets, I will kill myself.".  She is offered social work assistance, however we do not have social work at night that she would need to wait for their arrival in the morning in the lobby.  She states that she is unable to  sit in the lobby overnight due to recent neck surgery.  Instead of sitting in the lobby, she will go out to the streets and kill her self.  She reports that she has medications in her purse right now with a plan to overdose.  She reports a prior overdose attempt.  Given patient's suicidal ideation, we will plan for psychiatric evaluation.  Consult is ordered.  She is medically cleared.  She is currently awaiting psychiatric evaluation for further disposition.  Pt was discussed with my attending, Dr. Freida Busman.   Final Clinical Impression(s) / ED Diagnoses Final diagnoses:  Weakness  Suicidal ideation    Rx / DC Orders ED Discharge Orders     None         Rhys Martini, DO 11/23/22 Jennette Dubin, MD 11/25/22 [phone]

## 2022-11-22 NOTE — ED Notes (Addendum)
Pt presents w/ L sided facial droop, slurred speech, sensory deficit to L side, and weakness to L arm and L leg.  Pt reports waking up w/ a headache.  Sts a friend noticed her slurred speech around 1700.  Sts she went to bed around midnight last night.  Hx of TIA and seizures.     Consulting civil engineer notified.

## 2022-11-22 NOTE — Consult Note (Signed)
Neurology Consultation  Reason for Consult: Code stroke-patient arrived via POV-left facial droop slurred speech left-sided weakness left-sided sensory deficits Referring Physician: Dr. Lorre Nick  CC: Code stroke-patient arrived via POV-left facial droop slurred speech left-sided weakness left-sided sensory deficits  History is obtained from: Chart, patient  HPI: Alisha Webster is a 52 y.o. female past history of anxiety depression hyperlipidemia hypertension illicit drug use presenting to the emergency department with sudden onset of left-sided slurred speech left arm numbness and weakness with last known well somewhere around midnight.  She has been seen for similar symptoms at [facility] last month and was attributed to her illicit drug use.  She has also been recently discharged from this hospital 2 days ago for orthostatic hypotension, AKI and diarrhea management. Reports stroke symptoms similar to prior strokelike episode which made her come to the ED.  Initially reluctant to come but on insistence of family came into the ER. Denies headaches.  Denies chest pain shortness of breath.  Reported dizziness as and lightheadedness.   LKW: 0001 hrs. IV thrombolysis given?: no, outside the window EVT: No evidence of LVO Premorbid modified Rankin scale (mRS): 0 ROS: Full ROS was performed and is negative except as noted in the HPI.   Past Medical History:  Diagnosis Date   Anxiety    Back pain    Depression    Hyperlipidemia    Hypertension     History reviewed. No pertinent family history.  Social History:   reports that she has never smoked. She has never used smokeless tobacco. She reports that she does not drink alcohol and does not use drugs.  Medications  Current Facility-Administered Medications:    sodium chloride flush (NS) 0.9 % injection 3 mL, 3 mL, Intravenous, Once, Lorre Nick, MD  Current Outpatient Medications:    ARIPiprazole (ABILIFY) 5 MG tablet,  Take 5 mg by mouth daily., Disp: , Rfl:    busPIRone (BUSPAR) 10 MG tablet, Take 10 mg by mouth 2 (two) times daily., Disp: , Rfl:    gabapentin (NEURONTIN) 100 MG capsule, Take 200 mg by mouth 3 (three) times daily., Disp: , Rfl:    hydrochlorothiazide (HYDRODIURIL) 25 MG tablet, Take 25 mg by mouth daily., Disp: , Rfl:    levETIRAcetam (KEPPRA) 1000 MG tablet, Take 1,000 mg by mouth 2 (two) times daily., Disp: , Rfl:    levothyroxine (SYNTHROID) 88 MCG tablet, Take 88 mcg by mouth in the morning., Disp: , Rfl:    oxycodone-acetaminophen (LYNOX) 10-300 MG tablet, Take 1 tablet by mouth every 6 (six) hours as needed for pain., Disp: , Rfl:    potassium chloride SA (KLOR-CON M) 20 MEQ tablet, Take 20 mEq by mouth 2 (two) times daily., Disp: , Rfl:    venlafaxine XR (EFFEXOR-XR) 75 MG 24 hr capsule, Take 150 mg by mouth in the morning., Disp: , Rfl:    VITAMIN D PO, Take 1 tablet by mouth daily., Disp: , Rfl:   Exam: Current vital signs: BP (!) 135/116   Pulse 83   Temp 97.7 F (36.5 C) (Temporal)   Resp 16   SpO2 98%  Vital signs in last 24 hours: Temp:  [97.7 F (36.5 C)] 97.7 F (36.5 C) (08/25 1938) Pulse Rate:  [81-95] 83 (08/25 1945) Resp:  [10-25] 16 (08/25 1945) BP: (130-136)/(84-116) 135/116 (08/25 1945) SpO2:  [97 %-99 %] 98 % (08/25 1945) General: Awake alert in no distress HEENT normocephalic atraumatic Lungs: Clear Cardiovascular: Regular rhythm Neurological examination  Awake alert oriented x 3 Speech is mildly dysarthric No aphasia Cranial nerves: Pupils equal round react light, Movements intact, visual fields full, face is mildly asymmetric with left-sided weakness of the lower face which appears somewhat volitional, facial sensation diminished on the left with a sharp cut off in the midline.  Tongue and palate midline. Motor examination with mild weakness with no drift in the left upper or lower extremity but on individual muscle testing is weaker when compared to  the right with 4/5 strength. Sensory exam: Diminished on the left with a sharp cut off in the midline. Coordination essentially intact NIH stroke scale-1 for sensory, 1 for dysarthria-total 2  Labs I have reviewed labs in epic and the results pertinent to this consultation are:  CBC    Component Value Date/Time   WBC 6.7 11/22/2022 1919   RBC 4.18 11/22/2022 1919   HGB 12.6 11/22/2022 1931   HCT 37.0 11/22/2022 1931   PLT 289 11/22/2022 1919   MCV 90.0 11/22/2022 1919   MCH 30.6 11/22/2022 1919   MCHC 34.0 11/22/2022 1919   RDW 12.0 11/22/2022 1919   LYMPHSABS 2.3 11/22/2022 1919   MONOABS 0.4 11/22/2022 1919   EOSABS 0.2 11/22/2022 1919   BASOSABS 0.0 11/22/2022 1919    CMP     Component Value Date/Time   NA 140 11/22/2022 1931   K 3.3 (L) 11/22/2022 1931   CL 103 11/22/2022 1931   CO2 24 11/22/2022 1919   GLUCOSE 167 (H) 11/22/2022 1931   BUN 14 11/22/2022 1931   CREATININE 1.00 11/22/2022 1931   CREATININE 0.68 09/02/2011 1130   CALCIUM 8.8 (L) 11/22/2022 1919   PROT 6.0 (L) 11/22/2022 1919   ALBUMIN 3.3 (L) 11/22/2022 1919   AST 29 11/22/2022 1919   ALT 26 11/22/2022 1919   ALKPHOS 91 11/22/2022 1919   BILITOT 0.7 11/22/2022 1919   GFRNONAA >60 11/22/2022 1919   GFRAA >60 05/06/2018 1851   Imaging I have reviewed the images obtained:  CT-head-no acute changes CT angio head and neck-no emergent LVO  Assessment:  52 year old with above past medical history presenting for evaluation of sudden onset left-sided symptoms with last known well outside the window for IV tPA.  No emergent LVO at 4 consideration for EVT. Clinical exam with some left hemiparesis with nonorganic findings. Has a history of drug abuse and similar symptoms in the setting of drug abuse. I have low suspicion for stroke but given her amphetamine abuse etc.-I would get an MRI to rule out a small stroke. If the MRI is negative, no further workup  Impression: Strokelike symptoms in the  setting of drug use-evaluate for stroke versus nonorganic symptoms  Recommendations: Urinary toxicology screen MRI brain without contrast If the MRI is positive for stroke, will need admission for stroke workup but if negative, no further inpatient workup Plan was discussed with Dr. Freida Busman and ED resident physician.   Addendum MRI brain completed and reviewed personally.  No evidence of acute infarction or acute process. No further inpatient neurological workup at this time Plan was relayed to EDP.  -- Milon Dikes, MD Neurologist [facility] Pager: [phone]

## 2022-11-23 ENCOUNTER — Other Ambulatory Visit: Payer: Self-pay

## 2022-11-23 ENCOUNTER — Encounter (HOSPITAL_COMMUNITY): Payer: Self-pay | Admitting: Psychiatry

## 2022-11-23 ENCOUNTER — Inpatient Hospital Stay (HOSPITAL_COMMUNITY)
Admission: AD | Admit: 2022-11-23 | Discharge: 2022-12-02 | DRG: 885 | Disposition: A | Discharge status: Home / Self Care | Payer: Medicaid Other | Source: Intra-hospital | Attending: Psychiatry | Admitting: Psychiatry

## 2022-11-23 DIAGNOSIS — Z1152 Encounter for screening for COVID-19: Secondary | ICD-10-CM

## 2022-11-23 DIAGNOSIS — R4781 Slurred speech: Secondary | ICD-10-CM | POA: Diagnosis present

## 2022-11-23 DIAGNOSIS — Z5941 Food insecurity: Secondary | ICD-10-CM

## 2022-11-23 DIAGNOSIS — G40909 Epilepsy, unspecified, not intractable, without status epilepticus: Secondary | ICD-10-CM | POA: Diagnosis present

## 2022-11-23 DIAGNOSIS — G8929 Other chronic pain: Secondary | ICD-10-CM | POA: Diagnosis present

## 2022-11-23 DIAGNOSIS — F332 Major depressive disorder, recurrent severe without psychotic features: Secondary | ICD-10-CM | POA: Diagnosis present

## 2022-11-23 DIAGNOSIS — Z79899 Other long term (current) drug therapy: Secondary | ICD-10-CM | POA: Diagnosis not present

## 2022-11-23 DIAGNOSIS — F410 Panic disorder [episodic paroxysmal anxiety] without agoraphobia: Secondary | ICD-10-CM | POA: Diagnosis present

## 2022-11-23 DIAGNOSIS — Z5982 Transportation insecurity: Secondary | ICD-10-CM | POA: Diagnosis not present

## 2022-11-23 DIAGNOSIS — Z981 Arthrodesis status: Secondary | ICD-10-CM

## 2022-11-23 DIAGNOSIS — F411 Generalized anxiety disorder: Secondary | ICD-10-CM | POA: Insufficient documentation

## 2022-11-23 DIAGNOSIS — F329 Major depressive disorder, single episode, unspecified: Secondary | ICD-10-CM | POA: Diagnosis present

## 2022-11-23 DIAGNOSIS — Z888 Allergy status to other drugs, medicaments and biological substances status: Secondary | ICD-10-CM

## 2022-11-23 DIAGNOSIS — Z79890 Hormone replacement therapy (postmenopausal): Secondary | ICD-10-CM | POA: Diagnosis not present

## 2022-11-23 DIAGNOSIS — E039 Hypothyroidism, unspecified: Secondary | ICD-10-CM | POA: Diagnosis present

## 2022-11-23 DIAGNOSIS — E785 Hyperlipidemia, unspecified: Secondary | ICD-10-CM | POA: Diagnosis present

## 2022-11-23 DIAGNOSIS — Z560 Unemployment, unspecified: Secondary | ICD-10-CM

## 2022-11-23 DIAGNOSIS — F4310 Post-traumatic stress disorder, unspecified: Secondary | ICD-10-CM | POA: Diagnosis present

## 2022-11-23 DIAGNOSIS — I10 Essential (primary) hypertension: Secondary | ICD-10-CM | POA: Diagnosis present

## 2022-11-23 DIAGNOSIS — R45851 Suicidal ideations: Secondary | ICD-10-CM | POA: Diagnosis present

## 2022-11-23 DIAGNOSIS — R29810 Facial weakness: Secondary | ICD-10-CM | POA: Diagnosis present

## 2022-11-23 DIAGNOSIS — Z818 Family history of other mental and behavioral disorders: Secondary | ICD-10-CM

## 2022-11-23 DIAGNOSIS — Z5986 Financial insecurity: Secondary | ICD-10-CM | POA: Diagnosis not present

## 2022-11-23 DIAGNOSIS — G8194 Hemiplegia, unspecified affecting left nondominant side: Secondary | ICD-10-CM | POA: Diagnosis present

## 2022-11-23 DIAGNOSIS — F1510 Other stimulant abuse, uncomplicated: Secondary | ICD-10-CM | POA: Diagnosis present

## 2022-11-23 DIAGNOSIS — Z5902 Unsheltered homelessness: Secondary | ICD-10-CM

## 2022-11-23 DIAGNOSIS — E876 Hypokalemia: Secondary | ICD-10-CM | POA: Diagnosis present

## 2022-11-23 DIAGNOSIS — R200 Anesthesia of skin: Secondary | ICD-10-CM | POA: Diagnosis not present

## 2022-11-23 DIAGNOSIS — F419 Anxiety disorder, unspecified: Secondary | ICD-10-CM | POA: Insufficient documentation

## 2022-11-23 MED ORDER — LORAZEPAM 2 MG/ML IJ SOLN: 2.0000 mg | Freq: Three times a day (TID) | INTRAMUSCULAR | Status: DC | PRN

## 2022-11-23 MED ORDER — ALUM & MAG HYDROXIDE-SIMETH 200-200-20 MG/5ML PO SUSP: 30.0000 mL | ORAL | Status: DC | PRN

## 2022-11-23 MED ORDER — HYDROCHLOROTHIAZIDE 25 MG PO TABS
25.0000 mg | ORAL_TABLET | Freq: Every day | ORAL | Status: DC
  Administered 2022-11-25 – 2022-12-02 (×5): 25 mg via ORAL
  Filled 2022-11-23 (×11): qty 1

## 2022-11-23 MED ORDER — ACETAMINOPHEN 325 MG PO TABS
650.0000 mg | ORAL_TABLET | Freq: Four times a day (QID) | ORAL | Status: DC | PRN
  Administered 2022-11-24 – 2022-11-28 (×2): 650 mg via ORAL
  Filled 2022-11-23 (×2): qty 2

## 2022-11-23 MED ORDER — DIPHENHYDRAMINE HCL 25 MG PO CAPS: 50.0000 mg | ORAL_CAPSULE | Freq: Three times a day (TID) | ORAL | Status: DC | PRN

## 2022-11-23 MED ORDER — ZIPRASIDONE MESYLATE 20 MG IM SOLR: 20.0000 mg | Freq: Once | INTRAMUSCULAR | Status: DC

## 2022-11-23 MED ORDER — HALOPERIDOL LACTATE 5 MG/ML IJ SOLN
5.0000 mg | Freq: Three times a day (TID) | INTRAMUSCULAR | Status: DC | PRN
  Administered 2022-12-01: 5 mg via INTRAMUSCULAR
  Filled 2022-11-23: qty 1

## 2022-11-23 MED ORDER — POTASSIUM CHLORIDE CRYS ER 20 MEQ PO TBCR
40.0000 meq | EXTENDED_RELEASE_TABLET | Freq: Once | ORAL | Status: AC
  Administered 2022-11-23: 40 meq via ORAL
  Filled 2022-11-23: qty 2

## 2022-11-23 MED ORDER — PANTOPRAZOLE SODIUM 40 MG PO TBEC
40.0000 mg | DELAYED_RELEASE_TABLET | Freq: Every day | ORAL | Status: DC
  Administered 2022-11-25 – 2022-12-02 (×5): 40 mg via ORAL
  Filled 2022-11-23 (×11): qty 1

## 2022-11-23 MED ORDER — HYDROXYZINE HCL 25 MG PO TABS
25.0000 mg | ORAL_TABLET | Freq: Three times a day (TID) | ORAL | Status: DC | PRN
  Administered 2022-11-25 – 2022-11-27 (×2): 25 mg via ORAL
  Filled 2022-11-23 (×3): qty 1

## 2022-11-23 MED ORDER — LEVETIRACETAM 500 MG PO TABS
1000.0000 mg | ORAL_TABLET | Freq: Two times a day (BID) | ORAL | Status: DC
  Administered 2022-11-23 – 2022-12-02 (×11): 1000 mg via ORAL
  Filled 2022-11-23 (×22): qty 2

## 2022-11-23 MED ORDER — MAGNESIUM HYDROXIDE 400 MG/5ML PO SUSP: 30.0000 mL | Freq: Every day | ORAL | Status: DC | PRN

## 2022-11-23 MED ORDER — ARIPIPRAZOLE 5 MG PO TABS
5.0000 mg | ORAL_TABLET | Freq: Every day | ORAL | Status: DC
  Administered 2022-11-25 – 2022-12-02 (×5): 5 mg via ORAL
  Filled 2022-11-23 (×11): qty 1

## 2022-11-23 MED ORDER — TRAZODONE HCL 50 MG PO TABS
50.0000 mg | ORAL_TABLET | Freq: Every evening | ORAL | Status: DC | PRN
  Filled 2022-11-23: qty 1

## 2022-11-23 MED ORDER — LORAZEPAM 1 MG PO TABS: 2.0000 mg | ORAL_TABLET | Freq: Three times a day (TID) | ORAL | Status: DC | PRN

## 2022-11-23 MED ORDER — LEVOTHYROXINE SODIUM 88 MCG PO TABS
88.0000 ug | ORAL_TABLET | Freq: Every morning | ORAL | Status: DC
  Administered 2022-11-24 – 2022-12-01 (×7): 88 ug via ORAL
  Filled 2022-11-23 (×11): qty 1

## 2022-11-23 MED ORDER — HALOPERIDOL 5 MG PO TABS: 5.0000 mg | ORAL_TABLET | Freq: Three times a day (TID) | ORAL | Status: DC | PRN

## 2022-11-23 MED ORDER — DIPHENHYDRAMINE HCL 50 MG/ML IJ SOLN
50.0000 mg | Freq: Three times a day (TID) | INTRAMUSCULAR | Status: DC | PRN
  Administered 2022-12-01: 50 mg via INTRAMUSCULAR
  Filled 2022-11-23: qty 1

## 2022-11-23 MED ORDER — VENLAFAXINE HCL ER 150 MG PO CP24
150.0000 mg | ORAL_CAPSULE | Freq: Every morning | ORAL | Status: DC
  Administered 2022-11-25 – 2022-11-30 (×5): 150 mg via ORAL
  Filled 2022-11-23 (×11): qty 1

## 2022-11-23 NOTE — Progress Notes (Signed)
Homeless resources have been added to patients AVS. Patient is being evaluated by psychiatry.
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
## 2022-11-23 NOTE — ED Notes (Signed)
Belongings taken to [facility] in [place] by ED tech.  This RN to send medications to pharmacy for storage until pt is discharged.

## 2022-11-23 NOTE — ED Provider Notes (Signed)
Emergency Medicine Observation Re-evaluation Note  Alisha Webster is a 52 y.o. female, seen on rounds today.  Pt initially presented to the ED for complaints of Code Stroke and Facial Droop Currently, the patient is resting .  Physical Exam  BP 119/69   Pulse 78   Temp 97.7 F (36.5 C) (Temporal)   Resp 19   SpO2 98%  Physical Exam General: NAD   ED Course / MDM  EKG:   I have reviewed the labs performed to date as well as medications administered while in observation.  Recent changes in the last 24 hours include no acute events reported.  Plan  Current plan is for pending TTS/Psych evaluation.    Wynetta Fines, MD 11/23/22 [phone]

## 2022-11-23 NOTE — Progress Notes (Signed)
Pt has been accepted to [facility] TODAY 11/23/2022, pending UDS and voluntary consent. Bed assignment: 402-1  Pt meets inpatient criteria per Alona Bene, PMHNP  Attending Physician will be Phineas Inches, MD  Report can be called to: - Adult unit: [phone]  Pt can arrive after pending items are received  Care Team Notified: [facility] Rona Ravens, RN, Patrick North, RN, Myrlene Broker, RN, and [facility], PMHNP   [place], [place]  11/23/2022 11:40 AM

## 2022-11-23 NOTE — ED Notes (Addendum)
Pt endorsing SI to provider; states she will take all of her medications in her bag once she is discharged.  Pt being IVC'd by provider for same.  Pt attempted to walk to lobby; stopped by security due to IVC.  Pt refusing to change into purple scrubs and being uncooperative with staff.  Security at bedside.

## 2022-11-23 NOTE — [facility] Group Notes (Signed)
[facility] Group Notes:  (Nursing/MHT/Case Management/Adjunct)  Date:  11/23/2022  Time:  11:46 PM  Type of Therapy:  Psychoeducational Skills  Participation Level:  Minimal  Participation Quality:  Attentive  Affect:  Appropriate  Cognitive:  Appropriate  Insight:  Good  Engagement in Group:  Developing/Improving  Modes of Intervention:  Support  Summary of Progress/Problems: The patient attended the evening A.A.meeting and was appropriate.   Alisha Webster 11/23/2022, 11:46 PM
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
## 2022-11-23 NOTE — ED Notes (Signed)
Pt continues to be uncooperative with staff.  Required ED staff and security to change into scrub pants.

## 2022-11-23 NOTE — ED Notes (Signed)
ED staff attempted to discharge pt.  Pt refusing to discharge, cites issues with housing.  Provider to speak with pt at bedside.

## 2022-11-23 NOTE — ED Notes (Signed)
IVC paperwork complete and in orange zone, expires 11/30/22, original in red folder, case # [id number]

## 2022-11-23 NOTE — Tx Team (Signed)
Initial Treatment Plan 11/23/2022 6:34 PM Alisha Webster [id number]    PATIENT STRESSORS: Health problems   Substance abuse   Traumatic event     PATIENT STRENGTHS: Communication skills    PATIENT IDENTIFIED PROBLEMS: Anxiety  Depression  Substance abuse                 DISCHARGE CRITERIA:  Improved stabilization in mood, thinking, and/or behavior Reduction of life-threatening or endangering symptoms to within safe limits  PRELIMINARY DISCHARGE PLAN: Outpatient therapy  PATIENT/FAMILY INVOLVEMENT: This treatment plan has been presented to and reviewed with the patient, has been given the opportunity to ask questions and make suggestions.  Alisha Needles, RN 11/23/2022, 6:34 PM

## 2022-11-23 NOTE — ED Provider Notes (Signed)
Pt accepted to [facility], Dr Sherron Flemings.   Pt alert, nad, vitals normal.  Will ask staff to send uds as requested, although given prior uds's in computer, including from 10 days ago that was +meth, and c/w patients states ongoing meth use, uds should not delay patient movement to [facility].  Pt currently appears stable for transfer/admit to [facility].     Cathren Laine, MD 11/23/22 [phone]

## 2022-11-23 NOTE — ED Notes (Signed)
IVC paperwork moved to [facility] Zone Nurses' station.

## 2022-11-23 NOTE — ED Notes (Signed)
Pt belongings in [facility] locker 3

## 2022-11-23 NOTE — ED Provider Notes (Signed)
Emergency Medicine Observation Re-evaluation Note  Everline Flow is a 52 y.o. female, seen on rounds today.  Pt initially presented to the ED for complaints of Code Stroke and Facial Droop Currently, the patient is asleep in her room, wakes when I entered, and answer questions appropriately.  Physical Exam  BP 119/69   Pulse 78   Temp 97.7 F (36.5 C) (Temporal)   Resp 19   SpO2 98%  Physical Exam Constitutional:      Appearance: Normal appearance.  Cardiovascular:     Rate and Rhythm: Normal rate.  Pulmonary:     Effort: Pulmonary effort is normal.  Neurological:     Mental Status: She is alert.  Psychiatric:        Mood and Affect: Mood normal.      ED Course / MDM  EKG:   I have reviewed the labs performed to date as well as medications administered while in observation.  Recent changes in the last 24 hours include a negative MRI, and apparently the patient refused to speak with TTS.  I evaluated the patient, she was still endorsing a mild headache.  I informed the patient that fortunately her MRI was negative.  She does not have any neurological deficits at this time.  I asked patient what been going on, and she told me that she told yesterday that if she was discharged she would "kill myself."  I inquired bit more about this patient's plan, and she did not have a plan.  She says that she has nowhere to go and nowhere to stay.  Patient did not endorse any of these thoughts of self-harm until she was approaching discharge.  I do think that there is an element of secondary gain in the patient's statements.  Patient will be evaluated today.  Patient has been evaluated by psychiatry and they are recommending admission. Plan  Current plan is for inpatient admission.    Anders Simmonds T, DO 11/23/22 1145

## 2022-11-23 NOTE — Plan of Care (Signed)

## 2022-11-23 NOTE — ED Notes (Signed)
Pt transported to [facility] 402-1 with [employer] officers x2. 3 IVC copies handed to officer along with patient belongings. Pt wheeled out at this time to be transferred to [facility]. Pt is a&ox4,vss,nad. Pt is cooperative and agreeable to plan at this time. Pt ambulated to wheelchair with steady gait.

## 2022-11-23 NOTE — Progress Notes (Signed)
   11/23/22 2135  Psych Admission Type (Psych Patients Only)  Admission Status Involuntary  Psychosocial Assessment  Patient Complaints None  Eye Contact Avoids  Facial Expression Flat  Affect Appropriate to circumstance  Speech Logical/coherent  Interaction Isolative;Minimal  Motor Activity Slow  Appearance/Hygiene Disheveled  Behavior Characteristics Cooperative;Appropriate to situation  Mood Pleasant  Aggressive Behavior  Effect No apparent injury  Thought Process  Coherency WDL  Content WDL  Delusions WDL  Perception WDL  Hallucination None reported or observed  Judgment Impaired  Confusion WDL  Danger to Self  Current suicidal ideation? Denies  Self-Injurious Behavior No self-injurious ideation or behavior indicators observed or expressed   Agreement Not to Harm Self Yes  Description of Agreement verbal  Danger to Others  Danger to Others None reported or observed

## 2022-11-23 NOTE — BH Assessment (Signed)
Received TTS consult order. Per RN, Pt is currently uncooperative. RN will contact TTS when Pt is ready to participate in assessment.   Pamalee Leyden, [facility], [facility] Triage Specialist

## 2022-11-23 NOTE — ED Notes (Signed)
Pt's medications placed in 2 secure bags and taken to main pharmacy.  Pt had large number of medications in multiple bottles.  Bag 1 Serial Number: [id number] Bag 2 Serial Number: [id number]

## 2022-11-24 DIAGNOSIS — F411 Generalized anxiety disorder: Secondary | ICD-10-CM | POA: Insufficient documentation

## 2022-11-24 DIAGNOSIS — F419 Anxiety disorder, unspecified: Secondary | ICD-10-CM | POA: Insufficient documentation

## 2022-11-24 MED ORDER — IBUPROFEN 600 MG PO TABS
600.0000 mg | ORAL_TABLET | Freq: Four times a day (QID) | ORAL | Status: DC | PRN
  Administered 2022-11-25 – 2022-11-27 (×3): 600 mg via ORAL
  Filled 2022-11-24 (×3): qty 1

## 2022-11-24 MED ORDER — LOPERAMIDE HCL 2 MG PO CAPS
2.0000 mg | ORAL_CAPSULE | ORAL | Status: DC | PRN
  Administered 2022-11-25 – 2022-12-02 (×5): 2 mg via ORAL
  Filled 2022-11-24 (×5): qty 1

## 2022-11-24 NOTE — Plan of Care (Signed)

## 2022-11-24 NOTE — Progress Notes (Signed)
Patient refused all medications this morning. Patient given tylenol po prn due to head pain although she prefers ibuprofen due to liver problems. MD notified. Patient noted to have minimal interaction and remains mostly in room.     11/24/22 0900  Psych Admission Type (Psych Patients Only)  Admission Status Involuntary  Psychosocial Assessment  Patient Complaints None  Eye Contact Poor  Facial Expression Flat  Affect Depressed  Speech Logical/coherent  Interaction Minimal  Motor Activity Slow  Appearance/Hygiene Disheveled  Behavior Characteristics Guarded  Mood Depressed;Anxious  Thought Process  Coherency WDL  Content WDL  Delusions None reported or observed  Perception WDL  Hallucination None reported or observed  Judgment Impaired  Confusion None  Danger to Self  Current suicidal ideation? Denies  Self-Injurious Behavior No self-injurious ideation or behavior indicators observed or expressed   Agreement Not to Harm Self Yes  Description of Agreement verbal  Danger to Others  Danger to Others None reported or observed

## 2022-11-24 NOTE — Progress Notes (Signed)
Patient refused 1700 scheduled medication. When attempting to ask reasoning for refusal patient unable to verbalize why.

## 2022-11-24 NOTE — Group Note (Signed)
Date:  11/24/2022 Time:  9:36 PM  Group Topic/Focus:  Wrap-Up Group:   The focus of this group is to help patients review their daily goal of treatment and discuss progress on daily workbooks.    Participation Level:  Did Not Attend  Participation Quality:   N/A  Affect:   N/A  Cognitive:   N/A  Insight: None  Engagement in Group:  None  Modes of Intervention:   N/A  Additional Comments:  Patient did not attend group.   Kennieth Francois 11/24/2022, 9:36 PM

## 2022-11-24 NOTE — Plan of Care (Signed)
  Problem: Health Behavior/Discharge Planning: Goal: Identification of resources available to assist in meeting health care needs will improve Outcome: Progressing   Problem: Safety: Goal: Periods of time without injury will increase Outcome: Progressing

## 2022-11-24 NOTE — Group Note (Signed)
Recreation Therapy Group Note   Group Topic:Animal Assisted Therapy   Group Date: 11/24/2022 Start Time: 1000 End Time: 1030 Facilitators: Taylormarie Register-McCall, LRT,CTRS Location: [address] Dayroom   Animal-Assisted Activity (AAA) Program Checklist/Progress Notes Patient Eligibility Criteria Checklist & Daily Group note for Rec Tx Intervention  AAA/T Program Assumption of Risk Form signed by Patient/ or Parent Legal Guardian Yes  Patient understands his/her participation is voluntary Yes   Affect/Mood: N/A   Participation Level: Did not attend    Clinical Observations/Individualized Feedback:     Plan: Continue to engage patient in RT group sessions 2-3x/week.   Maheen Cwikla-McCall, LRT,CTRS 11/24/2022 1:03 PM

## 2022-11-24 NOTE — H&P (Signed)
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 indigestion  Magnesium hydroxide 30 mL suspension daily PRN mild constipation      3. Medical Issues Being Addressed:   Hypertension  Start home regimen: hydrochlorothiazide 25 mg PO daily BP has remained stable with no concerns at this time  OP PCP for further management   Hypothyroidism  Start home regimen: Synthroid 88 mcg PO every morning   Unspecified seizure disorder  Ddx: primary seizure disorder vs psychogenic seizures vs substance related seizures  Start home regimen: Keppra 1000 mg BID which has the added benefit of mood stabilization  Recommend OP neurology follow up   Heartburn Home regimen of omeprazole 20 mg daily, which is  non-formulary Alternatively start pantoprazole 40 mg daily   Chronic pain  Patient requesting pain medication. Told that she has PRN Tylenol. Patient states that she avoids this medication due to underlying "liver disease." Chart reviews elevated AST/ALT: 104/69 in 2020 but LFTs have been otherwise normal.  Initiate Ibuprofen 600 mg every 6 hours PRN pain, as this is a better option for inflammatory pain  Consider PMR referral    History of stroke-like symptoms Ddx: CVA/TIA vs functional neurologic disorder vs atypical seizures  Workup on 8/25 was reassuring with no evidence suggesting an acute vascular process  No further management recommended at this time  Diarrhea, unknown etiology  Ddx: IBS vs infectious etiology  Initiate loperamide 2 mg PRN for diarrhea/loose stools Less likely bacterial given negative GI panel on 8/22, consider C.diff testing if symptoms continue/worsen  Provide supportive care and encourage adequate fluid intake   Pertinent & Abnormal Labs: obtained at [facility] ED on 8/25 Potassium: 3.2-3.3 Creatinine: 1.09 -> 1.00 Glucose: 177 -> 167 CBC with Diff: WNL  BAL: < 10    4. Group Therapy: Encouraged patient to participate in unit milieu and in scheduled group therapies  Short Term Goals: Ability to identify changes in lifestyle to reduce recurrence of condition will improve, Ability to verbalize feelings will improve, Ability to disclose and discuss suicidal ideas, Ability to demonstrate self-control will improve, and Ability to identify and develop effective coping behaviors will improve Long Term Goals: Improvement in symptoms so as ready for discharge   5. Discharge Planning:  Social work and case management to assist with discharge planning and identification of hospital follow-up needs prior to discharge Estimated LOS: 5-7 days Discharge Concerns: Need to establish a safety plan; Medication compliance and effectiveness Discharge Goals: Return home  with outpatient referrals for mental health follow-up including medication management/psychotherapy.  Contract for safety, establish safety plan prior to discharge    Disposition: Continue with inpatient level of psychiatric care for further assessment, medication management, and safety planning  I certify that inpatient services furnished can reasonably be expected to improve the patient's condition.     Total Time  Spent in Direct Patient Care:  I personally spent 45 minutes on the unit in direct patient care. The direct patient care time included face-to-face time with the patient, reviewing the patient's chart, communicating with other professionals, and coordinating care. Greater than 50% of this time was spent in counseling or coordinating care with the patient regarding goals of hospitalization, psycho-education, and discharge planning needs.    Signed: Dina Rich OMS-4, Psychiatry Acting Intern   8/27/20242:56 PM

## 2022-11-24 NOTE — Group Note (Signed)
[facility] LCSW Group Therapy Note   Group Date: 11/24/2022 Start Time: 1100 End Time: 1145   Type of Therapy/Topic:  Group Therapy:  Emotion Regulation  Participation Level:  Did Not Attend     Description of Group:    The purpose of this group is to assist patients in learning to regulate negative emotions and experience positive emotions. Patients will be guided to discuss ways in which they have been vulnerable to their negative emotions. These vulnerabilities will be juxtaposed with experiences of positive emotions or situations, and patients challenged to use positive emotions to combat negative ones. Special emphasis will be placed on coping with negative emotions in conflict situations, and patients will process healthy conflict resolution skills.  Therapeutic Goals: Patient will identify two positive emotions or experiences to reflect on in order to balance out negative emotions:  Patient will label two or more emotions that they find the most difficult to experience:  Patient will be able to demonstrate positive conflict resolution skills through discussion or role plays:         Therapeutic Modalities:   Cognitive Behavioral Therapy Feelings Identification Dialectical Behavioral Therapy   Alisha Bowlby S Zakari Bathe, LCSW

## 2022-11-24 NOTE — [facility] Counselor (Signed)
Adult Comprehensive Assessment  Patient ID: Alisha Webster, female   DOB: March 09, 1971, 52 y.o.   MRN: [id number]  Information Source: Information source: Patient  Current Stressors:  Patient states their primary concerns and needs for treatment are:: "I have been homeless for 3 weeks and I can't take it anymore." Patient states their goals for this hospitilization and ongoing recovery are:: "Finding housing." Educational / Learning stressors: None reported Employment / Job issues: none reported Family Relationships: none reported Surveyor, quantity / Lack of resources (include bankruptcy): none reported Housing / Lack of housing: "I'm homeless" Physical health (include injuries & life threatening diseases): "I just had neck surgery 5 weeks ago and I need both knees replaced." Social relationships: "I don't really have a support system" Substance abuse: "I use Meth sometimes" Bereavement / Loss: mom passed about 1.5 years ago and Pt reports she is still dealing with the loss.  Living/Environment/Situation:  Living Arrangements: Other (Comment) Living conditions (as described by patient or guardian): Pt reports she has been homeless for the past 1 but usually finds a friends couch to sleep on. However, Pt reprots she has been on the streets the past 3 weeks. Who else lives in the home?: NA How long has patient lived in current situation?: 3 weeks What is atmosphere in current home: Dangerous, Temporary  Family History:  Marital status: Divorced Divorced, when?: 2015/12/23 What types of issues is patient dealing with in the relationship?: none reported Additional relationship information: NA Are you sexually active?: No What is your sexual orientation?: straight Has your sexual activity been affected by drugs, alcohol, medication, or emotional stress?: NA Does patient have children?: Yes How many children?: 1 How is patient's relationship with their children?: "I have 1 daughter but she doesn't live  close so I don't see or talk to her much."  Childhood History:  By whom was/is the patient raised?: Both parents Additional childhood history information: NA Description of patient's relationship with caregiver when they were a child: "Good" Patient's description of current relationship with people who raised him/her: "both my parents have passed." How were you disciplined when you got in trouble as a child/adolescent?: "Like normal" Does patient have siblings?: Yes Number of Siblings: 4 Description of patient's current relationship with siblings: "I have 1 half brother and I had 3 half sisters but one passed away in 23-Dec-2006. 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?: No  Financial Resources:   Surveyor, quantity resources: Sales executive, Medicaid Does patient have a Lawyer or guardian?: No  Alcohol/Substance Abuse:   What has been your use of drugs/alcohol within the last 12 months?: "I only use Meth to help with my pain." If  attempted suicide, did drugs/alcohol play a role in this?: No Alcohol/Substance Abuse Treatment Hx: Denies past  history If yes, describe treatment: NA Has alcohol/substance abuse ever caused legal problems?: Yes (pt reports that she got a DUI in 2019)  Social Support System:   Patient's Community Support System: Fair Museum/gallery exhibitions officer System: "Friends and daughter" Type of faith/religion: "No, not really" How does patient's faith help to cope with current illness?: none reported  Leisure/Recreation:   Do You Have Hobbies?: Yes Leisure and Hobbies: "I play on my phone a lot."  Strengths/Needs:   What is the patient's perception of their strengths?: "I'm determined." Patient states they can use these personal strengths during their treatment to contribute to their recovery: "It will help me figure things out." Patient states these barriers may affect/interfere with their treatment: "Beig homeless." Patient states these barriers may affect their return to the community: NA Other important information patient would like considered in planning for their treatment: NA  Discharge Plan:   Currently receiving community mental health services: Yes (From Whom) (Pt reports that she currently goes to dr. Darrick Penna for medication in [place].) Patient states concerns and preferences for aftercare planning are: "I need to find housing, I refuse to go back to the streets" Patient states they will know when they are safe and ready for discharge when: NA Does patient have access to transportation?: No Does patient have financial barriers related to discharge medications?: No Patient description of barriers related to discharge medications: NA Plan for no access to transportation at discharge: NA Will patient be returning to same living situation after discharge?: Yes  Summary/Recommendations:   Summary and Recommendations (to be completed by the evaluator): [name] is a 51 yo female who presented to [facility] from ED due to SI and substance use. Pt reports her biggest issue is being homeless  and that she just had neck surgery and still needs knee surgery on both knees but can not get it done until she has somewhere to recover. Pt currently see's Dr. Darrick Penna in [facility] for medication and denies the need for a therapist. Pt would like help finding housing but if not would return to the [facility] when discharged. Pt was in bed durig assessment but was willing to asnwer questions.While here, Avishka Statler, can benefit from crisis stabilization, medication management, therapeutic milieu, and referrals for services.   Izell Dozier. 11/24/2022

## 2022-11-25 ENCOUNTER — Encounter (HOSPITAL_COMMUNITY): Payer: Self-pay

## 2022-11-25 LAB — SARS CORONAVIRUS 2 BY RT PCR: SARS Coronavirus 2 by RT PCR: NEGATIVE

## 2022-11-25 MED ORDER — POTASSIUM CHLORIDE 20 MEQ PO PACK
40.0000 meq | PACK | Freq: Two times a day (BID) | ORAL | Status: AC
  Administered 2022-11-26 – 2022-11-27 (×3): 40 meq via ORAL
  Filled 2022-11-25 (×4): qty 2

## 2022-11-25 MED ORDER — POTASSIUM CHLORIDE 20 MEQ PO PACK
40.0000 meq | PACK | Freq: Once | ORAL | Status: AC
  Administered 2022-11-25: 40 meq via ORAL
  Filled 2022-11-25 (×2): qty 2

## 2022-11-25 MED ORDER — ONDANSETRON 4 MG PO TBDP
4.0000 mg | ORAL_TABLET | Freq: Three times a day (TID) | ORAL | Status: AC | PRN
  Administered 2022-11-25 – 2022-11-26 (×3): 4 mg via ORAL
  Filled 2022-11-25 (×3): qty 1
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
## 2022-11-25 NOTE — Group Note (Signed)
Date:  11/25/2022 Time:  10:08 AM  Group Topic/Focus:  Goals Group:   The focus of this group is to help patients establish daily goals to achieve during treatment and discuss how the patient can incorporate goal setting into their daily lives to aide in recovery.    Participation Level:  Did Not Attend   Alisha Webster 11/25/2022, 10:08 AM

## 2022-11-25 NOTE — Progress Notes (Signed)
   11/25/22 1300  Psych Admission Type (Psych Patients Only)  Admission Status Involuntary  Psychosocial Assessment  Patient Complaints Depression  Eye Contact Brief  Facial Expression Flat  Affect Depressed  Speech Logical/coherent  Interaction Isolative  Motor Activity Slow  Appearance/Hygiene Disheveled  Behavior Characteristics Cooperative  Mood Depressed;Anxious  Thought Process  Coherency WDL  Content WDL  Delusions None reported or observed  Perception WDL  Hallucination None reported or observed  Judgment Impaired  Confusion None  Danger to Self  Current suicidal ideation? Denies  Self-Injurious Behavior No self-injurious ideation or behavior indicators observed or expressed   Agreement Not to Harm Self Yes  Description of Agreement verbal  Danger to Others  Danger to Others None reported or observed

## 2022-11-25 NOTE — Plan of Care (Signed)
  Problem: Activity: Goal: Sleeping patterns will improve Outcome: Progressing   Problem: Coping: Goal: Ability to verbalize frustrations and anger appropriately will improve Outcome: Progressing   Problem: Safety: Goal: Periods of time without injury will increase Outcome: Progressing   Problem: Activity: Goal: Interest or engagement in activities will improve Outcome: Not Progressing

## 2022-11-25 NOTE — Progress Notes (Signed)
   11/25/22 0554  15 Minute Checks  Location Bedroom  Visual Appearance Calm  Behavior Sleeping  Sleep (Behavioral Health Patients Only)  Calculate sleep? (Click Yes once per 24 hr at 0600 safety check) Yes  Documented sleep last 24 hours 11

## 2022-11-25 NOTE — Progress Notes (Signed)
PRN tylenol and imodium administered at 0833 for bilateral knee pain rated 10/10 and diarrhea. Patient was asleep when this RN went to assess effectiveness. Patient later reports imodium wasn't effective.

## 2022-11-25 NOTE — Progress Notes (Signed)
Patient complaining of dizziness, shaking, and diarrhea. Vitals normal. MD saw patient. Will continue to monitor.

## 2022-11-25 NOTE — Progress Notes (Signed)
PRN zofran administered at 1212. Patient reports it was not effective.

## 2022-11-25 NOTE — Progress Notes (Signed)
Pt refused AM vital signs. 

## 2022-11-25 NOTE — Progress Notes (Signed)
COVID swab given to transport. Potassium 40 mEq packet administered per MD order. EKG done.

## 2022-11-25 NOTE — Progress Notes (Signed)
   11/25/22 2210  Psych Admission Type (Psych Patients Only)  Admission Status Involuntary  Psychosocial Assessment  Patient Complaints Anxiety;Depression  Eye Contact Avoids  Facial Expression Flat  Affect Depressed  Speech Logical/coherent  Interaction Isolative;Minimal  Motor Activity Slow  Appearance/Hygiene Disheveled  Behavior Characteristics Cooperative  Mood Depressed;Anxious  Thought Process  Coherency WDL  Content WDL  Delusions None reported or observed  Perception WDL  Hallucination None reported or observed  Judgment Impaired  Confusion None  Danger to Self  Current suicidal ideation? Denies  Self-Injurious Behavior No self-injurious ideation or behavior indicators observed or expressed   Agreement Not to Harm Self Yes  Description of Agreement verbal  Danger to Others  Danger to Others None reported or observed

## 2022-11-25 NOTE — Progress Notes (Signed)
   11/24/22 2020  Psych Admission Type (Psych Patients Only)  Admission Status Involuntary  Psychosocial Assessment  Patient Complaints Depression  Eye Contact Brief  Facial Expression Flat  Affect Depressed  Speech Logical/coherent  Interaction Isolative;Minimal  Motor Activity Slow  Appearance/Hygiene Disheveled  Behavior Characteristics Cooperative  Mood Depressed;Pleasant  Thought Process  Coherency WDL  Content WDL  Delusions None reported or observed  Perception WDL  Hallucination None reported or observed  Judgment Impaired  Confusion None  Danger to Self  Current suicidal ideation? Denies  Self-Injurious Behavior No self-injurious ideation or behavior indicators observed or expressed   Agreement Not to Harm Self Yes  Description of Agreement verbal  Danger to Others  Danger to Others None reported or observed   Pt was offered support and encouragement. Q 15 minute checks were done for safety. Pt did not attend group. Isolative in bedroom. Pt has no complaints. Pt receptive to treatment and safety maintained on unit.

## 2022-11-25 NOTE — [facility] Group Notes (Signed)
Adult Psychoeducational Group Note  Date:  11/25/2022 Time:  8:56 PM  Group Topic/Focus:  Wrap-Up Group:   The focus of this group is to help patients review their daily goal of treatment and discuss progress on daily workbooks.  Participation Level:  Did Not Attend  Participation Quality:    Affect:    Cognitive:    Insight: None  Engagement in Group:    Modes of Intervention:    Additional Comments:  Pt did not attend group  Alisha Webster 11/25/2022, 8:56 PM

## 2022-11-25 NOTE — Group Note (Signed)
Recreation Therapy Group Note   Group Topic:Other  Group Date: 11/25/2022 Start Time: 1400 End Time: 1440 Facilitators: Lorely Bubb-McCall, LRT,CTRS Location: [address] Dayroom   Activity Description/Intervention: Therapeutic Drumming. Patients with peers and staff were given the opportunity to engage in a leader facilitated [facility] Empowerment Drumming Circle with staff from the [employer], in partnership with [employer]. Teaching laboratory technician and trained [employer], Theodoro Doing leading with LRT observing and documenting intervention and pt response. This evidenced-based practice targets 7 areas of health and wellbeing in the human experience including: stress-reduction, exercise, self-expression, camaraderie/support, nurturing, spirituality, and music-making (leisure).   Goal Area(s) Addresses:  Patient will engage in pro-social way in music group.  Patient will follow directions of drum leader on the first prompt. Patient will demonstrate no behavioral issues during group.  Patient will identify if a reduction in stress level occurs as a result of participation in therapeutic drum circle.    Education: Leisure exposure, Pharmacologist, Musical expression, Discharge Planning   Affect/Mood: N/A   Participation Level: Did not attend    Clinical Observations/Individualized Feedback:     Plan: Continue to engage patient in RT group sessions 2-3x/week.   Satrina Magallanes-McCall, LRT,CTRS 11/25/2022 3:51 PM

## 2022-11-25 NOTE — BH IP Treatment Plan (Signed)
Interdisciplinary Treatment and Diagnostic Plan Initial  11/25/2022 Time of Session: 1145 Alisha Webster MRN: [id number]  Principal Diagnosis: MDD (major depressive disorder), recurrent severe, without psychosis (HCC)  Secondary Diagnoses: Principal Problem:   MDD (major depressive disorder), recurrent severe, without psychosis (HCC) Active Problems:   Anxiety disorder, unspecified   Current Medications:  Current Facility-Administered Medications  Medication Dose Route Frequency Provider Last Rate Last Admin   acetaminophen (TYLENOL) tablet 650 mg  650 mg Oral Q6H PRN Motley-Mangrum, Jadeka A, PMHNP   650 mg at 11/24/22 1246   alum & mag hydroxide-simeth (MAALOX/MYLANTA) 200-200-20 MG/5ML suspension 30 mL  30 mL Oral Q4H PRN Motley-Mangrum, Jadeka A, PMHNP       ARIPiprazole (ABILIFY) tablet 5 mg  5 mg Oral Daily Motley-Mangrum, Jadeka A, PMHNP   5 mg at 11/25/22 0820   diphenhydrAMINE (BENADRYL) capsule 50 mg  50 mg Oral TID PRN Motley-Mangrum, Jadeka A, PMHNP       Or   diphenhydrAMINE (BENADRYL) injection 50 mg  50 mg Intramuscular TID PRN Motley-Mangrum, Jadeka A, PMHNP       haloperidol (HALDOL) tablet 5 mg  5 mg Oral TID PRN Motley-Mangrum, Jadeka A, PMHNP       Or   haloperidol lactate (HALDOL) injection 5 mg  5 mg Intramuscular TID PRN Motley-Mangrum, Jadeka A, PMHNP       hydrochlorothiazide (HYDRODIURIL) tablet 25 mg  25 mg Oral Daily Motley-Mangrum, Jadeka A, PMHNP   25 mg at 11/25/22 5784   hydrOXYzine (ATARAX) tablet 25 mg  25 mg Oral TID PRN Motley-Mangrum, Jadeka A, PMHNP       ibuprofen (ADVIL) tablet 600 mg  600 mg Oral Q6H PRN Rex Kras, MD   600 mg at 11/25/22 0833   levETIRAcetam (KEPPRA) tablet 1,000 mg  1,000 mg Oral BID Motley-Mangrum, Jadeka A, PMHNP   1,000 mg at 11/25/22 0820   levothyroxine (SYNTHROID) tablet 88 mcg  88 mcg Oral q AM Motley-Mangrum, Jadeka A, PMHNP   88 mcg at 11/25/22 6962   loperamide (IMODIUM) capsule 2 mg  2 mg Oral PRN Rex Kras, MD   2 mg at 11/25/22 0833   LORazepam (ATIVAN) tablet 2 mg  2 mg Oral TID PRN Motley-Mangrum, Geralynn Ochs A, PMHNP       Or   LORazepam (ATIVAN) injection 2 mg  2 mg Intramuscular TID PRN Motley-Mangrum, Jadeka A, PMHNP       magnesium hydroxide (MILK OF MAGNESIA) suspension 30 mL  30 mL Oral Daily PRN Motley-Mangrum, Jadeka A, PMHNP       ondansetron (ZOFRAN-ODT) disintegrating tablet 4 mg  4 mg Oral Q8H PRN Rex Kras, MD   4 mg at 11/25/22 1212   pantoprazole (PROTONIX) EC tablet 40 mg  40 mg Oral Daily Motley-Mangrum, Jadeka A, PMHNP   40 mg at 11/25/22 0820   [START ON 11/26/2022] potassium chloride (KLOR-CON) packet 40 mEq  40 mEq Oral BID Rex Kras, MD       traZODone (DESYREL) tablet 50 mg  50 mg Oral QHS PRN Motley-Mangrum, Jadeka A, PMHNP       venlafaxine XR (EFFEXOR-XR) 24 hr capsule 150 mg  150 mg Oral q AM Motley-Mangrum, Jadeka A, PMHNP   150 mg at 11/25/22 0945   PTA Medications: Medications Prior to Admission  Medication Sig Dispense Refill Last Dose   amLODipine (NORVASC) 10 MG tablet Take 10 mg by mouth daily.      ARIPiprazole (ABILIFY) 5 MG tablet Take 5 mg by  mouth daily.      busPIRone (BUSPAR) 10 MG tablet Take 10 mg by mouth 2 (two) times daily.      gabapentin (NEURONTIN) 100 MG capsule Take 200 mg by mouth 3 (three) times daily.      hydrochlorothiazide (HYDRODIURIL) 25 MG tablet Take 25 mg by mouth daily.      levETIRAcetam (KEPPRA) 1000 MG tablet Take 1,000 mg by mouth 2 (two) times daily.      levothyroxine (SYNTHROID) 88 MCG tablet Take 88 mcg by mouth in the morning.      lisinopril (ZESTRIL) 10 MG tablet Take 10 mg by mouth daily.      omeprazole (PRILOSEC) 20 MG capsule Take 20 mg by mouth daily.      Oxycodone HCl 10 MG TABS Take 10 mg by mouth every 6 (six) hours as needed (pain).      potassium chloride SA (KLOR-CON M) 20 MEQ tablet Take 20 mEq by mouth 2 (two) times daily.      venlafaxine XR (EFFEXOR-XR) 75 MG 24 hr capsule Take 150  mg by mouth in the morning.      VITAMIN D PO Take 1 tablet by mouth daily.       Patient Stressors: Health problems   Substance abuse   Traumatic event    Patient Strengths: Communication skills   Treatment Modalities: Medication Management, Group therapy, Case management,  1 to 1 session with clinician, Psychoeducation, Recreational therapy.   Physician Treatment Plan for Primary Diagnosis: MDD (major depressive disorder), recurrent severe, without psychosis (HCC) Long Term Goal(s): Improvement in symptoms so as ready for discharge   Short Term Goals: Ability to identify changes in lifestyle to reduce recurrence of condition will improve Ability to verbalize feelings will improve Ability to disclose and discuss suicidal ideas Ability to demonstrate self-control will improve Ability to identify and develop effective coping behaviors will improve  Medication Management: Evaluate patient's response, side effects, and tolerance of medication regimen.  Therapeutic Interventions: 1 to 1 sessions, Unit Group sessions and Medication administration.  Evaluation of Outcomes: Progressing  Physician Treatment Plan for Secondary Diagnosis: Principal Problem:   MDD (major depressive disorder), recurrent severe, without psychosis (HCC) Active Problems:   Anxiety disorder, unspecified  Long Term Goal(s): Improvement in symptoms so as ready for discharge   Short Term Goals: Ability to identify changes in lifestyle to reduce recurrence of condition will improve Ability to verbalize feelings will improve Ability to disclose and discuss suicidal ideas Ability to demonstrate self-control will improve Ability to identify and develop effective coping behaviors will improve     Medication Management: Evaluate patient's response, side effects, and tolerance of medication regimen.  Therapeutic Interventions: 1 to 1 sessions, Unit Group sessions and Medication administration.  Evaluation of  Outcomes: Progressing   RN Treatment Plan for Primary Diagnosis: MDD (major depressive disorder), recurrent severe, without psychosis (HCC) Long Term Goal(s): Knowledge of disease and therapeutic regimen to maintain health will improve  Short Term Goals: Ability to remain free from injury will improve, Ability to verbalize frustration and anger appropriately will improve, Ability to demonstrate self-control, Ability to participate in decision making will improve, Ability to verbalize feelings will improve, Ability to disclose and discuss suicidal ideas, Ability to identify and develop effective coping behaviors will improve, and Compliance with prescribed medications will improve  Medication Management: RN will administer medications as ordered by provider, will assess and evaluate patient's response and provide education to patient for prescribed medication. RN will report any  adverse and/or side effects to prescribing provider.  Therapeutic Interventions: 1 on 1 counseling sessions, Psychoeducation, Medication administration, Evaluate responses to treatment, Monitor vital signs and CBGs as ordered, Perform/monitor CIWA, COWS, AIMS and Fall Risk screenings as ordered, Perform wound care treatments as ordered.  Evaluation of Outcomes: Progressing   LCSW Treatment Plan for Primary Diagnosis: MDD (major depressive disorder), recurrent severe, without psychosis (HCC) Long Term Goal(s): Safe transition to appropriate next level of care at discharge, Engage patient in therapeutic group addressing interpersonal concerns.  Short Term Goals: Engage patient in aftercare planning with referrals and resources, Increase social support, Increase ability to appropriately verbalize feelings, Increase emotional regulation, Facilitate acceptance of mental health diagnosis and concerns, Facilitate patient progression through stages of change regarding substance use diagnoses and concerns, Identify triggers  associated with mental health/substance abuse issues, and Increase skills for wellness and recovery  Therapeutic Interventions: Assess for all discharge needs, 1 to 1 time with Social worker, Explore available resources and support systems, Assess for adequacy in community support network, Educate family and significant other(s) on suicide prevention, Complete Psychosocial Assessment, Interpersonal group therapy.  Evaluation of Outcomes: Progressing   Progress in Treatment: Attending groups: Yes. Participating in groups: Yes. Taking medication as prescribed: Yes. Toleration medication: Yes. Family/Significant other contact made: No, will contact:  Pt declined consents Patient understands diagnosis: Yes. Discussing patient identified problems/goals with staff: Yes. Medical problems stabilized or resolved: Yes. Denies suicidal/homicidal ideation: Yes. Issues/concerns per patient self-inventory: Yes. Other: N/A  New problem(s) identified: No, Describe:  None reported  New Short Term/Long Term Goal(s): medication stabilization, elimination of SI thoughts, development of comprehensive mental wellness plan.   Patient Goals:  Medication Stabilization  Discharge Plan or Barriers: Patient recently admitted. CSW will continue to follow and assess for appropriate referrals and possible discharge planning  Reason for Continuation of Hospitalization: Anxiety Depression Medication stabilization  Estimated Length of Stay: 5-7 Days  Last 3 [place] Suicide Severity Risk Score: Flowsheet Row [facility] (Current) [facility] from 11/22/2022 in [facility] ED to Hosp-Admission (Discharged) from 11/18/2022 in [facility]G [facility]  C-SSRS RISK CATEGORY No Risk High Risk No Risk       Last PHQ 2/9 Scores:     No data to display        detox, medication management for mood  stabilization; elimination of SI thoughts; development of comprehensive mental wellness/sobriety plan     Scribe for Treatment Team: Ane Payment, LCSW 11/25/2022 2:50 PM

## 2022-11-25 NOTE — Progress Notes (Signed)
COVID came back negative.

## 2022-11-26 MED ORDER — GABAPENTIN 100 MG PO CAPS
200.0000 mg | ORAL_CAPSULE | Freq: Three times a day (TID) | ORAL | Status: DC
  Administered 2022-11-26 – 2022-12-02 (×12): 200 mg via ORAL
  Filled 2022-11-26 (×24): qty 2

## 2022-11-26 MED ORDER — BUSPIRONE HCL 10 MG PO TABS
10.0000 mg | ORAL_TABLET | Freq: Two times a day (BID) | ORAL | Status: DC
  Administered 2022-11-26 – 2022-12-02 (×8): 10 mg via ORAL
  Filled 2022-11-26 (×10): qty 1
  Filled 2022-11-26: qty 2
  Filled 2022-11-26 (×5): qty 1

## 2022-11-26 NOTE — Progress Notes (Signed)
Patient refused labs this morning.

## 2022-11-26 NOTE — Group Note (Signed)
LCSW Group Therapy Note   Group Date: 11/26/2022 Start Time: 1100 End Time: 1200  Type of Therapy and Topic: Group Therapy: Relationship Check-Ins   Participation Level: Did Not Attend   Description Group:  In this group patients are encouraged to rate how well or not well they are able to improve their relationships in Beliefs and Values, Communication, Family and Friends, Actuary and Household, and Intimacy. Patients will be able to discuss and identify what is going well within these aspects and what is not. Patients will be able to find appropriate ways, solutions, and skills that will help them within the selected relationships category. Patients will be encouraged to share and reflect on why things within their relationships are not going so well and get feedback from the instructor or their peers.  This group will be solution focused and process-oriented with patients' participation in sharing and listening to their own and peers experience; along with receive support and advice on how to improve or change the circumstance that is known to be challenging in their relationships category (Beliefs and Values, Communication, Family and Friends, Actuary and Household, and Intimacy).   Therapeutic Goals:  Patient will identify their strengths and weakness within their Beliefs and Values, Communication, Family and Friends, Actuary and Household, and Intimacy relationships. Patient will identify reasons why and how they can improve their relationships.  Patient will identify how they can be supportive and honest to themselves and in their relationships.  Patient will be able to gain support and give support to others with similar challenges.   Summary of Patient Progress  Pt did not attend group.   Therapeutic Modalities:  Solution Focused Therapy Cognitive Behavioral Therapy  Psychodynamic Therapy  Dialectical Behavior Therapy   Rafik Gilley , LCSWA  Izell Peterson,  LCSW 11/26/2022  12:26 PM

## 2022-11-26 NOTE — Group Note (Signed)
Date:  11/26/2022 Time:  9:00 PM  Group Topic/Focus:  Wrap-Up Group:   The focus of this group is to help patients review their daily goal of treatment and discuss progress on daily workbooks.    Participation Level:  Did Not Attend  Participation Quality:   n/a  Affect:   n/a  Cognitive:   n/a  Insight: None  Engagement in Group:  None  Modes of Intervention:   n/a  Additional Comments:  Patient did not attend group  Kennieth Francois 11/26/2022, 9:00 PM

## 2022-11-26 NOTE — Progress Notes (Signed)
D: Patient is alert and oriented. Patient is isolative, she just wants to be in bed. Denies SI, HI, AVH, and verbally contracts for safety.    A: Scheduled medications administered per MD order. Medications had to be taken to patients room. PRN imodium and zofran administered (please see other progress notes). Support provided. Patient educated on safety on the unit and medications. Routine safety checks every 15 minutes. Patient stated understanding to tell nurse about any new physical symptoms. Patient understands to tell staff of any needs.     R: No adverse drug reactions noted. Patient remains safe at this time and will continue to monitor.    11/26/22 1000  Psych Admission Type (Psych Patients Only)  Admission Status Involuntary  Psychosocial Assessment  Patient Complaints Anxiety;Depression  Eye Contact Brief  Facial Expression Flat  Affect Depressed  Speech Logical/coherent  Interaction Isolative  Motor Activity Slow  Appearance/Hygiene Disheveled  Behavior Characteristics Unwilling to participate  Mood Depressed;Anxious  Thought Process  Coherency WDL  Content WDL  Delusions None reported or observed  Perception WDL  Hallucination None reported or observed  Judgment Impaired  Confusion None  Danger to Self  Current suicidal ideation? Denies  Self-Injurious Behavior No self-injurious ideation or behavior indicators observed or expressed   Agreement Not to Harm Self Yes  Description of Agreement verbal  Danger to Others  Danger to Others None reported or observed

## 2022-11-26 NOTE — [facility] Group Notes (Signed)
Spiritual care group facilitated by Chaplain Katy Claussen, [facility]  Group focused on topic of strength. Group members reflected on what thoughts and feelings emerge when they hear this topic. They then engaged in facilitated dialog around how strength is present in their lives. This dialog focused on representing what strength had been to them in their lives (images and patterns given) and what they saw as helpful in their life now (what they needed / wanted).  Activity drew on narrative framework.  Patient Progress: Did not attend.  

## 2022-11-26 NOTE — Plan of Care (Signed)
  Problem: Education: Goal: Knowledge of [place] [facility] Education information/materials will improve Outcome: Progressing Goal: Emotional status will improve Outcome: Progressing Goal: Mental status will improve Outcome: Progressing Goal: Verbalization of understanding the information provided will improve Outcome: Progressing   Problem: Activity: Goal: Interest or engagement in activities will improve Outcome: Not Progressing Goal: Sleeping patterns will improve Outcome: Progressing

## 2022-11-26 NOTE — Progress Notes (Signed)
PRN imodium and zofran given at 1023 for diarrhea and nausea. When reassessed for effectiveness patient reports the medications seem to be helping.
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
## 2022-11-27 NOTE — Progress Notes (Addendum)
Patient is alert and uncooperative. Patient refused to answer assessment questions this morning. Patient refused to take medications this morning. Patient refused to have her vital signs taken this morning. Patient is irritable. Patient did take 1700 medications that had to be brought to her room. Patient complains of knee pain and anxiety (please see previous progress notes for details).   Patient refused morning medications but 1700 scheduled medications administered per MD order. PRN ibuprofen and vistaril administered (please see previous progress notes). Support provided. Patient educated on safety on the unit and medications. Routine safety checks every 15 minutes. Patient instructed to tell staff of any needs.  No adverse drug reactions noted. Patient remains safe at this time and will continue to monitor.    11/27/22 1300  Psych Admission Type (Psych Patients Only)  Admission Status Involuntary  Psychosocial Assessment  Patient Complaints Anxiety;Depression  Eye Contact Fair  Facial Expression Flat  Affect Depressed  Speech Logical/coherent  Interaction Isolative  Motor Activity Slow  Appearance/Hygiene Disheveled  Behavior Characteristics Unwilling to participate;Irritable  Mood Depressed;Anxious;Irritable  Thought Process  Coherency WDL  Content WDL  Delusions None reported or observed  Perception WDL  Hallucination None reported or observed  Judgment Limited  Confusion None  Danger to Self  Current suicidal ideation?  (Patient refused to answer)  Self-Injurious Behavior No self-injurious ideation or behavior indicators observed or expressed   Agreement Not to Harm Self Yes  Description of Agreement verbal  Danger to Others  Danger to Others None reported or observed

## 2022-11-27 NOTE — Progress Notes (Signed)
PRN vistaril administered at 1541 for anxiety. Upon reassessment for effectiveness patient reports the vistaril was not effective.
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
## 2022-11-27 NOTE — Progress Notes (Signed)
Patient observed lying in bed resting. Writer spoke with her about her day and how she has been refusing medications. She inquired about her dinner and was informed it was at front desk. She requested that it be brought to her and she refused to get up and get her tray. Writer informed her that patients can no longer eat in their rooms and she reported " then I won't be eating then." Support given and safety maintained with 15 min checks.  11/27/22 2015  Psych Admission Type (Psych Patients Only)  Admission Status Involuntary  Psychosocial Assessment  Patient Complaints Anger  Eye Contact Poor  Facial Expression Flat  Affect Irritable  Speech Logical/coherent  Interaction Isolative  Motor Activity Other (Comment) (pt lying in bed currently, refused group)  Appearance/Hygiene Disheveled  Behavior Characteristics Unwilling to participate;Irritable  Mood Irritable  Thought Process  Coherency WDL  Content Blaming others  Delusions None reported or observed  Perception WDL  Hallucination None reported or observed  Judgment Poor  Confusion None  Danger to Self  Current suicidal ideation? Denies  Danger to Others  Danger to Others None reported or observed

## 2022-11-27 NOTE — Group Note (Signed)
Recreation Therapy Group Note   Group Topic:Team Building  Group Date: 11/27/2022 Start Time: 0930 End Time: 1000 Facilitators: Jelena Malicoat-McCall, LRT,CTRS Location: [address] Day[address]   Goal Area(s) Addresses:  Patient will effectively work with peer towards shared goal.  Patient will identify skills used to make activity successful.  Patient will identify how skills used during activity can be applied to reach post d/c goals.   Group Description: [employer]. In teams of 5-6, patients were given 11 craft pipe cleaners. Using the materials provided, patients were instructed to compete again the opposing team(s) to build the tallest free-standing structure from floor level. The activity was timed; difficulty increased by Clinical research associate as Production designer, theatre/television/film continued.  Systematically resources were removed with additional directions for example, placing one arm behind their back, working in silence, and shape stipulations. LRT facilitated post-activity discussion reviewing team processes and necessary communication skills involved in completion. Patients were encouraged to reflect how the skills utilized, or not utilized, in this activity can be incorporated to positively impact support systems post discharge.   Clinical Observations/Individualized Feedback: Group did not occur due to previous group going over into group time.    Plan: Continue to engage patient in RT group sessions 2-3x/week.   Mava Suares-McCall, LRT,CTRS 11/27/2022 12:04 PM

## 2022-11-27 NOTE — [facility] Group Notes (Signed)
Adult Psychoeducational Group Note  Date:  11/27/2022 Time:  8:54 PM  Group Topic/Focus:  Wrap-Up Group:   The focus of this group is to help patients review their daily goal of treatment and discuss progress on daily workbooks.  Participation Level:  Did Not Attend  Participation Quality:    Affect:    Cognitive:    Insight:   Engagement in Group:    Modes of Intervention:    Additional Comments:  Pt. Did not attend group Joselyn Arrow 11/27/2022, 8:54 PM

## 2022-11-27 NOTE — Progress Notes (Signed)
PRN ibuprofen administered at 1259 for bilateral knee pain rated 10/10. Upon reassessment for effectiveness patient rates pain 3/10.

## 2022-11-27 NOTE — Progress Notes (Signed)
Inpatient Diabetes Program Recommendations  AACE/ADA: New Consensus Statement on Inpatient Glycemic Control (2015)  Target Ranges:  Prepandial:   less than 140 mg/dL      Peak postprandial:   less than 180 mg/dL (1-2 hours)      Critically ill patients:  140 - 180 mg/dL   Lab Results  Component Value Date   GLUCAP 205 (H) 11/22/2022    Review of Glycemic Control  Latest Reference Range & Units 11/18/22 21:01 11/20/22 04:30 11/22/22 19:19 11/22/22 19:31  Glucose 70 - 99 mg/dL 161 (H) 94 096 (H) 045 (H)   Diabetes history: None  Inpatient Diabetes Program Recommendations:    Note elevated lab glucose, however this does not appear to be fasting.  A1C is pending.   No further recommendations at this time.  If A1C is elevated, may consider adding CBG checks at least BID and possibly start oral agent for DM.    Will sign off referral prior to the weekend.  Please re-consult as needed.   Thanks,  Beryl Meager, RN, BC-ADM Inpatient Diabetes Coordinator Pager [phone]  (8a-5p)

## 2022-11-27 NOTE — Plan of Care (Addendum)
  Problem: Education: Goal: Emotional status will improve Outcome: Progressing Goal: Mental status will improve Outcome: Progressing  Patient was pacing this evening very anxious and tearful about discharge. Requested for vistaril for anxiety at HS. Patient denied SI/HI/A/VH and verbally contracted for safety. Required lots of emotional support this shift. Q 15 minutes safety checks ongoing. Patient remains safe.   Patient refused labs this am.

## 2022-11-28 DIAGNOSIS — F332 Major depressive disorder, recurrent severe without psychotic features: Secondary | ICD-10-CM | POA: Diagnosis not present

## 2022-11-28 MED ORDER — OXYCODONE-ACETAMINOPHEN 5-325 MG PO TABS
1.0000 | ORAL_TABLET | ORAL | Status: DC | PRN
  Administered 2022-11-28 – 2022-12-01 (×7): 1 via ORAL
  Filled 2022-11-28 (×7): qty 1

## 2022-11-28 MED ORDER — PNEUMOCOCCAL 20-VAL CONJ VACC 0.5 ML IM SUSY
0.5000 mL | PREFILLED_SYRINGE | INTRAMUSCULAR | Status: DC
  Filled 2022-11-28: qty 0.5

## 2022-11-28 NOTE — Progress Notes (Signed)
   11/28/22 0800  Psych Admission Type (Psych Patients Only)  Admission Status Involuntary  Psychosocial Assessment  Patient Complaints Irritability;Anger  Eye Contact Brief  Facial Expression Angry  Affect Irritable  Speech Logical/coherent  Interaction Demanding;Isolative  Motor Activity Slow  Appearance/Hygiene Disheveled  Behavior Characteristics Unwilling to participate;Irritable  Mood Irritable  Thought Process  Coherency WDL  Content Blaming others  Delusions None reported or observed  Perception WDL  Hallucination None reported or observed  Judgment Poor  Confusion None  Danger to Self  Current suicidal ideation? Denies  Agreement Not to Harm Self Yes  Description of Agreement verbal  Danger to Others  Danger to Others None reported or observed

## 2022-11-28 NOTE — Progress Notes (Signed)
Patient refused afternoon vitals and 1700 medications. Patient resistant to being awoken.

## 2022-11-28 NOTE — Plan of Care (Signed)
  Problem: Education: Goal: Knowledge of [facility] Education information/materials will improve Outcome: Progressing Goal: Emotional status will improve Outcome: Progressing Goal: Mental status will improve Outcome: Progressing Goal: Verbalization of understanding the information provided will improve Outcome: Progressing   Problem: Activity: Goal: Interest or engagement in activities will improve Outcome: Progressing Goal: Sleeping patterns will improve Outcome: Progressing   Problem: Coping: Goal: Ability to verbalize frustrations and anger appropriately will improve Outcome: Progressing Goal: Ability to demonstrate self-control will improve Outcome: Progressing   Problem: Health Behavior/Discharge Planning: Goal: Identification of resources available to assist in meeting health care needs will improve Outcome: Progressing Goal: Compliance with treatment plan for underlying cause of condition will improve Outcome: Progressing

## 2022-11-28 NOTE — [facility] Group Notes (Signed)
[facility] Group Notes:  (Nursing/MHT/Case Management/Adjunct)  Date:  11/28/2022  Time:  8:48 PM  Type of Therapy:     Wrap Up Group  Participation Level:  Did Not Attend  Participation Quality:   n/a  Affect:   n/a  Cognitive:   n/a  Insight:  None  Engagement in Group:   did not attend  Modes of Intervention:   n/a  Summary of Progress/Problems:  Colbin Jovel E Denitra Donaghey 11/28/2022, 8:48 PM
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
## 2022-11-29 DIAGNOSIS — F332 Major depressive disorder, recurrent severe without psychotic features: Secondary | ICD-10-CM | POA: Diagnosis not present

## 2022-11-29 NOTE — Group Note (Signed)
LCSW Group Therapy Note   Group Date: 11/29/2022 Start Time: 1000 End Time: 1100  . Type of Therapy and Topic:  Group Therapy: Embracing Change   Participation Level:  Did Not Attend   Description of Group:   In this group, patients shared and discussed the importance change.  The group discussed how change plays a vial role in life and the decision that are made. A group discussion was facilitated in group to encourage participants to challenge negative thought with positives in their lives.  Therapeutic Goals: Patients will identify how the word "Change" resonates with them  Patients will discuss how they handle change Patients will explore negative and positive emotions related to change. Patients will verbalize what change look like for them/   Summary of Patient Progress:  The patient was invited but did not attend group  Therapeutic Modalities:   Solution-Focused Therapy Activity  Steffanie Dunn , LCSWA  11/29/2022  1:40 PM

## 2022-11-29 NOTE — Progress Notes (Signed)
Patient has been asleep since shift change. Writer has attempted 3 times to see if she is awake when entering her room. Patient is asleep with no distress noted and respirations even and unlabored.
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
## 2022-11-29 NOTE — Progress Notes (Signed)
[facility] Acute PT  Note  Patient Details Name: Alisha Webster MRN: [id number] DOB: 09/09/1970   Cancelled Treatment:    Reason Eval/Treat Not Completed: PT screened; PT would need input from pt's neurosurgeon regarding cervical precautions/rehab progression; therefore recommend pt f/u with OPPT at d/c for rehab regarding C-spine rehab/chronic pain and frozen shoulder.  We cannot follow pt on an out pt basis as we are an acute care rehab department. Thank you for this referral. Pt may follow with OT at [facility] and please feel free to contact with any questions. PT signing off at this time.   [facility] 11/29/2022, 9:19 AM

## 2022-11-29 NOTE — [facility] Group Notes (Signed)
Patient did not attend the goals group. 

## 2022-11-29 NOTE — [facility] Group Notes (Signed)
[facility] Group Notes:  (Nursing)  Date:  11/29/2022  Time:1345  Type of Therapy:  Psychoeducational Skills  Participation Level:  Did Not Attend

## 2022-11-29 NOTE — Plan of Care (Signed)
Nurse discussed anxiety and coping skills with patient. 

## 2022-11-29 NOTE — [facility] Group Notes (Signed)
Psychoeducational Group Note  Date:  11/29/2022 Time:  2000  Group Topic/Focus:  Wrap up group  Participation Level: Did Not Attend  Participation Quality:  Not Applicable  Affect:  Not Applicable  Cognitive:  Not Applicable  Insight:  Not Applicable  Engagement in Group: Not Applicable  Additional Comments:  Did not attend.   Marcille Buffy 11/29/2022, 9:46 PM

## 2022-11-29 NOTE — Progress Notes (Signed)
   11/29/22 0558  15 Minute Checks  Location Bedroom  Visual Appearance Calm  Behavior Sleeping  Sleep (Behavioral Health Patients Only)  Calculate sleep? (Click Yes once per 24 hr at 0600 safety check) Yes  Documented sleep last 24 hours 10

## 2022-11-29 NOTE — Progress Notes (Signed)
D:  Patient denied SI and HI, contracts for safety.  Denied  A/V hallucinations.  A:  Medications administered per MD orders.  Emotional support and encouragement given patient. R: Patient has been in bed most of the day sleeping.  Morning meds were given after patient woke up.  Patient has been very pleasant today.  Has not requested any prn meds today.

## 2022-11-30 ENCOUNTER — Encounter (HOSPITAL_COMMUNITY): Payer: Self-pay

## 2022-11-30 DIAGNOSIS — F332 Major depressive disorder, recurrent severe without psychotic features: Secondary | ICD-10-CM | POA: Diagnosis not present

## 2022-11-30 LAB — COMPREHENSIVE METABOLIC PANEL
ALT: 77 U/L — ABNORMAL HIGH (ref 0–44)
AST: 77 U/L — ABNORMAL HIGH (ref 15–41)
Albumin: 3.6 g/dL (ref 3.5–5.0)
Alkaline Phosphatase: 132 U/L — ABNORMAL HIGH (ref 38–126)
Anion gap: 8 (ref 5–15)
BUN: 21 mg/dL — ABNORMAL HIGH (ref 6–20)
CO2: 27 mmol/L (ref 22–32)
Calcium: 8.7 mg/dL — ABNORMAL LOW (ref 8.9–10.3)
Chloride: 103 mmol/L (ref 98–111)
Creatinine, Ser: 0.99 mg/dL (ref 0.44–1.00)
GFR, Estimated: >60 mL/min (ref >60)
Glucose, Bld: 119 mg/dL — ABNORMAL HIGH (ref 70–99)
Potassium: 3.7 mmol/L (ref 3.5–5.1)
Sodium: 138 mmol/L (ref 135–145)
Total Bilirubin: 0.2 mg/dL — ABNORMAL LOW (ref 0.3–1.2)
Total Protein: 6.5 g/dL (ref 6.5–8.1)

## 2022-11-30 NOTE — Progress Notes (Signed)
   11/30/22 2200  Psych Admission Type (Psych Patients Only)  Admission Status Involuntary  Psychosocial Assessment  Patient Complaints Anxiety;Depression  Eye Contact Fair  Facial Expression Anxious;Sad  Affect Appropriate to circumstance  Speech Logical/coherent  Interaction Assertive;Isolative  Motor Activity Slow  Appearance/Hygiene In hospital gown  Behavior Characteristics Cooperative;Calm  Mood Depressed;Pleasant  Thought Process  Coherency WDL  Content WDL  Delusions None reported or observed  Perception WDL  Hallucination None reported or observed  Judgment Limited  Confusion None  Danger to Self  Current suicidal ideation? Denies  Self-Injurious Behavior No self-injurious ideation or behavior indicators observed or expressed   Agreement Not to Harm Self Yes  Description of Agreement verbal contract  Danger to Others  Danger to Others None reported or observed

## 2022-11-30 NOTE — Progress Notes (Signed)
   11/30/22 0644  15 Minute Checks  Location Bedroom  Visual Appearance Calm  Behavior Sleeping  Sleep (Behavioral Health Patients Only)  Calculate sleep? (Click Yes once per 24 hr at 0600 safety check) Yes  Documented sleep last 24 hours 8.25

## 2022-11-30 NOTE — Plan of Care (Signed)
  Problem: Education: Goal: Knowledge of [facility] information/materials will improve Outcome: Progressing   Problem: Education: Goal: Verbalization of understanding the information provided will improve Outcome: Progressing   Problem: Activity: Goal: Interest or engagement in activities will improve Outcome: Progressing   Problem: Activity: Goal: Sleeping patterns will improve Outcome: Progressing   Problem: Health Behavior/Discharge Planning: Goal: Identification of resources available to assist in meeting health care needs will improve Outcome: Progressing   Problem: Safety: Goal: Periods of time without injury will increase Outcome: Progressing

## 2022-11-30 NOTE — Progress Notes (Addendum)
  Patient remains isolative to her room, in bed, and unwilling to participate in the unit group. During assessment, patient denies SI,HI, and AVH, patient rated pain to her neck 5/10, but declined pain medication and stated she will come and get it when she is ready. Patient remains safe in room, no distress noted. Routine safety checks ongoing.   11/29/22 2100  Psych Admission Type (Psych Patients Only)  Admission Status Involuntary  Psychosocial Assessment  Patient Complaints None  Eye Contact Avoids  Facial Expression Sad  Affect Anxious  Speech Other (Comment)  Interaction Isolative  Motor Activity Other (Comment)  Appearance/Hygiene Disheveled  Behavior Characteristics Unwilling to participate  Mood Depressed  Thought Process  Coherency WDL  Content WDL  Delusions None reported or observed  Perception WDL  Hallucination None reported or observed  Judgment Limited  Confusion None  Danger to Self  Current suicidal ideation? Denies  Agreement Not to Harm Self Yes  Description of Agreement Verbal  Danger to Others  Danger to Others None reported or observed
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
## 2022-11-30 NOTE — BH IP Treatment Plan (Signed)
Interdisciplinary Treatment and Diagnostic Plan Update  11/30/2022 Time of Session: 10:10am (UPDATE) Alisha Webster MRN: [id number]  Principal Diagnosis: MDD (major depressive disorder), recurrent severe, without psychosis (HCC)  Secondary Diagnoses: Principal Problem:   MDD (major depressive disorder), recurrent severe, without psychosis (HCC) Active Problems:   Anxiety disorder, unspecified   Current Medications:  Current Facility-Administered Medications  Medication Dose Route Frequency Provider Last Rate Last Admin   acetaminophen (TYLENOL) tablet 650 mg  650 mg Oral Q6H PRN Motley-Mangrum, Jadeka A, PMHNP   650 mg at 11/28/22 0826   alum & mag hydroxide-simeth (MAALOX/MYLANTA) 200-200-20 MG/5ML suspension 30 mL  30 mL Oral Q4H PRN Motley-Mangrum, Jadeka A, PMHNP       ARIPiprazole (ABILIFY) tablet 5 mg  5 mg Oral Daily Motley-Mangrum, Jadeka A, PMHNP   5 mg at 11/30/22 0804   busPIRone (BUSPAR) tablet 10 mg  10 mg Oral BID Rex Kras, MD   10 mg at 11/30/22 0804   diphenhydrAMINE (BENADRYL) capsule 50 mg  50 mg Oral TID PRN Motley-Mangrum, Geralynn Ochs A, PMHNP       Or   diphenhydrAMINE (BENADRYL) injection 50 mg  50 mg Intramuscular TID PRN Motley-Mangrum, Jadeka A, PMHNP       gabapentin (NEURONTIN) capsule 200 mg  200 mg Oral TID Rex Kras, MD   200 mg at 11/30/22 0804   haloperidol (HALDOL) tablet 5 mg  5 mg Oral TID PRN Motley-Mangrum, Jadeka A, PMHNP       Or   haloperidol lactate (HALDOL) injection 5 mg  5 mg Intramuscular TID PRN Motley-Mangrum, Jadeka A, PMHNP       hydrochlorothiazide (HYDRODIURIL) tablet 25 mg  25 mg Oral Daily Motley-Mangrum, Jadeka A, PMHNP   25 mg at 11/30/22 0805   hydrOXYzine (ATARAX) tablet 25 mg  25 mg Oral TID PRN Motley-Mangrum, Jadeka A, PMHNP   25 mg at 11/27/22 1541   ibuprofen (ADVIL) tablet 600 mg  600 mg Oral Q6H PRN Rex Kras, MD   600 mg at 11/27/22 1259   levETIRAcetam (KEPPRA) tablet 1,000 mg  1,000 mg Oral BID  Motley-Mangrum, Jadeka A, PMHNP   1,000 mg at 11/30/22 0805   levothyroxine (SYNTHROID) tablet 88 mcg  88 mcg Oral q AM Motley-Mangrum, Jadeka A, PMHNP   88 mcg at 11/30/22 0514   loperamide (IMODIUM) capsule 2 mg  2 mg Oral PRN Rex Kras, MD   2 mg at 11/28/22 2952   LORazepam (ATIVAN) tablet 2 mg  2 mg Oral TID PRN Motley-Mangrum, Geralynn Ochs A, PMHNP       Or   LORazepam (ATIVAN) injection 2 mg  2 mg Intramuscular TID PRN Motley-Mangrum, Jadeka A, PMHNP       magnesium hydroxide (MILK OF MAGNESIA) suspension 30 mL  30 mL Oral Daily PRN Motley-Mangrum, Jadeka A, PMHNP       oxyCODONE-acetaminophen (PERCOCET/ROXICET) 5-325 MG per tablet 1 tablet  1 tablet Oral Q4H PRN Rex Kras, MD   1 tablet at 11/30/22 0514   pantoprazole (PROTONIX) EC tablet 40 mg  40 mg Oral Daily Motley-Mangrum, Jadeka A, PMHNP   40 mg at 11/30/22 0805   pneumococcal 20-valent conjugate vaccine (PREVNAR 20) injection 0.5 mL  0.5 mL Intramuscular Tomorrow-1000 Nkwenti, Doris, NP       traZODone (DESYREL) tablet 50 mg  50 mg Oral QHS PRN Motley-Mangrum, Jadeka A, PMHNP       venlafaxine XR (EFFEXOR-XR) 24 hr capsule 150 mg  150 mg Oral q AM Motley-Mangrum, Jadeka A,  PMHNP   150 mg at 11/29/22 1042   PTA Medications: Medications Prior to Admission  Medication Sig Dispense Refill Last Dose   amLODipine (NORVASC) 10 MG tablet Take 10 mg by mouth daily.      ARIPiprazole (ABILIFY) 5 MG tablet Take 5 mg by mouth daily.      busPIRone (BUSPAR) 10 MG tablet Take 10 mg by mouth 2 (two) times daily.      gabapentin (NEURONTIN) 100 MG capsule Take 200 mg by mouth 3 (three) times daily.      hydrochlorothiazide (HYDRODIURIL) 25 MG tablet Take 25 mg by mouth daily.      levETIRAcetam (KEPPRA) 1000 MG tablet Take 1,000 mg by mouth 2 (two) times daily.      levothyroxine (SYNTHROID) 88 MCG tablet Take 88 mcg by mouth in the morning.      lisinopril (ZESTRIL) 10 MG tablet Take 10 mg by mouth daily.      omeprazole (PRILOSEC) 20  MG capsule Take 20 mg by mouth daily.      Oxycodone HCl 10 MG TABS Take 10 mg by mouth every 6 (six) hours as needed (pain).      potassium chloride SA (KLOR-CON M) 20 MEQ tablet Take 20 mEq by mouth 2 (two) times daily.      venlafaxine XR (EFFEXOR-XR) 75 MG 24 hr capsule Take 150 mg by mouth in the morning.      VITAMIN D PO Take 1 tablet by mouth daily.       Patient Stressors: Health problems   Substance abuse   Traumatic event    Patient Strengths: Communication skills   Treatment Modalities: Medication Management, Group therapy, Case management,  1 to 1 session with clinician, Psychoeducation, Recreational therapy.   Physician Treatment Plan for Primary Diagnosis: MDD (major depressive disorder), recurrent severe, without psychosis (HCC) Long Term Goal(s): Improvement in symptoms so as ready for discharge   Short Term Goals: Ability to identify changes in lifestyle to reduce recurrence of condition will improve Ability to verbalize feelings will improve Ability to disclose and discuss suicidal ideas Ability to demonstrate self-control will improve Ability to identify and develop effective coping behaviors will improve  Medication Management: Evaluate patient's response, side effects, and tolerance of medication regimen.  Therapeutic Interventions: 1 to 1 sessions, Unit Group sessions and Medication administration.  Evaluation of Outcomes: Progressing  Physician Treatment Plan for Secondary Diagnosis: Principal Problem:   MDD (major depressive disorder), recurrent severe, without psychosis (HCC) Active Problems:   Anxiety disorder, unspecified  Long Term Goal(s): Improvement in symptoms so as ready for discharge   Short Term Goals: Ability to identify changes in lifestyle to reduce recurrence of condition will improve Ability to verbalize feelings will improve Ability to disclose and discuss suicidal ideas Ability to demonstrate self-control will improve Ability to  identify and develop effective coping behaviors will improve     Medication Management: Evaluate patient's response, side effects, and tolerance of medication regimen.  Therapeutic Interventions: 1 to 1 sessions, Unit Group sessions and Medication administration.  Evaluation of Outcomes: Progressing   RN Treatment Plan for Primary Diagnosis: MDD (major depressive disorder), recurrent severe, without psychosis (HCC) Long Term Goal(s): Knowledge of disease and therapeutic regimen to maintain health will improve  Short Term Goals: Ability to remain free from injury will improve, Ability to verbalize frustration and anger appropriately will improve, Ability to participate in decision making will improve, Ability to verbalize feelings will improve, Ability to identify and develop effective coping  behaviors will improve, and Compliance with prescribed medications will improve  Medication Management: RN will administer medications as ordered by provider, will assess and evaluate patient's response and provide education to patient for prescribed medication. RN will report any adverse and/or side effects to prescribing provider.  Therapeutic Interventions: 1 on 1 counseling sessions, Psychoeducation, Medication administration, Evaluate responses to treatment, Monitor vital signs and CBGs as ordered, Perform/monitor CIWA, COWS, AIMS and Fall Risk screenings as ordered, Perform wound care treatments as ordered.  Evaluation of Outcomes: Progressing   LCSW Treatment Plan for Primary Diagnosis: MDD (major depressive disorder), recurrent severe, without psychosis (HCC) Long Term Goal(s): Safe transition to appropriate next level of care at discharge, Engage patient in therapeutic group addressing interpersonal concerns.  Short Term Goals: Engage patient in aftercare planning with referrals and resources, Increase social support, Increase emotional regulation, Facilitate acceptance of mental health diagnosis  and concerns, Identify triggers associated with mental health/substance abuse issues, and Increase skills for wellness and recovery  Therapeutic Interventions: Assess for all discharge needs, 1 to 1 time with Social worker, Explore available resources and support systems, Assess for adequacy in community support network, Educate family and significant other(s) on suicide prevention, Complete Psychosocial Assessment, Interpersonal group therapy.  Evaluation of Outcomes: Progressing   Progress in Treatment: Attending groups: Yes. Participating in groups: Yes. Taking medication as prescribed: Yes. Toleration medication: Yes. Family/Significant other contact made: No, will contact:  Pt declined consents Patient understands diagnosis: Yes. Discussing patient identified problems/goals with staff: Yes. Medical problems stabilized or resolved: Yes. Denies suicidal/homicidal ideation: Yes. Issues/concerns per patient self-inventory: Yes. Other: N/A   New problem(s) identified: No, Describe:  None reported   New Short Term/Long Term Goal(s): medication stabilization, elimination of SI thoughts, development of comprehensive mental wellness plan.    Patient Goals:  Medication Stabilization   Discharge Plan or Barriers: Patient recently admitted. CSW will continue to follow and assess for appropriate referrals and possible discharge planning   Reason for Continuation of Hospitalization: Anxiety Depression Medication stabilization   Estimated Length of Stay: 4-5 Days  Last 3 [place] Suicide Severity Risk Score: Flowsheet Row Ad[facility] (Current) [facility] from 11/22/2022 in [facility] at [facility] ED to Hosp-Admission (Discharged) from 11/18/2022 in [facility] GENERAL MED[facility]SURG UNIT  C-SSRS RISK CATEGORY No Risk High Risk No Risk       Last PHQ 2/9 Scores:     No data to display           Scribe for Treatment Team: Izell [place], LCSW 11/30/2022 10:21 AM

## 2022-11-30 NOTE — Group Note (Signed)
Recreation Therapy Group Note   Group Topic:Problem Solving  Group Date: 11/30/2022 Start Time: 0930 End Time: 1000 Facilitators: Zabrina Brotherton-McCall, LRT,CTRS Location: [address] Dayroom   Goal Area(s) Addresses:  Patient will effectively work with peer towards shared goal.  Patient will identify skills used to make activity successful.  Patient will identify how skills used during activity can be applied to reach post d/c goals.   Group Description: [employer]. In teams of 5-6, patients were given 11 craft pipe cleaners. Using the materials provided, patients were instructed to compete again the opposing team(s) to build the tallest free-standing structure from floor level. The activity was timed; difficulty increased by Clinical research associate as Production designer, theatre/television/film continued.  Systematically resources were removed with additional directions for example, placing one arm behind their back, working in silence, and shape stipulations. LRT facilitated post-activity discussion reviewing team processes and necessary communication skills involved in completion. Patients were encouraged to reflect how the skills utilized, or not utilized, in this activity can be incorporated to positively impact support systems post discharge.   Affect/Mood: N/A   Participation Level: Did not attend    Clinical Observations/Individualized Feedback:    Plan: Continue to engage patient in RT group sessions 2-3x/week.   Lovell Nuttall-McCall, LRT,CTRS  11/30/2022 12:46 PM

## 2022-11-30 NOTE — Plan of Care (Signed)
  Problem: Activity: Goal: Sleeping patterns will improve Outcome: Progressing   Problem: Coping: Goal: Ability to verbalize frustrations and anger appropriately will improve Outcome: Progressing   Problem: Coping: Goal: Ability to demonstrate self-control will improve Outcome: Progressing   Problem: Safety: Goal: Periods of time without injury will increase Outcome: Progressing

## 2022-11-30 NOTE — Progress Notes (Signed)
   11/30/22 0915  Psych Admission Type (Psych Patients Only)  Admission Status Involuntary  Psychosocial Assessment  Eye Contact Fair  Facial Expression Anxious  Affect Appropriate to circumstance  Speech Logical/coherent  Interaction Assertive  Motor Activity Slow  Appearance/Hygiene Unremarkable  Behavior Characteristics Cooperative;Calm  Mood Pleasant  Thought Process  Coherency WDL  Content WDL  Delusions None reported or observed  Perception WDL  Hallucination None reported or observed  Judgment Limited  Confusion None  Danger to Self  Current suicidal ideation? Denies  Self-Injurious Behavior No self-injurious ideation or behavior indicators observed or expressed   Agreement Not to Harm Self Yes  Description of Agreement verbally contracts for safety  Danger to Others  Danger to Others None reported or observed

## 2022-11-30 NOTE — Progress Notes (Signed)
Patient rated pain to her neck 10/10. Patient received Percocet 5/325 mg per MAR.

## 2022-11-30 NOTE — [facility] Group Notes (Signed)
[facility] Group Notes:  (Nursing/MHT/Case Management/Adjunct)  Date:  11/30/2022  Time:  8:18 PM  Type of Therapy:   AA group  Participation Level:  Did Not Attend  Participation Quality:    Affect:    Cognitive:    Insight:    Engagement in Group:    Modes of Intervention:    Summary of Progress/Problems:  Alisha Webster 11/30/2022, 8:18 PM

## 2022-12-01 DIAGNOSIS — F332 Major depressive disorder, recurrent severe without psychotic features: Secondary | ICD-10-CM | POA: Diagnosis not present

## 2022-12-01 MED ORDER — OXYCODONE-ACETAMINOPHEN 5-325 MG PO TABS
1.0000 | ORAL_TABLET | ORAL | Status: DC | PRN
  Administered 2022-12-02: 1 via ORAL
  Filled 2022-12-01: qty 1

## 2022-12-01 NOTE — Progress Notes (Addendum)
Upon evaluation today patient continues to present staff splitting refusing to cooperate with attending groups, does not get out of her room except to get her pain medication otherwise refusing other medications seems to fluctuate between wanting to discharge and refusing to get discharged and wanting to stay, when asked if the patient why she was admitted to the hospital in the first place she responds "I am homeless I need a place to stay" she does confirm to me that last time she had suicidal ideation was a week prior to this admission then when confronted with what was in the admission notes she vaguely reports some suicidal ideation on day of admission but unable to specify.  Secondary to patient's agitation and aggression with staff and refusing to leave the room she was given Haldol and Benadryl IM for agitation protocol.  Patient does not demonstrate any self injures behavior and apparently she over presented symptoms of suicidal ideation to being admission to the hospital to gain shelter inside the hospital secondary to being homeless.  Per staff social worker who spoke with the patient earlier during this hospitalization patient told her "I do not want to go to the shelter if I get discharged to the streets I will say I am suicidal so I can come back" I would continue to observe after Haldol and Benadryl were given for response and side effects and consider discharging later today or tomorrow morning.  In my opinion patient's borderline personality traits is causing significant disruption in the milieu with continuing stay especially with her confirmed that she came to the hospital only to gain shelter secondary to being homeless.  At 2:15 PM patient is asleep probably sec to effect of haldol and benadryl given earlier for agitation, she didn't go back or request percocet for pain and seems asleep very comfortably, unable to assess for dc at this time, will assess tomorrow morning for dc.

## 2022-12-01 NOTE — Plan of Care (Signed)

## 2022-12-01 NOTE — BHH Counselor (Signed)
  12/01/2022  1:07 PM   Alisha Webster  Type of note: progress note update   CSW spoke with Pt on 11/25/2022 about discharge plans and housing. Pt stated that she refused to go back to a shelter and refused to provide consent to call anyone. When offered the [facility] again Pt stated "If discharged to the shelter or streets again I will threaten SI again to come back because I refuse to be homeless."  Signed:  Marya Landry MSW, [facility] 12/01/2022  1:07 PM

## 2022-12-01 NOTE — Plan of Care (Signed)
  Problem: Education: Goal: Knowledge of [place] [facility] Education information/materials will improve Outcome: Progressing   Problem: Education: Goal: Emotional status will improve Outcome: Progressing   Problem: Education: Goal: Mental status will improve Outcome: Progressing   Problem: Activity: Goal: Sleeping patterns will improve Outcome: Progressing   Problem: Health Behavior/Discharge Planning: Goal: Compliance with treatment plan for underlying cause of condition will improve Outcome: Progressing   Problem: Safety: Goal: Periods of time without injury will increase Outcome: Progressing

## 2022-12-01 NOTE — [facility] Group Notes (Signed)
[facility] Group Notes:  (Nursing/MHT/Case Management/Adjunct)  Date:  12/01/2022  Time:  8:43 PM  Type of Therapy:   Wrap-up group  Participation Level:  Did Not Attend  Participation Quality:    Affect:    Cognitive:    Insight:    Engagement in Group:    Modes of Intervention:    Summary of Progress/Problems: Pt refused group.   Noah Delaine 12/01/2022, 8:43 PM

## 2022-12-01 NOTE — Progress Notes (Signed)
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 case management to assist with discharge planning and identification of hospital follow-up needs prior to discharge Estimated LOS: 5-7 days Discharge Concerns: Need to establish a safety plan; Medication compliance and effectiveness Discharge Goals: Return home with outpatient referrals for mental health follow-up including medication management/psychotherapy.  Contract for safety, establish safety plan prior to discharge  Establish outpatient orthopedic surgery appt for further assessment and management of acute on chronic pain with suspicion for denervation syndrome.      Disposition: Patient requires continued inpatient level of psychiatric care for further monitoring, safety assessment/planning, medication management, and discharge planning.   I certify that inpatient services furnished can reasonably be expected to improve the patient's condition.    Total Time Spent in Direct Patient Care:  I personally spent 25 minutes on the unit in  direct patient care. The direct patient care time included face-to-face time with the patient, reviewing the patient's chart, communicating with other professionals, and coordinating care. Greater than 50% of this time was spent in counseling or coordinating care with the patient regarding goals of hospitalization, psycho-education, and discharge planning needs.     Dina Rich OMS-4, Psychiatry Acting Intern  9/3/20241:05 PM

## 2022-12-01 NOTE — Group Note (Signed)
[facility] LCSW Group Therapy Note   Group Date: 12/01/2022 Start Time: 1100 End Time: 1144  Type of Therapy/Topic:  Group Therapy:  Feelings about Diagnosis  Participation Level:  Did Not Attend      Description of Group:    This group will allow patients to explore their thoughts and feelings about diagnoses they have received. Patients will be guided to explore their level of understanding and acceptance of these diagnoses. Facilitator will encourage patients to process their thoughts and feelings about the reactions of others to their diagnosis, and will guide patients in identifying ways to discuss their diagnosis with significant others in their lives. This group will be process-oriented, with patients participating in exploration of their own experiences as well as giving and receiving support and challenge from other group members.   Therapeutic Goals: 1. Patient will demonstrate understanding of diagnosis as evidence by identifying two or more symptoms of the disorder:  2. Patient will be able to express two feelings regarding the diagnosis 3. Patient will demonstrate ability to communicate their needs through discussion and/or role plays  Summary of Patient Progress:    Did not attend    Therapeutic Modalities:   Cognitive Behavioral Therapy Brief Therapy Feelings Identification    Janace Decker S Mylon Mabey, LCSW

## 2022-12-01 NOTE — Progress Notes (Signed)
   12/01/22 2000  Psych Admission Type (Psych Patients Only)  Admission Status Involuntary  Psychosocial Assessment  Patient Complaints Irritability  Eye Contact Fair  Facial Expression Angry  Affect Irritable  Speech Logical/coherent  Interaction Isolative  Motor Activity Slow  Appearance/Hygiene In hospital gown  Behavior Characteristics Unwilling to participate;Resistant to care  Mood Angry  Thought Process  Coherency WDL  Content WDL  Delusions None reported or observed  Perception WDL  Hallucination None reported or observed  Judgment Limited  Confusion None  Danger to Self  Current suicidal ideation? Denies  Danger to Others  Danger to Others None reported or observed

## 2022-12-01 NOTE — Group Note (Signed)
Recreation Therapy Group Note   Group Topic:Animal Assisted Therapy   Group Date: 12/01/2022 Start Time: 0950 End Time: 1030 Facilitators: Alfredo Spong-McCall, LRT,CTRS Location: [address] Dayroom   Animal-Assisted Activity (AAA) Program Checklist/Progress Notes Patient Eligibility Criteria Checklist & Daily Group note for Rec Tx Intervention  AAA/T Program Assumption of Risk Form signed by Patient/ or Parent Legal Guardian Yes  Patient understands his/her participation is voluntary Yes  Education: Charity fundraiser, Appropriate Animal Interaction   Education Outcome: Acknowledges education.    Affect/Mood: N/A   Participation Level: Did not attend    Clinical Observations/Individualized Feedback:     Plan: Continue to engage patient in RT group sessions 2-3x/week.   Tobi Groesbeck-McCall, LRT,CTRS 12/01/2022 12:55 PM

## 2022-12-01 NOTE — Plan of Care (Signed)
  Problem: Education: Goal: Emotional status will improve Outcome: Progressing Goal: Mental status will improve Outcome: Progressing   

## 2022-12-01 NOTE — Progress Notes (Signed)
Dar Note: Patient became angry and agitated when reminded about room lockout order.  Patient came out of her room to request for her pain medication(Percocet) which was no longer active at the time of her request.  Patient was offered Tylenol which she declined and went to lay down in front of her door due to her room being locked.  Staff redirected patient several times to get off the floor but refused.  While in her room patient started yelling and screaming for her pain medication/ demanding to see the unit director/hospital administration.  PRN Haldol 5 mg and Benadryl 50 mg IM given by manual hold for severe agitation.  Routine safety checks continues/maintained.  Food and fluid offered with support and encouragement.  Patient is safe on the unit at this time.

## 2022-12-01 NOTE — Progress Notes (Signed)
   12/01/22 1610  15 Minute Checks  Location Nurses Station  Visual Appearance Calm  Behavior Composed  Sleep (Behavioral Health Patients Only)  Calculate sleep? (Click Yes once per 24 hr at 0600 safety check) Yes  Documented sleep last 24 hours 8.75

## 2022-12-01 NOTE — BHH Counselor (Signed)
  12/01/2022  12:52 PM   Gita Kudo  Type of note: safety planning update   CSW has requested consent to contact someone for safety planning and Pt has denied consent. Pt refused to provided name or number for anyone.   Signed:  Marya Landry MSW, [facility] 12/01/2022  12:52 PM

## 2022-12-02 DIAGNOSIS — F332 Major depressive disorder, recurrent severe without psychotic features: Secondary | ICD-10-CM | POA: Diagnosis not present

## 2022-12-02 NOTE — BHH Counselor (Signed)
12/02/2022  Alisha Webster DOB: [birth year] MRN: [id number]   RIDER WAIVER AND RELEASE OF LIABILITY  For the purposes of helping with transportation needs, [facility] partners with outside transportation providers (taxi companies, [place], Catering manager.) to give [employer] patients or other approved people the choice of on-demand rides [employer]") to our buildings for non-emergency visits.  By using [employer], I, the person signing this document, on behalf of myself and/or any legal minors (in my care using the [employer]), agree:  Science writer given to me are supplied by independent, outside transportation providers who do not work for, or have any affiliation with, [employer]. [place] is not a transportation company. [facility] has no control over the quality or safety of the rides I get using [employer]. [place] has no control over whether any outside ride will happen on time or not. [facility] gives no guarantee on the reliability, quality, safety, or availability on any rides, or that no mistakes will happen. I know and accept that traveling by vehicle (car, truck, [employer], Zenaida Niece, bus, taxi, etc.) has risks of serious injuries such as disability, being paralyzed, and death. I know and agree the risk of using [employer] is mine alone, and not [employer]. Transport Services are provided "as is" and as are available. The transportation providers are in charge for all inspections and care of the vehicles used to provide these rides. I agree not to take legal action against [facility], its agents, employees, officers, directors, representatives, insurers, attorneys, assigns, successors, subsidiaries, and affiliates at any time for any reasons related directly or indirectly to using [employer]. I also agree not to take legal action against [facility] or its affiliates for any injury, death, or damage to property caused by or related to using  [employer]. I have read this Waiver and Release of Liability, and I understand the terms used in it and their legal meaning. This Waiver is freely and voluntarily given with the understanding that my right (or any legal minors) to legal action against  relating to [employer] is knowingly given up to use these services. Pt refused to sign  I attest that I read the Ride Waiver and Release of Liability to Alisha Webster, gave Ms. Pee the opportunity to ask questions and answered the questions asked (if any). I affirm that Michealle Camelo then provided consent for assistance with transportation.

## 2022-12-02 NOTE — Progress Notes (Signed)
  [facility] Adult Case Management Discharge Plan :  Will you be returning to the same living situation after discharge:  No. Pt will be going to a shelter At discharge, do you have transportation home?: No.Taxi Do you have the ability to pay for your medications: Yes,  Insured  Release of information consent forms completed and in the chart;  Patient's signature needed at discharge.  Patient to Follow up at:  Follow-up Information     [facility]. Schedule an appointment as soon as possible for a visit.   Why: A referral has been made to this provider for therapy and medication management services.  Please go by or call to schedule an appointment. Contact information: [address],  [place] [address] Hours: Mon-Fri: 8AM to 5PM  Phone: [phone] Fax: [phone]                Next level of care provider has access to [facility] Link:no  Safety Planning and Suicide Prevention discussed: No. Pt declined consents     Has patient been referred to the Quitline?: Patient does not use tobacco/nicotine products  Patient has been referred for addiction treatment: Yes, referral information given but appointment not made [facility]. [place]  (list facility). Patient to continue working towards treatment goals after discharge. Patient no longer meets criteria for inpatient criteria per attending physician. Continue taking medications as prescribed, nursing to provide instructions at discharge. Follow up with all scheduled appointments.   Chantilly Linskey S Rodric Punch, LCSW 12/02/2022, 9:06 AM

## 2022-12-02 NOTE — Discharge Summary (Signed)
Physician Discharge Summary Note  Patient:  Alisha Webster is an 52 y.o., female MRN:  [id number] DOB:  1970/07/16 Patient phone:  [phone] (home)  Patient address:   [address] [place] [address],  Total Time spent with patient: 45 minutes  Date of Admission:  11/23/2022 Date of Discharge: 12/02/2022  Reason for Admission:  Alisha Webster is a 52 y.o. female  with a past psychiatric history of depression and anxiety. Patient initially arrived to [facility] ED on 8/25 with stroke-like symptoms and was admitted to [facility] under IVC on 8/26 for endorsing suicidal ideation with intent and plan upon attempted discharge from ED. PMHx is significant for HTN, HLD, hypothyroidism, seizure disorder, and chronic pain.   Principal Problem: MDD (major depressive disorder), recurrent severe, without psychosis (HCC) Discharge Diagnoses: Principal Problem:   MDD (major depressive disorder), recurrent severe, without psychosis (HCC) Active Problems:   Anxiety disorder, unspecified   Past Psychiatric History:  Current Psychiatrist: Barbette Merino, NP at [facility]  Current Therapist: no current or previous therapy  Previous Psychiatric Diagnoses: anxiety and depression  Current psychiatric medications:  Effexor 150 mg every morning for depression and anxiety   Buspar 10 mg BID for irritability and anxiety (d/c during recent admission)  Ability 5 mg daily for depression and anxiety  (Prescribed by Barbette Merino, NP) Psychiatric medication history/compliance: patient reports taking medications as instructed  Psychiatric Hospitalization hx:  11/12/22, [facility]: Severe depression with SI  08/09/2022: [facility]: Admitted for overdose required life-supporting treatment. Patient denies recall of overdose or intentions.  Psychotherapy hx: no known history  Neuromodulation history: no known history  History of suicide (obtained from HPI):  Suspected suicide attempt via overdose as  described above (May 2024) History of homicide or aggression (obtained in HPI): patient denies   Past Medical History:  Past Medical History:  Diagnosis Date   Anxiety    Back pain    Depression    Hyperlipidemia    Hypertension     Past Surgical History:  Procedure Laterality Date   CHOLECYSTECTOMY     for gallstones   Tonisillectomy     TOTAL ABDOMINAL HYSTERECTOMY     One ovary left    Family History: History reviewed. No pertinent family history. Family Psychiatric  History:  Psychiatric Dx: Sister with bipolar disorder (deceased from "natural causes")  Suicide Hx: no known history              Violence/Aggression: no known history  Substance use: no known history Social History:  Social History   Substance and Sexual Activity  Alcohol Use No     Social History   Substance and Sexual Activity  Drug Use No    Social History   Socioeconomic History   Marital status: Divorced    Spouse name: Not on file   Number of children: Not on file   Years of education: Not on file   Highest education level: Not on file  Occupational History   Not on file  Tobacco Use   Smoking status: Never   Smokeless tobacco: Never  Vaping Use   Vaping status: Not on file  Substance and Sexual Activity   Alcohol use: No   Drug use: No   Sexual activity: Not on file  Other Topics Concern   Not on file  Social History Narrative   Not on file   Social Determinants of Health   Financial Resource Strain: High Risk (07/11/2018)   [facility] visits prior to 05/30/2022.   Overall Financial Resource Strain (CARDIA)    Difficulty of Paying Living Expenses: Hard  Food Insecurity: Food Insecurity Present (11/23/2022)   Hunger Vital Sign    Worried About Running Out of Food in the Last Year: Sometimes true    Ran Out of Food in the Last Year: Sometimes true  Transportation Needs: Patient Declined (12/02/2022)   PRAPARE - Scientist, research (physical sciences) (Medical): Patient declined    Lack of Transportation (Non-Medical): Patient declined  Recent Concern: Transportation Needs - Unmet Transportation Needs (12/02/2022)   PRAPARE - Administrator, Civil Service (Medical): Yes    Lack of Transportation (Non-Medical): Yes  Physical Activity: Not on file  Stress: Not on file  Social Connections: Unknown (08/05/2021)   Received from [employer]   Social Network    Social Network: Not on file   Living Situation:  Education: LPN Occupational hx: Unemployed for 6-7 years because patient took time off to care for her mother. Patient currently pending disability. Marital Status: Married twice, both ended in divorce  Children: 1 daughter (102 years old), lives in [place], [place]  Legal: DUI in 2019 (settled)  Military: no    Access to firearms: denies access   Substance Use History:  Alcohol: denies  Tobacco: denies  Cannabis: denies  Methamphetamine: once weekly (amount = ~ $20 per use) Rx drug abuse: no known history  Rehab hx: no known history   Hospital Course:   During the patient's hospitalization, patient had extensive initial psychiatric evaluation, and follow-up psychiatric evaluations every day.   Psychiatric diagnoses provided upon initial assessment: MDD, recurrent severe without psychotic features Anxiety disorder, unspecified     Patient's psychiatric medications were adjusted on admission: Initiate Effexor-XR 24 hr 150 mg capsule every morning for depression and anxiety, consider dosage increase to 225 mg tomorrow pending reassessment and patient tolerability   Initiate Abilify 5 mg daily for depression and anxiety augmentation, consider dosage increase to 10 mg tomorrow pending reassessment and patient tolerability   Start Desyrel 50 mg nightly PRN for sleep  Start Atarax 25 mg TID PRN for anxiety  Hold Buspar 10 mg BID given reported history of seizures and recent AKI with electrolyte disturbances,  consider d/c altogether    During the hospitalization, other adjustments were made to the patient's psychiatric medication regimen: Abilify was continued 5 mg daily for mood and depression, BuSpar was adjusted to 10 mg twice daily for anxiety, gabapentin was continued 200 mg 3 times daily for chronic pain, HCTZ was continued 25 mg daily for hypertension, Keppra was continued at 1000 mg twice daily for history of seizure disorder but unconfirmed, Synthroid was continued for hypothyroidism, Protonix was continued for GERD, Effexor was adjusted 150 mg daily for depression   Patient's care was discussed during the interdisciplinary team meeting every day during the hospitalization.   The patient denies having side effects to prescribed psychiatric medication.   It is to note that through out the  hospitalization patient was extremely manipulative, staff splitting with signs of significant borderline personality traits, refusing to cooperate with staff will get her vitals obtained most of the time, refusing to attend groups or participate in activities in the manage despite the fact of explaining to her several times purpose of programming and attending groups, she will refuse medications in a manipulative manner, complaining of pain related to neck surgery but did not appear to be in  pain, prior to hospitalization she was on oxycodone, was started during this hospital stay on Percocet, was noted to be manipulative complaining of severe pain when most of the time she appeared comfortable with no discomfort or distress noted.  Patient is homeless has no place to go to staff several times that if discharged to homeless shelter or street she will claim suicidal ideation to be readmitted, on the day prior to discharge she became agitated with the staff encouraged her to attend groups and participate in the milieu activities, had to be given Haldol/Benadryl as needed agitation protocol, she was noted to be disruptive  to the milieu on the unit secondary to her irritable and agitated behavior mainly related to personality traits.  As far as suicidal ideation patient clearly denied any suicidal ideation intention or plan also the hospitalization and when interviewed the day prior to discharge she reported last time had SI was 1 week prior to admission, when confronted with what she reported at time of admission and suicidal ideation intention and plan she responded in a daily manner that she does not recall details but clearly denied any active SI intention or plan, all through hospitalization she did not display any self injures or aggressive behavior except what was noted above. In the last 24 hours prior to discharge she refused all other medications except pain medication despite redirection, it was noted her refusal needed secondary to personality trait and behavioral.  She was not noted at any time to be psychotic or responding to stimuli. It becomes clear that the patient over presented her symptoms and found admission to gain shelter, has been manipulative making unreasonable request for placement to long-term facility and apartments from Child psychotherapist.   On day of discharge patient is awake and alert in her room answering very few questions no self injures or aggressive behavior noted does not appear responding to stimuli or paranoia she refuses to answer questions about suicidal or homicidal ideations at time of my evaluation on day of discharge but when asked with her if the answers to changes since yesterday she nods her head no, when asking her if she has any questions she responds verbally no, I explained to the patient discharge process and given she has been refusing medications for the past 24 hours will not provide any scripts, I explained to the patient that outpatient follow-up appointments will be on her discharge paperwork.   Labs were reviewed with the patient, and abnormal results were discussed with  the patient.   The patient is able to verbalize their individual safety plan to this provider.   Behavioral Events: Related to borderline personality trait, manipulative, staff splitting, refusing to cooperate with the staff to get lab work or vitals multiple times or attend groups during hospitalization. Secondary to patient's manipulative behavior and over presenting symptoms for secondary gain, she had minimal benefit from being in the hospital.   Restraints: None   Groups: Refused to attend despite repeated encouragement   Medications Changes: As above     Sleep  Sleep: Reportedly improved  Physical Findings: AIMS:  , ,  ,  ,    CIWA:    COWS:     Musculoskeletal: Strength & Muscle Tone: within normal limits Gait & Station: normal Patient leans: N/A   Psychiatric Specialty Exam:  General Appearance: appears at stated age, fairly dressed and groomed   Behavior: guarded refusing to cooperate   Psychomotor Activity:No psychomotor agitation or retardation noted  Eye Contact: poor Speech: decreased amount sec to guardedness     Mood: euthymic Affect: restricted Very manipulative behaviors, staff splitting Thought Process: concrete Descriptions of Associations: intact Thought Content: Hallucinations: does not appear responding to stimuli Delusions: No paranoia or other delusions noted Suicidal Thoughts: refusing to answer qs but no self injurious behaviors noted Homicidal Thoughts: no aggressive behavior noted   Alertness/Orientation: alert and fully oriented   Insight: limited Judgment: limited   Memory: intact   Executive Functions  Concentration: intact  Attention Span: Fair Recall: intact Fund of Knowledge: fair    Art therapist  Concentration: intact Attention Span: Fair Recall: intact Fund of Knowledge: fair   Assets  Assets: Desire for Improvement; Resilience  Physical Exam:  Physical Exam Vitals and nursing note reviewed.   Constitutional:      Appearance: Normal appearance.  HENT:     Head: Normocephalic and atraumatic.     Nose: Nose normal.  Eyes:     Pupils: Pupils are equal, round, and reactive to light.  Pulmonary:     Effort: Pulmonary effort is normal.  Musculoskeletal:        General: Normal range of motion.     Cervical back: Normal range of motion.  Neurological:     General: No focal deficit present.     Mental Status: She is alert. Mental status is at baseline.    Review of Systems  Reason unable to perform ROS: refusing to answer QS.   Blood pressure 119/83, pulse 62, temperature 97.6 F (36.4 C), temperature source Oral, resp. rate 18, height 5\' 4"  (1.626 m), weight 74.7 kg, SpO2 98%. Body mass index is 28.25 kg/m.   Social History   Tobacco Use  Smoking Status Never  Smokeless Tobacco Never   Tobacco Cessation:  N/A, patient does not currently use tobacco products   Blood Alcohol level:  Lab Results  Component Value Date   ETH <10 11/22/2022    Metabolic Disorder Labs:  No results found for: "HGBA1C", "MPG" No results found for: "PROLACTIN" No results found for: "CHOL", "TRIG", "HDL", "CHOLHDL", "VLDL", "LDLCALC"  See Psychiatric Specialty Exam and Suicide Risk Assessment completed by Attending Physician prior to discharge.  Discharge destination:  Other:  homeless shelter  Is patient on multiple antipsychotic therapies at discharge:  No   Has Patient had three or more failed trials of antipsychotic monotherapy by history:  No  Recommended Plan for Multiple Antipsychotic Therapies: NA  Discharge Instructions     Diet - low sodium heart healthy   Complete by: As directed    Increase activity slowly   Complete by: As directed       Allergies as of 12/02/2022       Reactions   Lisinopril Other (See Comments)   Makes kidneys shut down   Norvasc [amlodipine] Other (See Comments)   Leg swelling   Reglan [metoclopramide] Other (See Comments)   Felt hot,  neck flushed   Statins Other (See Comments)   Severe muscle pain        Medication List     STOP taking these medications    potassium chloride SA 20 MEQ tablet Commonly known as: KLOR-CON M   VITAMIN D PO       TAKE these medications      Indication  amLODipine 10 MG tablet Commonly known as: NORVASC Take 10 mg by mouth daily.  Indication: High Blood Pressure Disorder   ARIPiprazole 5 MG tablet Commonly known as: ABILIFY Take  5 mg by mouth daily.  Indication: Major Depressive Disorder   busPIRone 10 MG tablet Commonly known as: BUSPAR Take 10 mg by mouth 2 (two) times daily.  Indication: Anxiety Disorder   gabapentin 100 MG capsule Commonly known as: NEURONTIN Take 200 mg by mouth 3 (three) times daily.  Indication: Peripheral Nerve Disease   hydrochlorothiazide 25 MG tablet Commonly known as: HYDRODIURIL Take 25 mg by mouth daily.  Indication: High Blood Pressure Disorder   levETIRAcetam 1000 MG tablet Commonly known as: KEPPRA Take 1,000 mg by mouth 2 (two) times daily.  Indication: Seizure   levothyroxine 88 MCG tablet Commonly known as: SYNTHROID Take 88 mcg by mouth in the morning.  Indication: Underactive Thyroid   lisinopril 10 MG tablet Commonly known as: ZESTRIL Take 10 mg by mouth daily.  Indication: High Blood Pressure Disorder   omeprazole 20 MG capsule Commonly known as: PRILOSEC Take 20 mg by mouth daily.  Indication: Gastroesophageal Reflux Disease   Oxycodone HCl 10 MG Tabs Take 10 mg by mouth every 6 (six) hours as needed (pain).  Indication: Chronic Pain   venlafaxine XR 75 MG 24 hr capsule Commonly known as: EFFEXOR-XR Take 150 mg by mouth in the morning.  Indication: Major Depressive Disorder        Follow-up Information     [facility]. Go on 12/09/2022.   Why: You have an appointment for therapy and medication management services on 12/09/22 at 8:30 am.   This appointment will be held in  person. Contact information: [address] [place],  [place] [address] Hours: Mon-Fri: 8AM to 5PM  Phone: [phone] Fax: [phone]                Discharge recommendations:    Activity: as tolerated  Diet: heart healthy  # It is recommended to the patient to continue psychiatric medications as prescribed, after discharge from the hospital.     # It is recommended to the patient to follow up with your outpatient psychiatric provider and PCP.   # It was discussed with the patient, the impact of alcohol, drugs, tobacco have been there overall psychiatric and medical wellbeing, and total abstinence from substance use was recommended the patient.ed.   # In the event of worsening symptoms, the patient is instructed to call the crisis hotline, 911 and or go to the nearest ED for appropriate evaluation and treatment of symptoms. To follow-up with primary care provider for other medical issues, concerns and or health care needs   # Patient was discharged to homeless shelter with a plan to follow up as noted above.  -Follow-up with outpatient primary care doctor and other specialists -for management of chronic medical disease, including: Patient was recommended to see her primary care provider within 2 weeks of discharge for further follow-up for hypertension and her chronic medical problems including chronic pain and recent neck surgery.    Patient agrees with D/C instructions and plan.   The patient received suicide prevention pamphlet:  Yes Belongings returned:  Clothing and Valuables  Total Time Spent in Direct Patient Care:  I personally spent 45 minutes on the unit in direct patient care. The direct patient care time included face-to-face time with the patient, reviewing the patient's chart, communicating with other professionals, and coordinating care. Greater than 50% of this time was spent in counseling or coordinating care with the patient regarding goals of  hospitalization, psycho-education, and discharge planning needs.    Signed: Sarita Bottom,  MD 12/02/2022, 9:43 AM

## 2022-12-02 NOTE — Progress Notes (Signed)
   12/02/22 0603  Vital Signs  Pulse Rate 94  Pulse Rate Source Monitor  BP (!) 140/127  BP Method Automatic  Patient Position (if appropriate) Standing   BP elevated.  Patient refused to take BP med that was offered.

## 2022-12-02 NOTE — Progress Notes (Signed)
   12/02/22 0530  15 Minute Checks  Location Bedroom  Visual Appearance Calm  Behavior Sleeping  Sleep (Behavioral Health Patients Only)  Calculate sleep? (Click Yes once per 24 hr at 0600 safety check) Yes  Documented sleep last 24 hours 13.25

## 2022-12-02 NOTE — Progress Notes (Signed)
Patient is discharging at this time. Patient is A&Ox4. Stable. Patient denies SI,HI, and A/V/H with no plan/intent. Attempted to review printed AVS along with other discharge paperwork with patient, but patient refused all paperwork stating "I dont want anything from you, you stupid bitch." Patient refused completing the suicide safety plan form and refused to sign belongings sheet. All valuables/belongings returned to patient as witnessed by security. Patient is being transported by taxi. No s/s of current distress.

## 2022-12-02 NOTE — [facility] Suicide Risk Assessment (Addendum)
[facility] Discharge Suicide Risk Assessment   Principal Problem: MDD (major depressive disorder), recurrent severe, without psychosis (HCC) Discharge Diagnoses: Principal Problem:   MDD (major depressive disorder), recurrent severe, without psychosis (HCC) Active Problems:   Anxiety disorder, unspecified   Total Time spent with patient: 45 minutes  Reason for admission: Alisha Webster is a 52 y.o. female  with a past psychiatric history of depression and anxiety. Patient initially arrived to [facility] ED on 8/25 with stroke-like symptoms and was admitted to [facility] under IVC on 8/26 for endorsing suicidal ideation with intent and plan upon attempted discharge from ED. PMHx is significant for HTN, HLD, hypothyroidism, seizure disorder, and chronic pain.   PTA Medications:  Medications Prior to Admission  Medication Sig Dispense Refill Last Dose   amLODipine (NORVASC) 10 MG tablet Take 10 mg by mouth daily.         ARIPiprazole (ABILIFY) 5 MG tablet Take 5 mg by mouth daily.         busPIRone (BUSPAR) 10 MG tablet Take 10 mg by mouth 2 (two) times daily.         gabapentin (NEURONTIN) 100 MG capsule Take 200 mg by mouth 3 (three) times daily.         hydrochlorothiazide (HYDRODIURIL) 25 MG tablet Take 25 mg by mouth daily.         levETIRAcetam (KEPPRA) 1000 MG tablet Take 1,000 mg by mouth 2 (two) times daily.         levothyroxine (SYNTHROID) 88 MCG tablet Take 88 mcg by mouth in the morning.         lisinopril (ZESTRIL) 10 MG tablet Take 10 mg by mouth daily.         omeprazole (PRILOSEC) 20 MG capsule Take 20 mg by mouth daily.         Oxycodone HCl 10 MG TABS Take 10 mg by mouth every 6 (six) hours as needed (pain).         potassium chloride SA (KLOR-CON M) 20 MEQ tablet Take 20 mEq by mouth 2 (two) times daily.         venlafaxine XR (EFFEXOR-XR) 75 MG 24 hr capsule Take 150 mg by mouth in the morning.         VITAMIN D PO Take 1 tablet by mouth daily.          Hospital Course:   During the  patient's hospitalization, patient had extensive initial psychiatric evaluation, and follow-up psychiatric evaluations every day.  Psychiatric diagnoses provided upon initial assessment: MDD, recurrent severe without psychotic features Anxiety disorder, unspecified    Patient's psychiatric medications were adjusted on admission: Initiate Effexor-XR 24 hr 150 mg capsule every morning for depression and anxiety, consider dosage increase to 225 mg tomorrow pending reassessment and patient tolerability   Initiate Abilify 5 mg daily for depression and anxiety augmentation, consider dosage increase to 10 mg tomorrow pending reassessment and patient tolerability   Start Desyrel 50 mg nightly PRN for sleep  Start Atarax 25 mg TID PRN for anxiety  Hold Buspar 10 mg BID given reported history of seizures and recent AKI with electrolyte disturbances, consider d/c altogether   During the hospitalization, other adjustments were made to the patient's psychiatric medication regimen: Abilify was continued 5 mg daily for mood and depression, BuSpar was adjusted to 10 mg twice daily for anxiety, gabapentin was continued 200 mg 3 times daily for chronic pain, HCTZ was continued 25 mg daily for hypertension, Keppra was continued at  1000 mg twice daily for history of seizure disorder but unconfirmed, Synthroid was continued for hypothyroidism, Protonix was continued for GERD, Effexor was adjusted 150 mg daily for depression  Patient's care was discussed during the interdisciplinary team meeting every day during the hospitalization.  The patient denies having side effects to prescribed psychiatric medication.  It is to note that through out the  hospitalization patient was extremely manipulative, staff splitting with signs of significant borderline personality traits, refusing to cooperate with staff will get her vitals obtained most of the time, refusing to attend groups or participate in activities in the manage  despite the fact of explaining to her several times purpose of programming and attending groups, she will refuse medications in a manipulative manner, complaining of pain related to neck surgery but did not appear to be in pain, prior to hospitalization she was on oxycodone, was started during this hospital stay on Percocet, was noted to be manipulative complaining of severe pain when most of the time she appeared comfortable with no discomfort or distress noted.  Patient is homeless has no place to go to staff several times that if discharged to homeless shelter or street she will claim suicidal ideation to be readmitted, on the day prior to discharge she became agitated with the staff encouraged her to attend groups and participate in the milieu activities, had to be given Haldol/Benadryl as needed agitation protocol, she was noted to be disruptive to the milieu on the unit secondary to her irritable and agitated behavior mainly related to personality traits.  As far as suicidal ideation patient clearly denied any suicidal ideation intention or plan also the hospitalization and when interviewed the day prior to discharge she reported last time had SI was 1 week prior to admission, when confronted with what she reported at time of admission and suicidal ideation intention and plan she responded in a daily manner that she does not recall details but clearly denied any active SI intention or plan, all through hospitalization she did not display any self injures or aggressive behavior except what was noted above. In the last 24 hours prior to discharge she refused all other medications except pain medication despite redirection, it was noted her refusal needed secondary to personality trait and behavioral.  She was not noted at any time to be psychotic or responding to stimuli. It becomes clear that the patient over presented her symptoms and found admission to gain shelter, has been manipulative making unreasonable  request for placement to long-term facility and apartments from Child psychotherapist.  On day of discharge patient is awake and alert in her room answering very few questions no self injures or aggressive behavior noted does not appear responding to stimuli or paranoia she refuses to answer questions about suicidal or homicidal ideations at time of my evaluation on day of discharge but when asked with her if the answers to changes since yesterday she nods her head no, when asking her if she has any questions she responds verbally no, I explained to the patient discharge process and given she has been refusing medications for the past 24 hours will not provide any scripts, I explained to the patient that outpatient follow-up appointments will be on her discharge paperwork.  Labs were reviewed with the patient, and abnormal results were discussed with the patient.  The patient is able to verbalize their individual safety plan to this provider.  Behavioral Events: Related to borderline personality trait, manipulative, staff splitting, refusing to cooperate with  the staff to get lab work or vitals multiple times or attend groups during hospitalization. Secondary to patient's manipulative behavior and over presenting symptoms for secondary gain, she had minimal benefit from being in the hospital.  Restraints: None  Groups: Refused to attend despite repeated encouragement  Medications Changes: As above   Sleep  Sleep: Reportedly improved  Musculoskeletal: Strength & Muscle Tone: within normal limits Gait & Station: normal Patient leans: N/A  Psychiatric Specialty Exam  General Appearance: appears at stated age, fairly dressed and groomed  Behavior: guarded refusing to cooperate  Psychomotor Activity:No psychomotor agitation or retardation noted   Eye Contact: poor Speech: decreased amount sec to guardedness   Mood: euthymic Affect: restricted Very manipulative behaviors, staff  splitting Thought Process: concrete Descriptions of Associations: intact Thought Content: Hallucinations: does not appear responding to stimuli Delusions: No paranoia or other delusions noted Suicidal Thoughts: refusing to answer qs but no self injurious behaviors noted Homicidal Thoughts: no aggressive behavior noted  Alertness/Orientation: alert and fully oriented  Insight: limited Judgment: limited  Memory: intact  Executive Functions  Concentration: intact  Attention Span: Fair Recall: intact Fund of Knowledge: fair   Art therapist  Concentration: intact Attention Span: Fair Recall: intact Fund of Knowledge: fair   Assets  Assets: Desire for Improvement; Resilience   Physical Exam: Physical Exam ROS Blood pressure 119/83, pulse 62, temperature 97.6 F (36.4 C), temperature source Oral, resp. rate 18, height 5\' 4"  (1.626 m), weight 74.7 kg, SpO2 98%. Body mass index is 28.25 kg/m.  Mental Status Per Nursing Assessment::   On Admission:  NA  Demographic Factors:  Caucasian  Loss Factors: NA  Historical Factors: Prior suicide attempts and Impulsivity  Risk Reduction Factors:   NA  Continued Clinical Symptoms: No symptoms.  Or active SI intention or plan reported during hospital stay Depression:   Hopelessness Impulsivity  Cognitive Features That Contribute To Risk:  Closed-mindedness and Polarized thinking    Suicide Risk:  Minimal: No identifiable suicidal ideation.  Patients presenting with no risk factors but with morbid ruminations; may be classified as minimal risk based on the severity of the depressive symptoms   Follow-up Information     [facility]. Go on 12/09/2022.   Why: You have an appointment for therapy and medication management services on 12/09/22 at 8:30 am.   This appointment will be held in person. Contact information: [address],  [place] [address] Hours: Mon-Fri: Silvio Pate to 5PM  Phone:  [phone] Fax: [phone]                Plan Of Care/Follow-up recommendations:   Discharge recommendations:     Activity: as tolerated  Diet: heart healthy  # It is recommended to the patient to continue psychiatric medications as prescribed, after discharge from the hospital.     # It is recommended to the patient to follow up with your outpatient psychiatric provider and PCP.   # It was discussed with the patient, the impact of alcohol, drugs, tobacco have been there overall psychiatric and medical wellbeing, and total abstinence from substance use was recommended the patient.ed.   # In the event of worsening symptoms, the patient is instructed to call the crisis hotline, 911 and or go to the nearest ED for appropriate evaluation and treatment of symptoms. To follow-up with primary care provider for other medical issues, concerns and or health care needs   # Patient was discharged to homeless shelter with a plan  to follow up as noted above.  -Follow-up with outpatient primary care doctor and other specialists -for management of chronic medical disease, including: Patient was recommended to see her primary care provider within 2 weeks of discharge for further follow-up for hypertension and her chronic medical problems including chronic pain and recent neck surgery.   Patient agrees with D/C instructions and plan.  The patient received suicide prevention pamphlet:  Yes Belongings returned:  Clothing and Valuables  Total Time Spent in Direct Patient Care:  I personally spent 45 minutes on the unit in direct patient care. The direct patient care time included face-to-face time with the patient, reviewing the patient's chart, communicating with other professionals, and coordinating care. Greater than 50% of this time was spent in counseling or coordinating care with the patient regarding goals of hospitalization, psycho-education, and discharge planning needs.   Swan Zayed 12/02/2022, 9:27 AM

## 2022-12-04 ENCOUNTER — Emergency Department (HOSPITAL_COMMUNITY)
Admission: EM | Admit: 2022-12-04 | Discharge: 2022-12-04 | Disposition: A | Discharge status: Home / Self Care | Payer: Medicaid Other | Attending: Emergency Medicine | Admitting: Emergency Medicine

## 2022-12-04 ENCOUNTER — Other Ambulatory Visit: Payer: Self-pay

## 2022-12-04 ENCOUNTER — Encounter (HOSPITAL_COMMUNITY): Payer: Self-pay

## 2022-12-04 DIAGNOSIS — Z79899 Other long term (current) drug therapy: Secondary | ICD-10-CM | POA: Insufficient documentation

## 2022-12-04 DIAGNOSIS — M545 Low back pain, unspecified: Secondary | ICD-10-CM | POA: Insufficient documentation

## 2022-12-04 DIAGNOSIS — Z5900 Homelessness unspecified: Secondary | ICD-10-CM | POA: Diagnosis not present

## 2022-12-04 DIAGNOSIS — M25562 Pain in left knee: Secondary | ICD-10-CM | POA: Insufficient documentation

## 2022-12-04 DIAGNOSIS — G8929 Other chronic pain: Secondary | ICD-10-CM | POA: Insufficient documentation

## 2022-12-04 DIAGNOSIS — I10 Essential (primary) hypertension: Secondary | ICD-10-CM | POA: Diagnosis not present

## 2022-12-04 MED ORDER — METHOCARBAMOL 500 MG PO TABS
500.0000 mg | ORAL_TABLET | Freq: Once | ORAL | Status: AC
  Administered 2022-12-04: 500 mg via ORAL
  Filled 2022-12-04: qty 1

## 2022-12-04 MED ORDER — METHOCARBAMOL 500 MG PO TABS: 500.0000 mg | ORAL_TABLET | Freq: Two times a day (BID) | ORAL | 0 refills | Status: DC

## 2022-12-04 MED ORDER — HYDROCODONE-ACETAMINOPHEN 5-325 MG PO TABS
1.0000 | ORAL_TABLET | Freq: Once | ORAL | Status: AC
  Administered 2022-12-04: 1 via ORAL
  Filled 2022-12-04: qty 1

## 2022-12-04 NOTE — ED Provider Notes (Signed)
[place] EMERGENCY DEPARTMENT AT [facility] Provider Note   CSN: [id number] Arrival date & time: 12/04/22  5956     History  Chief Complaint  Patient presents with   Back Pain    Alisha Webster is a 52 y.o. female.   Patient presents to the emergency room complaining of bilateral low back pain.  Patient is currently homeless and frequently has to sleep on the streets.  She states that she believes this may be exacerbating her pain.  She denies any associated weakness, saddle anesthesia, urinary incontinence, urinary retention, fecal incontinence.  She denies any trauma to the area.  She does endorse recent cervical surgery but no surgery in the thoracic or lumbar spine region.  Patient also complains of left-sided knee pain and states she knows she needs bilateral knee replacements but due to housing instability is not currently a candidate for arthroplasty.  She states she worries she may have torn something in the left knee.  Past medical history significant for hypertension, depression, chronic back.   Back Pain      Home Medications Prior to Admission medications   Medication Sig Start Date End Date Taking? Authorizing Provider  ARIPiprazole (ABILIFY) 5 MG tablet Take 5 mg by mouth daily.   Yes [provider]  busPIRone (BUSPAR) 10 MG tablet Take 10 mg by mouth 2 (two) times daily.   Yes [provider]  gabapentin (NEURONTIN) 100 MG capsule Take 200 mg by mouth 3 (three) times daily.   Yes [provider]  hydrochlorothiazide (HYDRODIURIL) 25 MG tablet Take 25 mg by mouth daily.   Yes [provider]  levETIRAcetam (KEPPRA) 1000 MG tablet Take 1,000 mg by mouth 2 (two) times daily.   Yes [provider]  levothyroxine (SYNTHROID) 88 MCG tablet Take 88 mcg by mouth in the morning.   Yes [provider]  methocarbamol (ROBAXIN) 500 MG tablet Take 1 tablet (500 mg total) by mouth 2 (two) times daily. 12/04/22  Yes  Darrick Grinder, PA-C  omeprazole (PRILOSEC) 20 MG capsule Take 20 mg by mouth daily. 09/03/22 12/04/22 Yes [provider]  venlafaxine XR (EFFEXOR-XR) 75 MG 24 hr capsule Take 150 mg by mouth in the morning.   Yes [provider]  amLODipine (NORVASC) 10 MG tablet Take 10 mg by mouth daily. Patient not taking: Reported on 12/04/2022    [provider]      Allergies    Acetaminophen, Lisinopril, Norvasc [amlodipine], Reglan [metoclopramide], and Statins    Review of Systems   Review of Systems  Musculoskeletal:  Positive for back pain.    Physical Exam Updated Vital Signs BP 134/86   Pulse 67   Temp 97.6 F (36.4 C) (Oral)   Ht 5\' 4"  (1.626 m)   Wt 77.1 kg   SpO2 97%   BMI 29.18 kg/m  Physical Exam Vitals and nursing note reviewed.  Constitutional:      General: She is not in acute distress.    Appearance: She is well-developed.  HENT:     Head: Normocephalic and atraumatic.  Eyes:     Conjunctiva/sclera: Conjunctivae normal.  Cardiovascular:     Rate and Rhythm: Normal rate and regular rhythm.  Pulmonary:     Effort: Pulmonary effort is normal. No respiratory distress.     Breath sounds: Normal breath sounds.  Abdominal:     Palpations: Abdomen is soft.     Tenderness: There is no abdominal tenderness.  Musculoskeletal:  General: Tenderness present. No swelling.     Cervical back: Neck supple.     Comments: Patient with lumbar tenderness bilaterally with no midline spinal tenderness.  Negative SLR bilaterally.  Patient with no tenderness to palpation of left knee but pain with passive flexion.  Equivocal McMurray testing.  Skin:    General: Skin is warm and dry.     Capillary Refill: Capillary refill takes less than 2 seconds.  Neurological:     Mental Status: She is alert.  Psychiatric:        Mood and Affect: Mood normal.     ED Results / Procedures / Treatments   Labs (all labs ordered are listed, but only abnormal results  are displayed) Labs Reviewed - No data to display  EKG None  Radiology No results found.  Procedures Procedures    Medications Ordered in ED Medications  HYDROcodone-acetaminophen (NORCO/VICODIN) 5-325 MG per tablet 1 tablet (has no administration in time range)  methocarbamol (ROBAXIN) tablet 500 mg (has no administration in time range)    ED Course/ Medical Decision Making/ A&P                                 Medical Decision Making Risk Prescription drug management.   This patient presents to the ED for concern of back pain, this involves an extensive number of treatment options, and is a complaint that carries with it a high risk of complications and morbidity.  The differential diagnosis includes lumbar strain, fracture, dislocation, cauda equina, others   Co morbidities that complicate the patient evaluation  History of chronic back pain   Additional history obtained:  Additional history obtained from EMS External records from outside source obtained and reviewed including recent notes showing admission to behavioral health   Imaging Studies ordered:  I considered imaging of the lumbar spine but the patient has no red flag symptoms at this time.  Problem List / ED Course / Critical interventions / Medication management   I ordered medication including hydrocodone and Robaxin for back pain Reevaluation of the patient after these medicines showed that the patient improved I have reviewed the patients home medicines and have made adjustments as needed   Social Determinants of Health:  Patient is currently homeless   Test / Admission - Considered:  Patient's presentation consistent with acute low back pain.  No red flag symptoms to suggest cauda equina.  No trauma to suggest fracture or dislocation.  Plan to treat with muscle relaxant and recommend over-the-counter medication for inflammation/pain control.  Patient mentions left knee pain.  Patient  states she has known end-stage arthritis and needs a knee replacement.  Even if patient had a ligamentous or meniscal injury, unlikely there would be any surgical options other than arthroplasty.  Recommend patient follow-up with orthopedics as needed.         Final Clinical Impression(s) / ED Diagnoses Final diagnoses:  Acute bilateral low back pain without sciatica  Chronic pain of left knee    Rx / DC Orders ED Discharge Orders          Ordered    methocarbamol (ROBAXIN) 500 MG tablet  2 times daily        12/04/22 0328              Darrick Grinder, PA-C 12/04/22 0328    Glynn Octave, MD 12/04/22 [phone]

## 2022-12-04 NOTE — ED Notes (Signed)
Pt is very upset upon discharge. Pt interrupted RN on multiple occasions while attempting to review discharge instructions. Pt then stated "Y'all didn't do anything just throw those papers over there." Pt offered bus pass home. Pt refused.

## 2022-12-04 NOTE — Discharge Instructions (Signed)
You were evaluated tonight for back pain and knee pain.  Your back pain is likely muscular in nature.  Please take the prescribed muscle relaxant.  You may also take over-the-counter medication such as Tylenol and ibuprofen for pain and inflammation control.  Follow-up as needed with your primary care provider.  As to your knee pain, I recommend follow-up with orthopedics as needed

## 2022-12-04 NOTE — ED Triage Notes (Signed)
BIBA - pt c/o middle lower back pain. Hx spinal fusion surgery 6 weeks ago. Became homeless 3 weeks ago and had been sleeping outside on concrete and may have aggravated her back. C/o left knee pain. Denies injury/falls. Pt took ibuprofen and gabapentin at 0100 without relief.

## 2022-12-11 ENCOUNTER — Emergency Department (HOSPITAL_COMMUNITY): Payer: Medicaid Other

## 2022-12-11 ENCOUNTER — Emergency Department (HOSPITAL_COMMUNITY)
Admission: EM | Admit: 2022-12-11 | Discharge: 2022-12-12 | Disposition: A | Discharge status: Home / Self Care | Payer: Medicaid Other | Attending: Emergency Medicine | Admitting: Emergency Medicine

## 2022-12-11 ENCOUNTER — Other Ambulatory Visit: Payer: Self-pay

## 2022-12-11 ENCOUNTER — Encounter (HOSPITAL_COMMUNITY): Payer: Self-pay

## 2022-12-11 DIAGNOSIS — Z79890 Hormone replacement therapy (postmenopausal): Secondary | ICD-10-CM | POA: Insufficient documentation

## 2022-12-11 DIAGNOSIS — F410 Panic disorder [episodic paroxysmal anxiety] without agoraphobia: Secondary | ICD-10-CM | POA: Insufficient documentation

## 2022-12-11 DIAGNOSIS — Z79899 Other long term (current) drug therapy: Secondary | ICD-10-CM | POA: Diagnosis not present

## 2022-12-11 DIAGNOSIS — R42 Dizziness and giddiness: Secondary | ICD-10-CM | POA: Diagnosis present

## 2022-12-11 DIAGNOSIS — X501XXA Overexertion from prolonged static or awkward postures, initial encounter: Secondary | ICD-10-CM | POA: Diagnosis not present

## 2022-12-11 DIAGNOSIS — W109XXA Fall (on) (from) unspecified stairs and steps, initial encounter: Secondary | ICD-10-CM | POA: Insufficient documentation

## 2022-12-11 DIAGNOSIS — E876 Hypokalemia: Secondary | ICD-10-CM

## 2022-12-11 DIAGNOSIS — T1491XA Suicide attempt, initial encounter: Secondary | ICD-10-CM | POA: Insufficient documentation

## 2022-12-11 DIAGNOSIS — E039 Hypothyroidism, unspecified: Secondary | ICD-10-CM | POA: Diagnosis not present

## 2022-12-11 DIAGNOSIS — I10 Essential (primary) hypertension: Secondary | ICD-10-CM | POA: Insufficient documentation

## 2022-12-11 DIAGNOSIS — Y92039 Unspecified place in apartment as the place of occurrence of the external cause: Secondary | ICD-10-CM | POA: Insufficient documentation

## 2022-12-11 DIAGNOSIS — M25562 Pain in left knee: Secondary | ICD-10-CM

## 2022-12-11 DIAGNOSIS — F332 Major depressive disorder, recurrent severe without psychotic features: Secondary | ICD-10-CM | POA: Insufficient documentation

## 2022-12-11 LAB — CBC
HCT: 41.5 % (ref 36.0–46.0)
Hemoglobin: 14 g/dL (ref 12.0–15.0)
MCH: 29.9 pg (ref 26.0–34.0)
MCHC: 33.7 g/dL (ref 30.0–36.0)
MCV: 88.7 fL (ref 80.0–100.0)
Platelets: 313 10*3/uL (ref 150–400)
RBC: 4.68 MIL/uL (ref 3.87–5.11)
RDW: 11.9 % (ref 11.5–15.5)
WBC: 7.4 10*3/uL (ref 4.0–10.5)
nRBC: 0 % (ref 0.0–0.2)

## 2022-12-11 LAB — COMPREHENSIVE METABOLIC PANEL
ALT: 21 U/L (ref 0–44)
AST: 22 U/L (ref 15–41)
Albumin: 3.6 g/dL (ref 3.5–5.0)
Alkaline Phosphatase: 96 U/L (ref 38–126)
Anion gap: 14 (ref 5–15)
BUN: 11 mg/dL (ref 6–20)
CO2: 23 mmol/L (ref 22–32)
Calcium: 8.6 mg/dL — ABNORMAL LOW (ref 8.9–10.3)
Chloride: 103 mmol/L (ref 98–111)
Creatinine, Ser: 0.72 mg/dL (ref 0.44–1.00)
GFR, Estimated: >60 mL/min (ref >60)
Glucose, Bld: 103 mg/dL — ABNORMAL HIGH (ref 70–99)
Potassium: 2.9 mmol/L — ABNORMAL LOW (ref 3.5–5.1)
Sodium: 140 mmol/L (ref 135–145)
Total Bilirubin: 0.3 mg/dL (ref 0.3–1.2)
Total Protein: 6.7 g/dL (ref 6.5–8.1)

## 2022-12-11 MED ORDER — OXYCODONE HCL 5 MG PO TABS
5.0000 mg | ORAL_TABLET | Freq: Once | ORAL | Status: AC
  Administered 2022-12-11: 5 mg via ORAL
  Filled 2022-12-11: qty 1

## 2022-12-11 MED ORDER — POTASSIUM CHLORIDE CRYS ER 20 MEQ PO TBCR
40.0000 meq | EXTENDED_RELEASE_TABLET | Freq: Once | ORAL | Status: AC
  Administered 2022-12-12: 40 meq via ORAL
  Filled 2022-12-11: qty 2

## 2022-12-11 NOTE — ED Triage Notes (Signed)
Pt BIB [facility] after being found sleeping in an apartment building stairwell. The staff at the complex could not get her to leave d/t pt stating that she cannot stand on her Lt leg d/t twisting it on the stairs, unknown when it happened. Pt also endorses dizziness d/t not having any food in 4 days or drink in 2 days.   144/89  65 bpm 105 cbg 16 resp

## 2022-12-11 NOTE — ED Provider Triage Note (Signed)
Emergency Medicine Provider Triage Evaluation Note  Alisha Webster , a 52 y.o. female  was evaluated in triage.  Pt complains of pain in the left knee since she fell down some stairs today when she was trying to get up.  Since she fell she has been unable to bear weight because the knee hurts so badly..  Review of Systems  Positive: Left knee pain Negative: New neck pain since her surgery last week, loss of consciousness.  No hip or ankle pain on the left  Physical Exam  BP (!) 151/104   Pulse 71   Temp (!) 97.4 F (36.3 C) (Oral)   Resp 16   SpO2 100%  Gen:   Awake, no distress   Resp:  Normal effort  MSK:   Unable to extend at the knee due to pain on the medial aspect.  Left hip and ankle are normal Other:  Pulses are intact.  Well-healing scar on anterior neck.  No posterior neck pain  Medical Decision Making  Medically screening exam initiated at 6:50 PM.  Appropriate orders placed.  Alisha Webster was informed that the remainder of the evaluation will be completed by another provider, this initial triage assessment does not replace that evaluation, and the importance of remaining in the ED until their evaluation is complete.     Gwyneth Sprout, MD 12/11/22 [phone]

## 2022-12-11 NOTE — ED Provider Notes (Signed)
[facility] DEPARTMENT AT [facility] Provider Note   CSN: [id number] Arrival date & time: 12/11/22  1815     History {Add pertinent medical, surgical, social history, OB history to HPI:1} Chief Complaint  Patient presents with   Knee Injury   Dizziness    Alisha Webster is a 52 y.o. female.  The history is provided by the patient and medical records.  Dizziness Alisha Webster is a 52 y.o. female who presents to the Emergency Department complaining of dizziness.  She presents to the emergency department for evaluation of dizziness when she stands for the last 24 hours.  She states that she is currently unhoused and has not eaten for 4 to 5 days.  She was trying to get up some stairs and she fell and twisted her left knee.  She now has pain in her left knee and cannot stand well secondary to this pain.  She does have a history of hypertension, depression.  She is unsure if she took her medication today due to social stressors.  She is typically compliant with her medications.  No head injury or loss of consciousness.  She states that she is currently not staying at a shelter secondary to no transportation to get to the shelter.  She did have a cervical discectomy and fusion on July 16 at [facility].      Home Medications Prior to Admission medications   Medication Sig Start Date End Date Taking? Authorizing Provider  amLODipine (NORVASC) 10 MG tablet Take 10 mg by mouth daily. Patient not taking: Reported on 12/04/2022    [provider]  ARIPiprazole (ABILIFY) 5 MG tablet Take 5 mg by mouth daily.    [provider]  busPIRone (BUSPAR) 10 MG tablet Take 10 mg by mouth 2 (two) times daily.    [provider]  gabapentin (NEURONTIN) 100 MG capsule Take 200 mg by mouth 3 (three) times daily.    [provider]  hydrochlorothiazide (HYDRODIURIL) 25 MG tablet Take 25 mg by mouth daily.    [provider]  levETIRAcetam  (KEPPRA) 1000 MG tablet Take 1,000 mg by mouth 2 (two) times daily.    [provider]  levothyroxine (SYNTHROID) 88 MCG tablet Take 88 mcg by mouth in the morning.    [provider]  methocarbamol (ROBAXIN) 500 MG tablet Take 1 tablet (500 mg total) by mouth 2 (two) times daily. 12/04/22   Darrick Grinder, PA-C  omeprazole (PRILOSEC) 20 MG capsule Take 20 mg by mouth daily. 09/03/22 12/04/22  [provider]  venlafaxine XR (EFFEXOR-XR) 75 MG 24 hr capsule Take 150 mg by mouth in the morning.    [provider]      Allergies    Acetaminophen, Lisinopril, Norvasc [amlodipine], Reglan [metoclopramide], and Statins    Review of Systems   Review of Systems  Neurological:  Positive for dizziness.  All other systems reviewed and are negative.   Physical Exam Updated Vital Signs BP (!) 151/104   Pulse 71   Temp (!) 97.4 F (36.3 C) (Oral)   Resp 16   SpO2 100%  Physical Exam Vitals and nursing note reviewed.  Constitutional:      Appearance: She is well-developed.  HENT:     Head: Normocephalic and atraumatic.  Cardiovascular:     Rate and Rhythm: Normal rate and regular rhythm.     Heart sounds: No murmur heard. Pulmonary:     Effort: Pulmonary effort is  normal. No respiratory distress.     Breath sounds: Normal breath sounds.  Abdominal:     Palpations: Abdomen is soft.     Tenderness: There is no abdominal tenderness. There is no guarding or rebound.  Musculoskeletal:     Comments: Mild tenderness to palpation over the left knee without any palpable effusion or edema.  She is able to flex and extend the knee.  2+ DP pulses bilaterally.  Skin:    General: Skin is warm and dry.  Neurological:     Mental Status: She is alert and oriented to person, place, and time.     Comments: No asymmetry of facial movements.  5 out of 5 strength in all 4 extremities.  Psychiatric:        Behavior: Behavior normal.     ED Results / Procedures /  Treatments   Labs (all labs ordered are listed, but only abnormal results are displayed) Labs Reviewed  COMPREHENSIVE METABOLIC PANEL - Abnormal; Notable for the following components:      Result Value   Potassium 2.9 (*)    Glucose, Bld 103 (*)    Calcium 8.6 (*)    All other components within normal limits  CBC    EKG None  Radiology DG Knee Complete 4 Views Left  Result Date: 12/11/2022 CLINICAL DATA:  Fall with left knee injury EXAM: LEFT KNEE - COMPLETE 4+ VIEW COMPARISON:  05/24/2022 FINDINGS: Osteoarthritis with substantial tricompartmental spurring and mild articular space narrowing in the medial compartment. Stable small fragmented osteophyte just above the patella. Patellofemoral articular space narrowing. No fracture or definite knee effusion.  No acute bony findings. IMPRESSION: 1. No acute bony findings. 2. Osteoarthritis. Electronically Signed   By: Gaylyn Rong M.D.   On: 12/11/2022 20:46    Procedures Procedures  {Document cardiac monitor, telemetry assessment procedure when appropriate:1}  Medications Ordered in ED Medications  oxyCODONE (Oxy IR/ROXICODONE) immediate release tablet 5 mg (5 mg Oral Given 12/11/22 1853)    ED Course/ Medical Decision Making/ A&P   {   Click here for ABCD2, HEART and other calculatorsREFRESH Note before signing :1}                              Medical Decision Making Amount and/or Complexity of Data Reviewed Labs: ordered. Radiology: ordered.   ***  {Document critical care time when appropriate:1} {Document review of labs and clinical decision tools ie heart score, Chads2Vasc2 etc:1}  {Document your independent review of radiology images, and any outside records:1} {Document your discussion with family members, caretakers, and with consultants:1} {Document social determinants of health affecting pt's care:1} {Document your decision making why or why not admission, treatments were needed:1} Final Clinical  Impression(s) / ED Diagnoses Final diagnoses:  None    Rx / DC Orders ED Discharge Orders     None

## 2022-12-12 ENCOUNTER — Emergency Department (HOSPITAL_COMMUNITY): Payer: Medicaid Other

## 2022-12-12 ENCOUNTER — Emergency Department (EMERGENCY_DEPARTMENT_HOSPITAL)
Admission: EM | Admit: 2022-12-12 | Discharge: 2022-12-14 | Disposition: A | Discharge status: Home / Self Care | Payer: Medicaid Other | Source: Home / Self Care | Attending: Emergency Medicine | Admitting: Emergency Medicine

## 2022-12-12 ENCOUNTER — Encounter (HOSPITAL_COMMUNITY): Payer: Self-pay

## 2022-12-12 DIAGNOSIS — Z79899 Other long term (current) drug therapy: Secondary | ICD-10-CM | POA: Insufficient documentation

## 2022-12-12 DIAGNOSIS — X58XXXA Exposure to other specified factors, initial encounter: Secondary | ICD-10-CM | POA: Insufficient documentation

## 2022-12-12 DIAGNOSIS — F410 Panic disorder [episodic paroxysmal anxiety] without agoraphobia: Secondary | ICD-10-CM | POA: Insufficient documentation

## 2022-12-12 DIAGNOSIS — F411 Generalized anxiety disorder: Secondary | ICD-10-CM | POA: Diagnosis present

## 2022-12-12 DIAGNOSIS — F332 Major depressive disorder, recurrent severe without psychotic features: Secondary | ICD-10-CM | POA: Diagnosis present

## 2022-12-12 DIAGNOSIS — R42 Dizziness and giddiness: Secondary | ICD-10-CM | POA: Insufficient documentation

## 2022-12-12 DIAGNOSIS — T1491XA Suicide attempt, initial encounter: Secondary | ICD-10-CM | POA: Insufficient documentation

## 2022-12-12 DIAGNOSIS — I10 Essential (primary) hypertension: Secondary | ICD-10-CM | POA: Insufficient documentation

## 2022-12-12 LAB — URINALYSIS, W/ REFLEX TO CULTURE (INFECTION SUSPECTED)
Bilirubin Urine: NEGATIVE
Glucose, UA: NEGATIVE mg/dL
Hgb urine dipstick: NEGATIVE
Ketones, ur: NEGATIVE mg/dL
Nitrite: NEGATIVE
Protein, ur: NEGATIVE mg/dL
Specific Gravity, Urine: 1.02 (ref 1.005–1.030)
pH: 6 (ref 5.0–8.0)

## 2022-12-12 LAB — SALICYLATE LEVEL: Salicylate Lvl: <7 mg/dL — ABNORMAL LOW (ref 7.0–30.0)

## 2022-12-12 LAB — COMPREHENSIVE METABOLIC PANEL
ALT: 25 U/L (ref 0–44)
AST: 26 U/L (ref 15–41)
Albumin: 3.6 g/dL (ref 3.5–5.0)
Alkaline Phosphatase: 96 U/L (ref 38–126)
Anion gap: 9 (ref 5–15)
BUN: 14 mg/dL (ref 6–20)
CO2: 28 mmol/L (ref 22–32)
Calcium: 8.5 mg/dL — ABNORMAL LOW (ref 8.9–10.3)
Chloride: 102 mmol/L (ref 98–111)
Creatinine, Ser: 0.76 mg/dL (ref 0.44–1.00)
GFR, Estimated: >60 mL/min (ref >60)
Glucose, Bld: 108 mg/dL — ABNORMAL HIGH (ref 70–99)
Potassium: 3.4 mmol/L — ABNORMAL LOW (ref 3.5–5.1)
Sodium: 139 mmol/L (ref 135–145)
Total Bilirubin: 0.7 mg/dL (ref 0.3–1.2)
Total Protein: 6.6 g/dL (ref 6.5–8.1)

## 2022-12-12 LAB — CBC
HCT: 40.2 % (ref 36.0–46.0)
Hemoglobin: 13.4 g/dL (ref 12.0–15.0)
MCH: 29.5 pg (ref 26.0–34.0)
MCHC: 33.3 g/dL (ref 30.0–36.0)
MCV: 88.4 fL (ref 80.0–100.0)
Platelets: 274 10*3/uL (ref 150–400)
RBC: 4.55 MIL/uL (ref 3.87–5.11)
RDW: 11.9 % (ref 11.5–15.5)
WBC: 6.2 10*3/uL (ref 4.0–10.5)
nRBC: 0 % (ref 0.0–0.2)

## 2022-12-12 LAB — ACETAMINOPHEN LEVEL: Acetaminophen (Tylenol), Serum: <10 ug/mL — ABNORMAL LOW (ref 10–30)

## 2022-12-12 LAB — ETHANOL: Alcohol, Ethyl (B): <10 mg/dL (ref <10)

## 2022-12-12 LAB — MAGNESIUM
Magnesium: 1.5 mg/dL — ABNORMAL LOW (ref 1.7–2.4)
Magnesium: 2.1 mg/dL (ref 1.7–2.4)

## 2022-12-12 MED ORDER — ALUM & MAG HYDROXIDE-SIMETH 200-200-20 MG/5ML PO SUSP: 30.0000 mL | Freq: Four times a day (QID) | ORAL | Status: DC | PRN

## 2022-12-12 MED ORDER — LEVOTHYROXINE SODIUM 88 MCG PO TABS: 88.0000 ug | ORAL_TABLET | Freq: Every morning | ORAL | Status: DC

## 2022-12-12 MED ORDER — RISPERIDONE 1 MG PO TBDP
2.0000 mg | ORAL_TABLET | Freq: Three times a day (TID) | ORAL | Status: DC | PRN
  Filled 2022-12-12: qty 2

## 2022-12-12 MED ORDER — ONDANSETRON HCL 4 MG PO TABS: 4.0000 mg | ORAL_TABLET | Freq: Three times a day (TID) | ORAL | Status: DC | PRN

## 2022-12-12 MED ORDER — ARIPIPRAZOLE 5 MG PO TABS
5.0000 mg | ORAL_TABLET | Freq: Every day | ORAL | Status: DC
  Administered 2022-12-12 – 2022-12-14 (×3): 5 mg via ORAL
  Filled 2022-12-12 (×3): qty 1

## 2022-12-12 MED ORDER — LEVOTHYROXINE SODIUM 88 MCG PO TABS
88.0000 ug | ORAL_TABLET | Freq: Every day | ORAL | Status: DC
  Administered 2022-12-13 – 2022-12-14 (×2): 88 ug via ORAL
  Filled 2022-12-12 (×3): qty 1

## 2022-12-12 MED ORDER — MAGNESIUM SULFATE 2 GM/50ML IV SOLN
2.0000 g | Freq: Once | INTRAVENOUS | Status: AC
  Administered 2022-12-12: 2 g via INTRAVENOUS
  Filled 2022-12-12: qty 50

## 2022-12-12 MED ORDER — ZOLPIDEM TARTRATE 5 MG PO TABS: 5.0000 mg | ORAL_TABLET | Freq: Every evening | ORAL | Status: DC | PRN

## 2022-12-12 MED ORDER — ZIPRASIDONE MESYLATE 20 MG IM SOLR
20.0000 mg | INTRAMUSCULAR | Status: AC | PRN
  Administered 2022-12-13: 20 mg via INTRAMUSCULAR
  Filled 2022-12-12: qty 20

## 2022-12-12 MED ORDER — IBUPROFEN 400 MG PO TABS
600.0000 mg | ORAL_TABLET | Freq: Three times a day (TID) | ORAL | Status: DC | PRN
  Administered 2022-12-13 – 2022-12-14 (×2): 600 mg via ORAL
  Filled 2022-12-12 (×2): qty 1

## 2022-12-12 MED ORDER — VENLAFAXINE HCL ER 75 MG PO CP24: 150.0000 mg | ORAL_CAPSULE | Freq: Every morning | ORAL | Status: DC

## 2022-12-12 MED ORDER — POTASSIUM CHLORIDE CRYS ER 20 MEQ PO TBCR
40.0000 meq | EXTENDED_RELEASE_TABLET | Freq: Once | ORAL | Status: DC
  Filled 2022-12-12 (×2): qty 2

## 2022-12-12 MED ORDER — LORAZEPAM 1 MG PO TABS
1.0000 mg | ORAL_TABLET | ORAL | Status: DC | PRN
  Filled 2022-12-12: qty 1

## 2022-12-12 MED ORDER — VENLAFAXINE HCL ER 150 MG PO CP24
150.0000 mg | ORAL_CAPSULE | Freq: Every day | ORAL | Status: DC
  Administered 2022-12-13 – 2022-12-14 (×2): 150 mg via ORAL
  Filled 2022-12-12: qty 2
  Filled 2022-12-12: qty 1

## 2022-12-12 MED ORDER — POTASSIUM CHLORIDE CRYS ER 20 MEQ PO TBCR: 20.0000 meq | EXTENDED_RELEASE_TABLET | Freq: Every day | ORAL | 0 refills | Status: DC

## 2022-12-12 MED ORDER — LEVETIRACETAM 500 MG PO TABS
1000.0000 mg | ORAL_TABLET | Freq: Two times a day (BID) | ORAL | Status: DC
  Administered 2022-12-12 – 2022-12-14 (×4): 1000 mg via ORAL
  Filled 2022-12-12 (×5): qty 2

## 2022-12-12 MED ORDER — POTASSIUM CHLORIDE CRYS ER 20 MEQ PO TBCR
40.0000 meq | EXTENDED_RELEASE_TABLET | Freq: Once | ORAL | Status: DC
  Filled 2022-12-12: qty 2

## 2022-12-12 MED ORDER — NICOTINE 21 MG/24HR TD PT24
21.0000 mg | MEDICATED_PATCH | Freq: Every day | TRANSDERMAL | Status: DC
  Filled 2022-12-12 (×2): qty 1

## 2022-12-12 NOTE — ED Notes (Signed)
Sitter bedside with patient.

## 2022-12-12 NOTE — ED Notes (Addendum)
Pt refusing vitals at this time.

## 2022-12-12 NOTE — ED Notes (Signed)
Patient's bottle of gabapentin counted/inventoried, then sent to pharmacy. Patient signed to confirm count

## 2022-12-12 NOTE — ED Notes (Signed)
Pt refused vitals

## 2022-12-12 NOTE — ED Provider Notes (Signed)
[facility] AT [facility] Provider Note   CSN: [id number] Arrival date & time: 12/12/22  0946     History  Chief Complaint  Patient presents with   Suicide Attempt    Alisha Webster is a 52 y.o. female.  The history is provided by the patient, the police and medical records. No language interpreter was used.     52 year old female significant history of anxiety, depression, hypertension, Sjogren's disease presented to ED escorted by police for suicidal attempt.  Last night patient presents to the ED with complaints of dizziness.  She reports that she is homeless and having been eating for 4 to 5 days.  She also endorsed having knee pain.  She was evaluated and was provided a knee sleeve for comfort and was getting food and drink.  Labs was obtained and overall reassuring and patient subsequently was discharged.  Patient was found sleeping in the lobby and when police request for patient to leave, she went outside and took a large number of 100 mg capsules of gabapentin in attempt to kill herself.  She also endorsed having suicidal thoughts for the past few days.  She is attributed to her suicidal thoughts due to being homeless for the past 3 weeks.  She denies any homicidal ideation denies any auditory or visual examination.  Patient denies self-medicating with alcohol or drugs.  Right now she endorsed feeling sleepy but otherwise no chest pain abdominal pain back pain  Home Medications Prior to Admission medications   Medication Sig Start Date End Date Taking? Authorizing Provider  amLODipine (NORVASC) 10 MG tablet Take 10 mg by mouth daily. Patient not taking: Reported on 12/04/2022    [provider]  ARIPiprazole (ABILIFY) 5 MG tablet Take 5 mg by mouth daily.    [provider]  busPIRone (BUSPAR) 10 MG tablet Take 10 mg by mouth 2 (two) times daily.    [provider]  gabapentin (NEURONTIN) 100 MG capsule Take 200 mg by mouth  3 (three) times daily.    [provider]  hydrochlorothiazide (HYDRODIURIL) 25 MG tablet Take 25 mg by mouth daily.    [provider]  levETIRAcetam (KEPPRA) 1000 MG tablet Take 1,000 mg by mouth 2 (two) times daily.    [provider]  levothyroxine (SYNTHROID) 88 MCG tablet Take 88 mcg by mouth in the morning.    [provider]  methocarbamol (ROBAXIN) 500 MG tablet Take 1 tablet (500 mg total) by mouth 2 (two) times daily. 12/04/22   Darrick Grinder, PA-C  omeprazole (PRILOSEC) 20 MG capsule Take 20 mg by mouth daily. 09/03/22 12/04/22  [provider]  potassium chloride SA (KLOR-CON M) 20 MEQ tablet Take 1 tablet (20 mEq total) by mouth daily. 12/12/22   Tilden Fossa, MD  venlafaxine XR (EFFEXOR-XR) 75 MG 24 hr capsule Take 150 mg by mouth in the morning.    [provider]      Allergies    Acetaminophen, Lisinopril, Norvasc [amlodipine], Reglan [metoclopramide], and Statins    Review of Systems   Review of Systems  All other systems reviewed and are negative.   Physical Exam Updated Vital Signs BP (!) 141/71   Pulse 67   Temp 98.2 F (36.8 C)   Resp 15   SpO2 100%  Physical Exam Vitals and nursing note reviewed.  Constitutional:      General: She is not in acute distress.    Appearance: She is well-developed. She  is obese.  HENT:     Head: Atraumatic.  Eyes:     Conjunctiva/sclera: Conjunctivae normal.  Pulmonary:     Effort: Pulmonary effort is normal.  Musculoskeletal:     Cervical back: Neck supple.  Skin:    Findings: No rash.  Neurological:     Mental Status: She is alert.     GCS: GCS eye subscore is 4. GCS verbal subscore is 5. GCS motor subscore is 6.     Cranial Nerves: Cranial nerves 2-12 are intact.     Sensory: Sensation is intact.     Motor: Motor function is intact.  Psychiatric:        Mood and Affect: Mood is depressed.        Speech: Speech normal.        Behavior: Behavior is cooperative.         Thought Content: Thought content is not paranoid. Thought content includes suicidal ideation. Thought content does not include homicidal ideation.     ED Results / Procedures / Treatments   Labs (all labs ordered are listed, but only abnormal results are displayed) Labs Reviewed  COMPREHENSIVE METABOLIC PANEL - Abnormal; Notable for the following components:      Result Value   Potassium 3.4 (*)    Glucose, Bld 108 (*)    Calcium 8.5 (*)    All other components within normal limits  SALICYLATE LEVEL - Abnormal; Notable for the following components:   Salicylate Lvl <7.0 (*)    All other components within normal limits  ACETAMINOPHEN LEVEL - Abnormal; Notable for the following components:   Acetaminophen (Tylenol), Serum <10 (*)    All other components within normal limits  ETHANOL  CBC  MAGNESIUM  RAPID URINE DRUG SCREEN, HOSP PERFORMED  ACETAMINOPHEN LEVEL    EKG None ED ECG REPORT   Date: 12/12/2022  Rate: 66  Rhythm: normal sinus rhythm  QRS Axis: normal  Intervals: QT prolonged  ST/T Wave abnormalities: normal  Conduction Disutrbances:none  Narrative Interpretation:   Old EKG Reviewed: unchanged  I have personally reviewed the EKG tracing and agree with the computerized printout as noted.   Radiology DG Hip Unilat W or Wo Pelvis 2-3 Views Left  Result Date: 12/12/2022 CLINICAL DATA:  Fall with knee pain, rule out referred hip injury. EXAM: DG HIP (WITH OR WITHOUT PELVIS) 3V LEFT COMPARISON:  None Available. FINDINGS: There is no evidence of hip fracture or dislocation. There is no evidence of arthropathy or other focal bone abnormality. IMPRESSION: Negative. Electronically Signed   By: Tiburcio Pea M.D.   On: 12/12/2022 04:29   CT Head Wo Contrast  Result Date: 12/12/2022 CLINICAL DATA:  Neuro deficit, acute stroke suspected. Dizziness, fall, possible head injury EXAM: CT HEAD WITHOUT CONTRAST TECHNIQUE: Contiguous axial images were obtained from the  base of the skull through the vertex without intravenous contrast. RADIATION DOSE REDUCTION: This exam was performed according to the departmental dose-optimization program which includes automated exposure control, adjustment of the mA and/or kV according to patient size and/or use of iterative reconstruction technique. COMPARISON:  CT and MRI 11/22/2022 FINDINGS: Brain: No intracranial hemorrhage, mass effect, or evidence of acute infarct. No hydrocephalus. No extra-axial fluid collection. Vascular: No hyperdense vessel or unexpected calcification. Skull: No fracture or focal lesion. Sinuses/Orbits: Mucosal thickening with frothy mucous in the paranasal sinuses. Other: None. IMPRESSION: No acute intracranial abnormality. Electronically Signed   By: Minerva Fester M.D.   On: 12/12/2022 00:21   DG  Knee Complete 4 Views Left  Result Date: 12/11/2022 CLINICAL DATA:  Fall with left knee injury EXAM: LEFT KNEE - COMPLETE 4+ VIEW COMPARISON:  05/24/2022 FINDINGS: Osteoarthritis with substantial tricompartmental spurring and mild articular space narrowing in the medial compartment. Stable small fragmented osteophyte just above the patella. Patellofemoral articular space narrowing. No fracture or definite knee effusion.  No acute bony findings. IMPRESSION: 1. No acute bony findings. 2. Osteoarthritis. Electronically Signed   By: Gaylyn Rong M.D.   On: 12/11/2022 20:46    Procedures Procedures    Medications Ordered in ED Medications  risperiDONE (RISPERDAL M-TABS) disintegrating tablet 2 mg (has no administration in time range)    And  LORazepam (ATIVAN) tablet 1 mg (has no administration in time range)    And  ziprasidone (GEODON) injection 20 mg (has no administration in time range)  ibuprofen (ADVIL) tablet 600 mg (has no administration in time range)  zolpidem (AMBIEN) tablet 5 mg (has no administration in time range)  ondansetron (ZOFRAN) tablet 4 mg (has no administration in time range)   alum & mag hydroxide-simeth (MAALOX/MYLANTA) 200-200-20 MG/5ML suspension 30 mL (has no administration in time range)  nicotine (NICODERM CQ - dosed in mg/24 hours) patch 21 mg (has no administration in time range)  ARIPiprazole (ABILIFY) tablet 5 mg (has no administration in time range)  levETIRAcetam (KEPPRA) tablet 1,000 mg (has no administration in time range)  levothyroxine (SYNTHROID) tablet 88 mcg (has no administration in time range)  venlafaxine XR (EFFEXOR-XR) 24 hr capsule 150 mg (has no administration in time range)    ED Course/ Medical Decision Making/ A&P Clinical Course as of 12/12/22 1514  Sat Dec 12, 2022  1459 Suicide attempt. See yesterday for fall. Discharged. Took gabapentin. - Medically clear at 6 hours [KH]    Clinical Course User Index [KH] Claretha Cooper, DO                                 Medical Decision Making Amount and/or Complexity of Data Reviewed Labs: ordered.  Risk OTC drugs. Prescription drug management.   BP (!) 141/71   Pulse 67   Temp 98.2 F (36.8 C)   Resp 15   SpO2 100%   30:89 AM  52 year old female significant history of anxiety, depression, hypertension, Sjogren's disease presented to ED escorted by police for suicidal attempt.  Last night patient presents to the ED with complaints of dizziness.  She reports that she is homeless and having been eating for 4 to 5 days.  She also endorsed having knee pain.  She was evaluated and was provided a knee sleeve for comfort and was getting food and drink.  Labs was obtained and overall reassuring and patient subsequently was discharged.  Patient was found sleeping in the lobby and when police request for patient to leave, she went outside and took a large number of 100 mg capsules of gabapentin in attempt to kill herself.  She also endorsed having suicidal thoughts for the past few days.  She is attributed to her suicidal thoughts due to being homeless for the past 3 weeks.  She denies any  homicidal ideation denies any auditory or visual examination.  Patient denies self-medicating with alcohol or drugs.  Right now she endorsed feeling sleepy but otherwise no chest pain abdominal pain back pain  On exam, patient is laying on the left lateral decubitus position, appears depressed but in no acute  discomfort.  She is easily arousable and speaking in complete sentences.  Heart with normal rate and rhythm, lungs are clear to auscultation bilaterally abdomen is soft nontender she is moving all 4 extremities.  Poison control was contacted who gave recommendations.  Patient is to be monitored for the next 6 hours.  Obtain EKG, CBC, mag level, Tylenol level will need to be repeated in 4 hours as well as salicylate level and ethanol and UDS.  2:28 PM -Labs ordered, independently viewed and interpreted by me.  Labs remarkable for reassuring labs -The patient was maintained on a cardiac monitor.  I personally viewed and interpreted the cardiac monitored which showed an underlying rhythm of: NSR -Imaging not considered at this time -This patient presents to the ED for concern of suicidal attempt, this involves an extensive number of treatment options, and is a complaint that carries with it a high risk of complications and morbidity.  The differential diagnosis includes depression, psychosis, secondary gain, substance induced mood disorder, social stressor -Co morbidities that complicate the patient evaluation includes anxiety, depression, unhoused -Treatment includes supportive care -Reevaluation of the patient after these medicines showed that the patient stayed the same -PCP office notes or outside notes reviewed -Discussion with TTS for psych eval -Escalation to admission/observation considered: dispo pending psych eval.   3:14 PM Patient has been monitored for the past 6 hours.  She is medically cleared and can be assessed further by psychiatry.        Final Clinical Impression(s) /  ED Diagnoses Final diagnoses:  Suicide attempt [facility])    Rx / DC Orders ED Discharge Orders     None         Fayrene Helper, PA-C 12/12/22 1514    Arby Barrette, MD 12/12/22 1626

## 2022-12-12 NOTE — ED Notes (Signed)
Pt refused nicotine patch, stated "I am not a smoker, I do not need it".

## 2022-12-12 NOTE — Consult Note (Signed)
Attempted to see patient for psychiatric assessment via tts cart.  Patient appears asleep and, and unable to participate in assessment.  2 nursing members are at the bedside, verbally encouraging patient to participate, also observed gently touching her shoulder in an attempt to wake her.  Patient heard mumbling "no."  Per chart review, patient has been refusing care since arrival.  Vital signs completed at 1330 were stable, and patient currently no apparent distress noted.     As she was referred to psychiatry for suicide attempt via ingestion, made several unsuccessful attempt, encouraging her participation.  Will re-attempt when patient is awake.

## 2022-12-12 NOTE — ED Notes (Signed)
Called staffing [facility], they advised that they would have a sitter at 11 for patient.

## 2022-12-12 NOTE — ED Notes (Signed)
IVC paperwork done. IVCd: 12/12/22 EXP: 12/19/22; Paperwork in in orange zone

## 2022-12-12 NOTE — ED Triage Notes (Signed)
Pt was seen and recently discharged from the ED. She was found sleeping in the lobby, after leaving she went outside and states she took a large number of 100mg  capsules of gabapentin. Pt reports this was an attempt of suicide. Pt states she has had Suicidal thoughts for the past few days. Pt is drowsy but is AxOx4.

## 2022-12-12 NOTE — Consult Note (Incomplete)
Telepsych Consultation   Reason for Consult:  *** Referring Physician:  *** Location of Patient:  Location of Provider: { [facility] Provider Location:30414014}  Patient Identification: Alisha Webster MRN:  [id number] Principal Diagnosis: <principal problem not specified> Diagnosis:  Active Problems:   * No active hospital problems. *   Total Time spent with patient: {Time; 15 min - 8 hours:17441}  Subjective:   Alisha Webster is a 52 y.o. female patient admitted with ***. Per RN Triage Notes "Pt was seen and recently discharged from the ED. She was found sleeping in the lobby, after leaving she went outside and states she took a large number of 100mg  capsules of gabapentin. Pt reports this was an attempt of suicide. Pt states she has had Suicidal thoughts for the past few days. Pt is drowsy but is AxOx4. "  HPI:    Alisha Webster, 52 y.o., female patient presented to ***.  Patient seen via telepsych by this provider; chart reviewed and consulted with Dr. Lucianne Muss on 12/12/22.  On evaluation Alisha Webster reports     Pt has hx for MDD and was admitted to [facility] from 11/23/2022 through 12/02/2022 for suicidal ideation with a plan and intent for self harm.  She was discharged to follow up with [facility] on 12/09/2022 for therapy and medication management.    During evaluation Alisha Webster is ***(position); ***he/she is alert/oriented x 4; calm/cooperative; and mood congruent with affect.  Patient is speaking in a clear tone at moderate volume, and normal pace; with good eye contact.  ***His/Her thought process is coherent and relevant; There is no indication that ***he/she is currently responding to internal/external stimuli or experiencing delusional thought content.  Patient denies suicidal/self-harm/homicidal ideation, psychosis, and paranoia.  Patient has remained calm throughout assessment and has answered questions appropriately.   Per ED Provider Admission Assessment  12/12/2022@0946 : Chief Complaint  Patient presents with  . Suicide Attempt      Alisha Webster is a 52 y.o. female.   The history is provided by the patient, the police and medical records. No language interpreter was used.       52 year old female significant history of anxiety, depression, hypertension, Sjogren's disease presented to ED escorted by police for suicidal attempt.  Last night patient presents to the ED with complaints of dizziness.  She reports that she is homeless and having been eating for 4 to 5 days.  She also endorsed having knee pain.  She was evaluated and was provided a knee sleeve for comfort and was getting food and drink.  Labs was obtained and overall reassuring and patient subsequently was discharged.  Patient was found sleeping in the lobby and when police request for patient to leave, she went outside and took a large number of 100 mg capsules of gabapentin in attempt to kill herself.  She also endorsed having suicidal thoughts for the past few days.  She is attributed to her suicidal thoughts due to being homeless for the past 3 weeks.  She denies any homicidal ideation denies any auditory or visual examination.  Patient denies self-medicating with alcohol or drugs.  Right now she endorsed feeling sleepy but otherwise no chest pain abdominal pain back pain   Past Psychiatric History:  Per chart review Psychiatric Hospitalization hx:  11/12/22, [facility]: Severe depression with SI  08/09/2022: [facility]: Admitted for overdose required life-supporting treatment. Patient denies recall of overdose or intentions.   Risk to Self:   Risk to Others:  Prior Inpatient Therapy:   Prior Outpatient Therapy:    Past Medical History:  Past Medical History:  Diagnosis Date  . Anxiety   . Back pain   . Depression   . Hyperlipidemia   . Hypertension     Past Surgical History:  Procedure Laterality Date  . CHOLECYSTECTOMY     for gallstones  .  Tonisillectomy    . TOTAL ABDOMINAL HYSTERECTOMY     One ovary left   Family History: History reviewed. No pertinent family history. Family Psychiatric  History: *** Social History:  Social History   Substance and Sexual Activity  Alcohol Use No     Social History   Substance and Sexual Activity  Drug Use No    Social History   Socioeconomic History  . Marital status: Divorced    Spouse name: Not on file  . Number of children: Not on file  . Years of education: Not on file  . Highest education level: Not on file  Occupational History  . Not on file  Tobacco Use  . Smoking status: Never  . Smokeless tobacco: Never  Vaping Use  . Vaping status: Not on file  Substance and Sexual Activity  . Alcohol use: No  . Drug use: No  . Sexual activity: Not on file  Other Topics Concern  . Not on file  Social History Narrative  . Not on file   Social Determinants of Health   Financial Resource Strain: High Risk (07/11/2018)   Received from [facility] visits prior to 05/30/2022.   Overall Physicist, medical Strain (CARDIA)   . Difficulty of Paying Living Expenses: Hard  Food Insecurity: Food Insecurity Present (11/23/2022)   Hunger Vital Sign   . Worried About Programme researcher, broadcasting/film/video in the Last Year: Sometimes true   . Ran Out of Food in the Last Year: Sometimes true  Transportation Needs: Patient Declined (12/02/2022)   PRAPARE - Transportation   . Lack of Transportation (Medical): Patient declined   . Lack of Transportation (Non-Medical): Patient declined  Recent Concern: Transportation Needs - Unmet Transportation Needs (12/02/2022)   PRAPARE - Transportation   . Lack of Transportation (Medical): Yes   . Lack of Transportation (Non-Medical): Yes  Physical Activity: Not on file  Stress: Not on file  Social Connections: Unknown (08/05/2021)   [facility]   Social Network   . Social Network: Not on file   Additional Social History:     Allergies:   Allergies  Allergen Reactions  . Acetaminophen Other (See Comments)    "Liver disease"  . Lisinopril Other (See Comments)    Makes kidneys shut down  . Norvasc [Amlodipine] Other (See Comments)    Leg swelling  . Reglan [Metoclopramide] Other (See Comments)    Felt hot, neck flushed  . Statins Other (See Comments)    Severe muscle pain    Labs:  Results for orders placed or performed during the hospital encounter of 12/12/22 (from the past 48 hour(s))  Comprehensive metabolic panel     Status: Abnormal   Collection Time: 12/12/22 10:15 AM  Result Value Ref Range   Sodium 139 135 - 145 mmol/L   Potassium 3.4 (L) 3.5 - 5.1 mmol/L   Chloride 102 98 - 111 mmol/L   CO2 28 22 - 32 mmol/L   Glucose, Bld 108 (H) 70 - 99 mg/dL    Comment: Glucose reference range applies only to samples taken after fasting for at  least 8 hours.   BUN 14 6 - 20 mg/dL   Creatinine, Ser 1.61 0.44 - 1.00 mg/dL   Calcium 8.5 (L) 8.9 - 10.3 mg/dL   Total Protein 6.6 6.5 - 8.1 g/dL   Albumin 3.6 3.5 - 5.0 g/dL   AST 26 15 - 41 U/L   ALT 25 0 - 44 U/L   Alkaline Phosphatase 96 38 - 126 U/L   Total Bilirubin 0.7 0.3 - 1.2 mg/dL   GFR, Estimated >09 >60 mL/min    Comment: (NOTE) Calculated using the CKD-EPI Creatinine Equation (2021)    Anion gap 9 5 - 15    Comment: Performed at [facility], [address], [place], [place] [address]  Ethanol     Status: None   Collection Time: 12/12/22 10:15 AM  Result Value Ref Range   Alcohol, Ethyl (B) <10 <10 mg/dL    Comment: (NOTE) Lowest detectable limit for serum alcohol is 10 mg/dL.  For medical purposes only. Performed at [facility], [address] [place], [place] [address]   Salicylate level     Status: Abnormal   Collection Time: 12/12/22 10:15 AM  Result Value Ref Range   Salicylate Lvl <7.0 (L) 7.0 - 30.0 mg/dL    Comment: Performed at [facility], [address], [place], [place] [address]  Acetaminophen  level     Status: Abnormal   Collection Time: 12/12/22 10:15 AM  Result Value Ref Range   Acetaminophen (Tylenol), Serum <10 (L) 10 - 30 ug/mL    Comment: (NOTE) Therapeutic concentrations vary significantly. A range of 10-30 ug/mL  may be an effective concentration for many patients. However, some  are best treated at concentrations outside of this range. Acetaminophen concentrations >150 ug/mL at 4 hours after ingestion  and >50 ug/mL at 12 hours after ingestion are often associated with  toxic reactions.  Performed at [facility], [address], [place], [place] [address]   cbc     Status: None   Collection Time: 12/12/22 10:15 AM  Result Value Ref Range   WBC 6.2 4.0 - 10.5 K/uL   RBC 4.55 3.87 - 5.11 MIL/uL   Hemoglobin 13.4 12.0 - 15.0 g/dL   HCT 08.6 57.8 - 46.9 %   MCV 88.4 80.0 - 100.0 fL   MCH 29.5 26.0 - 34.0 pg   MCHC 33.3 30.0 - 36.0 g/dL   RDW 62.9 52.8 - 41.3 %   Platelets 274 150 - 400 K/uL   nRBC 0.0 0.0 - 0.2 %    Comment: Performed at [facility], [address], [place], [place] [address]  Magnesium     Status: None   Collection Time: 12/12/22 10:15 AM  Result Value Ref Range   Magnesium 2.1 1.7 - 2.4 mg/dL    Comment: Performed at [facility], [address], [place], [place] [address]    Medications:  Current Facility-Administered Medications  Medication Dose Route Frequency Provider Last Rate Last Admin  . alum & mag hydroxide-simeth (MAALOX/MYLANTA) 200-200-20 MG/5ML suspension 30 mL  30 mL Oral Q6H PRN Fayrene Helper, PA-C      . ARIPiprazole (ABILIFY) tablet 5 mg  5 mg Oral Daily Fayrene Helper, PA-C   5 mg at 12/12/22 1507  . ibuprofen (ADVIL) tablet 600 mg  600 mg Oral Q8H PRN Fayrene Helper, PA-C      . levETIRAcetam (KEPPRA) tablet 1,000 mg  1,000 mg Oral  BID Fayrene Helper, PA-C   1,000 mg at 12/12/22 1507  . [START ON 12/13/2022] levothyroxine (SYNTHROID) tablet 88 mcg  88 mcg Oral Q0600 Arby Barrette, MD      . risperiDONE  (RISPERDAL M-TABS) disintegrating tablet 2 mg  2 mg Oral Q8H PRN Fayrene Helper, PA-C       And  . LORazepam (ATIVAN) tablet 1 mg  1 mg Oral PRN Fayrene Helper, PA-C       And  . ziprasidone (GEODON) injection 20 mg  20 mg Intramuscular PRN Fayrene Helper, PA-C      . nicotine (NICODERM CQ - dosed in mg/24 hours) patch 21 mg  21 mg Transdermal Daily Fayrene Helper, PA-C      . ondansetron [facility]) tablet 4 mg  4 mg Oral Q8H PRN Fayrene Helper, PA-C      . [START ON 12/13/2022] venlafaxine XR (EFFEXOR-XR) 24 hr capsule 150 mg  150 mg Oral Q breakfast Pfeiffer, Marcy, MD      . zolpidem (AMBIEN) tablet 5 mg  5 mg Oral QHS PRN Fayrene Helper, PA-C       Current Outpatient Medications  Medication Sig Dispense Refill  . amLODipine (NORVASC) 10 MG tablet Take 10 mg by mouth daily. (Patient not taking: Reported on 12/04/2022)    . ARIPiprazole (ABILIFY) 5 MG tablet Take 5 mg by mouth daily.    . busPIRone (BUSPAR) 10 MG tablet Take 10 mg by mouth 2 (two) times daily.    Marland Kitchen gabapentin (NEURONTIN) 100 MG capsule Take 200 mg by mouth 3 (three) times daily.    . hydrochlorothiazide (HYDRODIURIL) 25 MG tablet Take 25 mg by mouth daily.    Marland Kitchen levETIRAcetam (KEPPRA) 1000 MG tablet Take 1,000 mg by mouth 2 (two) times daily.    Marland Kitchen levothyroxine (SYNTHROID) 88 MCG tablet Take 88 mcg by mouth in the morning.    . methocarbamol (ROBAXIN) 500 MG tablet Take 1 tablet (500 mg total) by mouth 2 (two) times daily. 20 tablet 0  . omeprazole (PRILOSEC) 20 MG capsule Take 20 mg by mouth daily.    . potassium chloride SA (KLOR-CON M) 20 MEQ tablet Take 1 tablet (20 mEq total) by mouth daily. 7 tablet 0  . venlafaxine XR (EFFEXOR-XR) 75 MG 24 hr capsule Take 150 mg by mouth in the morning.      Musculoskeletal: Strength & Muscle Tone: {desc; muscle tone:32375} Gait & Station: {PE GAIT ED NWGN:56213} Patient leans: {Patient Leans:21022755}          Psychiatric Specialty Exam:  Presentation  General Appearance:  Appropriate for  Environment; Disheveled; Casual  Eye Contact: Fair  Speech: Clear and Coherent  Speech Volume: Decreased  Handedness: Right   Mood and Affect  Mood: Anxious; Depressed  Affect: Blunt; Restricted   Thought Process  Thought Processes: Coherent  Descriptions of Associations:Intact  Orientation:Full (Time, Place and Person)  Thought Content:Perseveration; Rumination  History of Schizophrenia/Schizoaffective disorder:No data recorded Duration of Psychotic Symptoms:No data recorded Hallucinations:No data recorded Ideas of Reference:None  Suicidal Thoughts:No data recorded Homicidal Thoughts:No data recorded  Sensorium  Memory: Immediate Fair; Remote Fair; Recent Fair  Judgment: Fair  Insight: Fair   Art therapist  Concentration: Fair  Attention Span: Fair  Recall: [employer] of Knowledge: Fair  Language: Fair   Psychomotor Activity  Psychomotor Activity:No data recorded  Assets  Assets: Desire for Improvement; Resilience   Sleep  Sleep:No data recorded   Physical Exam: Physical Exam ROS Blood pressure 127/71, pulse  72, temperature 98.2 F (36.8 C), resp. rate 17, SpO2 99%. There is no height or weight on file to calculate BMI.  Treatment Plan Summary: {CHL [facility] MD [place] ZOXW:960454098}  Disposition: {CHL [facility] Consult Plan:20772}  This service was provided via telemedicine using a 2-way, interactive audio and video technology.  Spoke with Dr. ***; informed of above recommendation and disposition  Names of all persons participating in this telemedicine service and their role in this encounter. Name: *** Role: ***  Name: *** Role: ***  Name: *** Role: ***  Name: *** Role: ***    Chales Abrahams, NP 12/12/2022 3:14 PM

## 2022-12-12 NOTE — ED Notes (Signed)
Pt is refusing meds and vital signs.

## 2022-12-12 NOTE — ED Notes (Signed)
Spoke with poison control at request of provider, they advised can cause respiratory/CNS depression. Will need obs for 6 hours. Will need 4 hour acetaminophen @ 1400. Add mag, add EKG. IVF as indicated, can cause hypotension. Needs cardiac monitoring.

## 2022-12-12 NOTE — ED Notes (Signed)
Pt refused to take her Keppra and potassium

## 2022-12-12 NOTE — ED Notes (Signed)
Patient refusing knee brace at this time, states she is not leaving the bed until she talks to the Child psychotherapist, RN explain the social worker may talk with her in the lobby, they patient refuses to leave again, security called.

## 2022-12-13 LAB — ACETAMINOPHEN LEVEL: Acetaminophen (Tylenol), Serum: <10 ug/mL — ABNORMAL LOW (ref 10–30)

## 2022-12-13 MED ORDER — LORAZEPAM 1 MG PO TABS
1.0000 mg | ORAL_TABLET | Freq: Once | ORAL | Status: AC | PRN
  Administered 2022-12-13: 1 mg via ORAL
  Filled 2022-12-13: qty 1

## 2022-12-13 MED ORDER — OXYCODONE-ACETAMINOPHEN 5-325 MG PO TABS
1.0000 | ORAL_TABLET | Freq: Once | ORAL | Status: AC
  Administered 2022-12-13: 1 via ORAL
  Filled 2022-12-13: qty 1

## 2022-12-13 NOTE — ED Notes (Signed)
Pt resting comfortably. Rise and fall of chest noted.

## 2022-12-13 NOTE — ED Notes (Signed)
Pt not allowing for medical care. MD Cardama made aware. IM Geodon PRN administered at this time.

## 2022-12-13 NOTE — ED Notes (Signed)
Pt refused vitals to be taken. Pt "stated I'm good"

## 2022-12-13 NOTE — ED Notes (Signed)
Pt remains here under IVC currently in hallway sleeping. Was given Geodon prior to this Rns arrival. Per staffing, no sitter available to relieve sitter going home at this time. Charge RN [employer] informed.

## 2022-12-13 NOTE — Consult Note (Signed)
Attempted to see patient for psychiatric evaluation after she allegedly overdoses on gabapentin.  Patient seen laying in prone position on the bed. When greeted by this writer she does not respond and does turn over to answer questions.  Prior to starting assessment was informed that patient said she would not turn over to participate in tts assessment.

## 2022-12-13 NOTE — ED Notes (Signed)
Best Maurine Minister [phone] would like an update asap

## 2022-12-13 NOTE — ED Notes (Signed)
Attempted medical care once again, pt elbowing at staff stating "nope". Will not allow for vitals and repeat acetaminophen level.

## 2022-12-13 NOTE — ED Notes (Signed)
Ophelia Shoulder nurse practitioner call to TTS patient and Rn made the patient aware. Per patient " I am not turning over"

## 2022-12-13 NOTE — Progress Notes (Signed)
Patient has been denied by [facility] due to no appropriate beds available. Patient meets BH inpatient criteria per Ophelia Shoulder, NP Patient has been faxed out to the following facilities:   [facility]  [address] [place] [place] [address] [phone]  [facility]  [address] [place] [address] [phone]  [facility]  [address] [place] [address] [phone]  [facility]  [address] [place] [address] [phone]  [facility]  [address], [place] [address] [phone]  [facility]  [address] [place] [address] [phone]  [facility]  [facility], [place] [phone]  [facility]  [address] [place], [place] [address] [phone]  [facility]  [address] [place], [place] [address] [phone]  [facility]  [address] [place] [address] [phone]  [facility]  [address], [place] [address] [phone]  [facility]  [address], [place] [address] [phone]  [facility] [address]  [address], [place] [address] [phone]  [facility]  [address] [place] [address] [phone]   Damita Dunnings, MSW, LCSW-A  1:34 PM 12/13/2022

## 2022-12-13 NOTE — ED Notes (Signed)
Pt hitting at staff, still not allowing for medical care.

## 2022-12-13 NOTE — ED Notes (Signed)
Pt is making verbal threats and acts of violence to staff.

## 2022-12-13 NOTE — ED Notes (Signed)
Pt now cooperative to draw lab and obtain vitals.

## 2022-12-14 DIAGNOSIS — F332 Major depressive disorder, recurrent severe without psychotic features: Secondary | ICD-10-CM

## 2022-12-14 LAB — RAPID URINE DRUG SCREEN, HOSP PERFORMED
Amphetamines: POSITIVE — AB
Barbiturates: NOT DETECTED
Benzodiazepines: POSITIVE — AB
Cocaine: NOT DETECTED
Opiates: NOT DETECTED
Tetrahydrocannabinol: NOT DETECTED

## 2022-12-14 MED ORDER — LORAZEPAM 1 MG PO TABS
1.0000 mg | ORAL_TABLET | Freq: Four times a day (QID) | ORAL | Status: DC | PRN
  Administered 2022-12-14: 1 mg via ORAL
  Filled 2022-12-14: qty 1

## 2022-12-14 MED ORDER — ACETAMINOPHEN 325 MG PO TABS: 650.0000 mg | ORAL_TABLET | Freq: Four times a day (QID) | ORAL | Status: DC | PRN
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
## 2022-12-14 NOTE — ED Notes (Signed)
Notified provider that pt asking for "something for my nerves". Pt was verbally aggressive w/ this RN earlier d/t pt not getting what she wants right when she wants it. This RN had to explain that I would look at her orders as soon as able.

## 2022-12-14 NOTE — Progress Notes (Signed)
LCSW Progress Note  [id number]   Alisha Webster  12/14/2022  11:00 AM  Description:   Inpatient Psychiatric Referral  Patient was recommended inpatient per Eligha Bridegroom, NP. There are no available beds at [facility], per [facility] Rona Ravens, RN. Patient was referred to the following out of network facilities:   Destination  Service Provider Address Phone Fax  [facility]  [address] [place]., [place] [address] [phone]  [facility]  [address] [place] [address] [phone]  [facility]  [address] [place] [address] [phone]  [facility]  [address], [place] [address] [phone]  [facility]  [address] [place] [address] [phone]  [facility]  [address], [place] [address] [phone]  [facility] Patient Placement  [facility], [place] [phone]  [facility]  [address] [place], [place] [address] [phone]  [facility]  [address] [place], [place] [address] [phone]  [facility]  [address] [place] [address] [phone]  [facility]  [address], [place] [address] [phone]  [facility]  [address] [place] [address] [place] [address] [phone]  [facility]  [address] [place] [address] [phone]  [facility]  [address] [place] [address] [phone]  [facility]  [address] [place] [address]  [phone]  [facility]  [place] [address] [phone]  [facility]  [address] [place], [place] [address] [phone]  [facility]  [address] [place] [address] [phone]  [facility]  [place] [phone]  [facility]  [address] [place], [place] [phone]  [facility]  [address] [place] [address] [phone]  [facility]  [address], [place] [address] [phone]  [facility]  [address] [place] [address] [phone]  [facility] Inpatient EFAX  [place] [phone]    Situation ongoing, CSW to continue following and update chart as more information becomes available.      Cathie Beams, LCSW  12/14/2022 11:00 AM

## 2022-12-14 NOTE — ED Notes (Signed)
Notified MD about pt BP and that pt was never given her hydrochlorothiazide while here.

## 2022-12-14 NOTE — ED Notes (Signed)
Per Lawsing MD, pt's BP is fine for D/C.

## 2022-12-14 NOTE — ED Notes (Signed)
Rescind paperwork completed by the doctor.  The rescind document has been scanned into the patient's chart, a copy placed in the red folder and a copy sent to medical records.

## 2022-12-14 NOTE — Discharge Instructions (Signed)
Homeless Shelter List:  Health and safety inspector [facility] Tse Bonito) [address] [place], [place]  Phone: [phone]  [facility] [address], [place], [place] [address] Phone: [phone]  [facility] (Women only) [address] [place], [place] [address] Phone: [phone]  [facility] [address], [place] [address] Phone: [phone]  [facility]: [phone]. [address] [place], [place] [address] Phone: [phone]  [facility]  [address] [place], [place] [address] (Check in at 6:00PM for placement at a local shelter) Phone: [phone] [facility] Interactive Resource Center [facility]) Monday - Friday 8am - 3pm          Sat & Sun 8am - 2pm [address] [place], [place] [address]   [phone]     www.interactiveresourcecenter.org IRC offers among other critical resources: showers, laundry, barbershop, phone bank, mailroom, computer lab, medical clinic, gardens and a bike maintenance area.   AREA SHELTERS  [place] [facility] [employer]  (Men & women) [address] [employer] [place] [phone]  [facility] (Men/women/families) [address] [place] [phone]   [facility] (Families with children) [phone] [address]  [place] [phone]   [facility] (Domestic Violence Shelter) [address]  [place] [phone]   [facility] (Children ages 83-17) [address]  [place] [phone]   [facility]   (Women & children) [address] [place] [phone]   [facility] (Women/substance abuse) [address]  [place] [phone]   [facility] (Men) [address]  [place] [phone]   [facility] (Men) [address]  [place] [phone]  [employer] (Women) [phone]  [place]  [phone]   [facility] (Single women & women with children) [address]  [place] [phone]  [facility] (Men/women/families) [address] [phone]    [facility]   (Domestic Violence shelter) [address]   [phone]   [facility] (Men & women) [address]  [place] [phone]  [facility] (Men) [address]  [employer] [phone]   [facility] (Men) [address]  [place] [phone]   Holiday representative (Single women & families) [address]  [place] [phone]  Crisis [facility]. (Men/women & families) [address]  [place] [phone]    If you are at risk of losing your housing (throughout [facility]) call the Housing Hotline at 303-563-3795. You may also contact 2-1-1, a FREE service of the [address] that provides information about many resources including housing. Dial 211, or visit online at PooledIncome.pl. [facility]:  [facility] [place], contact Janelle Floor [phone] (men only)                          Huey Romans in [place]:  90 day homeless program for women and men;                             contact Rev. Chambers [phone]  [facility]:  [facility]:  men/women/children [phone]  [facility]:  Shelter in [place], Renato Gails [place] [phone]                       [employer] in [place],  [place] [phone]   [facility]:  Crisis Council for Abused Women, [phone]; (women and children)  [facility]:  [employer], men/women/children; [phone]                           [facility] in [place], [phone]; substance abuse halfway house for men              [facility]; 4 bedroom house in [facility] for homeless women, contact Jenne Campus [phone]               [facility] in [place], [phone] (women and  children)               Friend to Friend, for abused women and children, 24 hour crisis line, [phone], Jamison Oka  [facility], halfway house for women, [facility] [place] [facility], [phone]  [facility]:  [facility], [phone]; open Mon-Thurs from TKZ60 - March 15 when temp is below 32 degrees                              [facility]; Alwyn [place], [phone]; cell [phone]; open 24/7  [facility]:  [facility] - Rev Ladona Ridgel - [phone]  [place]:  transitional housing for women and children; Arminda Resides [phone] Resource Center  Hours Monday - Friday: Services: 8:00AM - 3:00PM Offices: 8:00AM - 5:00PM  Physical Address [address] [place], [place] [address]   Please use this address for [facility] Mailing Address [address] [address] [place], [place] [address]  The [facility] helps people reconnect This is a safe place to rest, take care of basic needs and access the services and community that make all the difference. Our guests come to the [facility] to take a class, do laundry, meet with a case manager or to get their mail. Sometimes they just need to sit in our dayroom and enjoy a conversation.  Here you will find everything from shower facilities to a computer lab, a mail room, classrooms and meeting spaces.  The IRC helps people reconnect with their own lives and with the community at large.  A caring community setting One of the most exciting aspects of the IRC is that so many individuals and organizations in the community are a part of the everyday experience. Whether it's a hair stylist or law firm offering services right in-house, our partners make the [facility] a truly interactive resource center where services are brought to our guests. The IRC brings together a comprehensive community of talented people who not only want to help solve problems, but also to be a part of our guests'  lives.  Integrated Care We take a person-centered approach to assistance that includes: Case management Geneticist, molecular Medical clinic Mental health nurse Referrals  Fundamental Services We start with necessities: Midwife and Armed forces operational officer addresses and mailboxes Replacement IDs Onsite barbershop Storage lockers [employer] Flag winter warming center  Self-Sufficiency We connect our guests with: Skilled trade classes Job skills classes Resume and jobs application assistance Interview training GED Producer, television/film/video literacy COUNSELING AGENCIES in [place] (Accepting [facility])  [facility] [address]  [phone]  [facility] Counseling [address] [place].    [phone]  [facility] Family Therapists [address] [place].    [phone]   Habla Espaol/Interprete  [facility] of  the [place] [address]    [phone]  [facility] [address]  "[place]"    [phone]   [facility] [address] [place].     [phone] The Social [facility]otional [facility] (SEL) [address] [place].  [phone]  Psychiatric services/servicios psiquiatricos  [facility] [address] [place]   [phone] [facility] [address]     [phone]  [facility] [address]      [phone]   Habla Espaol/Interprete & Psychiatric services/servicios psiquiatricos  [facility] [address]  [phone]  [facility] [address] [place].     [phone]  [facility] Care Services [address]    [phone] Psychotherapeutic Services [address] (51yo & over only)     [phone]    [phone]  Provides information on mental health, intellectual/developmental disabilities &  substance abuse services in [facility]: Outpatient psychiatric Services:   Please see the walk in hours listed below.  Medication Management New Patient needing Medication Management Walk-in, and Existing Patients needing to see a provider for management coming as a walk in   Monday thru Friday 8:00 AM first come first serve until slots are full.  Recommend being there by 7:15 AM to ensure a slot is open.  Therapy New Patient Therapy Intake and Existing Patients needing to see therapist coming in as a walk in.   Monday, Wednesday, and Thursday morning at 8:00 am first come first serve.  Recommend being there by 7:15 AM to ensure a slot is open.    Every 1st, 2nd, and 3rd Friday at 1:00 PM first come first serve until slots are full.  Will still need to come in that morning at 7:15 AM to get registered for an afternoon slot.  For all walk-ins we ask that you arrive by 7:15 am because patients will be seen in there order of arrival (FIRST COME FIRST SERVE) Availability is limited, therefore you may not be seen on the same day that you walk in if all slots are full.    Our goal is to serve and meet the needs of our community to the best of our ability.

## 2022-12-14 NOTE — ED Notes (Signed)
IVC is current

## 2022-12-14 NOTE — ED Notes (Signed)
Reviewed discharge instructions with patient. Follow-up care and shelter resources reviewed. Patient verbalized understanding. Patient A&Ox4, VSS, and ambulatory with steady gait upon discharge. Pt's ride will be here after 1500. Security notified. Pt offered [place] sandwich, crackers and peanut butter to go. Pt refused.

## 2022-12-14 NOTE — ED Provider Notes (Addendum)
Emergency Medicine Observation Re-evaluation Note  Alisha Webster is a 52 y.o. female, seen on rounds today.  Pt initially presented to the ED for complaints of Suicide Attempt Currently, the patient is resting in bed.  Physical Exam  BP (!) 142/79 (BP Location: Left Arm)   Pulse 76   Temp 97.7 F (36.5 C) (Oral)   Resp 19   Ht 5\' 4"  (1.626 m)   Wt 77.1 kg   SpO2 99%   BMI 29.18 kg/m  Physical Exam General: NAD Cardiac: well perfused Lungs: even and unlabored breathing Psych: no agitation  ED Course / MDM  EKG:EKG Interpretation Date/Time:  Saturday December 12 2022 10:12:50 EDT Ventricular Rate:  66 PR Interval:  154 QRS Duration:  78 QT Interval:  462 QTC Calculation: 484 R Axis:   62  Text Interpretation: Normal sinus rhythm Prolonged QT Confirmed by Cathren Laine ([id number]) on 12/12/2022 7:40:47 PM  I have reviewed the labs performed to date as well as medications administered while in observation.  Recent changes in the last 24 hours include initial attempts by psychiatry to evaluate the patient overnight were unsuccessful, pending re-eval this AM.  Plan  Current plan is for psychiatry for dispo.    1100 Pt cleared by psychiatry per Eligha Bridegroom, NP. Resources provided. IVC was reversed.    Ernie Avena, MD 12/14/22 1115

## 2023-01-21 NOTE — Plan of Care (Signed)
[facility] Tonsillectomy/Adenoidectomy, Postoperative PEDS care plan entered in error.

## 2023-04-05 ENCOUNTER — Emergency Department (HOSPITAL_COMMUNITY)
Admission: EM | Admit: 2023-04-05 | Discharge: 2023-04-05 | Disposition: A | Discharge status: Home / Self Care | Payer: MEDICAID | Attending: Emergency Medicine | Admitting: Emergency Medicine

## 2023-04-05 ENCOUNTER — Other Ambulatory Visit: Payer: Self-pay

## 2023-04-05 ENCOUNTER — Emergency Department (HOSPITAL_COMMUNITY): Payer: MEDICAID

## 2023-04-05 DIAGNOSIS — H73893 Other specified disorders of tympanic membrane, bilateral: Secondary | ICD-10-CM | POA: Insufficient documentation

## 2023-04-05 DIAGNOSIS — Z79899 Other long term (current) drug therapy: Secondary | ICD-10-CM | POA: Diagnosis not present

## 2023-04-05 DIAGNOSIS — R0789 Other chest pain: Secondary | ICD-10-CM | POA: Diagnosis present

## 2023-04-05 DIAGNOSIS — I10 Essential (primary) hypertension: Secondary | ICD-10-CM | POA: Insufficient documentation

## 2023-04-05 LAB — BASIC METABOLIC PANEL
Anion gap: 11 (ref 5–15)
BUN: 25 mg/dL — ABNORMAL HIGH (ref 6–20)
CO2: 22 mmol/L (ref 22–32)
Calcium: 9 mg/dL (ref 8.9–10.3)
Chloride: 104 mmol/L (ref 98–111)
Creatinine, Ser: 0.85 mg/dL (ref 0.44–1.00)
GFR, Estimated: >60 mL/min (ref >60)
Glucose, Bld: 110 mg/dL — ABNORMAL HIGH (ref 70–99)
Potassium: 3.7 mmol/L (ref 3.5–5.1)
Sodium: 137 mmol/L (ref 135–145)

## 2023-04-05 LAB — BRAIN NATRIURETIC PEPTIDE: B Natriuretic Peptide: 68.1 pg/mL (ref 0.0–100.0)

## 2023-04-05 LAB — CBC
HCT: 39.2 % (ref 36.0–46.0)
Hemoglobin: 13.4 g/dL (ref 12.0–15.0)
MCH: 30.5 pg (ref 26.0–34.0)
MCHC: 34.2 g/dL (ref 30.0–36.0)
MCV: 89.1 fL (ref 80.0–100.0)
Platelets: 378 10*3/uL (ref 150–400)
RBC: 4.4 MIL/uL (ref 3.87–5.11)
RDW: 12.3 % (ref 11.5–15.5)
WBC: 9.3 10*3/uL (ref 4.0–10.5)
nRBC: 0 % (ref 0.0–0.2)

## 2023-04-05 LAB — MAGNESIUM: Magnesium: 1.8 mg/dL (ref 1.7–2.4)

## 2023-04-05 LAB — TROPONIN I (HIGH SENSITIVITY)
Troponin I (High Sensitivity): 5 ng/L (ref <18)
Troponin I (High Sensitivity): 5 ng/L (ref <18)

## 2023-04-05 MED ORDER — CIPROFLOXACIN-DEXAMETHASONE 0.3-0.1 % OT SUSP
4.0000 [drp] | Freq: Two times a day (BID) | OTIC | Status: DC
Start: 2023-04-05 — End: 2023-04-05
  Administered 2023-04-05: 4 [drp] via OTIC
  Filled 2023-04-05: qty 7.5

## 2023-04-05 MED ORDER — MECLIZINE HCL 25 MG PO TABS
25.0000 mg | ORAL_TABLET | Freq: Once | ORAL | Status: AC
  Administered 2023-04-05: 25 mg via ORAL
  Filled 2023-04-05: qty 1

## 2023-04-05 MED ORDER — CIPROFLOXACIN-DEXAMETHASONE 0.3-0.1 % OT SUSP: 4.0000 [drp] | Freq: Two times a day (BID) | OTIC | 0 refills | Status: DC

## 2023-04-05 MED ORDER — MECLIZINE HCL 25 MG PO TABS: 25.0000 mg | ORAL_TABLET | Freq: Three times a day (TID) | ORAL | 0 refills | Status: DC | PRN

## 2023-04-05 MED ORDER — ONDANSETRON HCL 4 MG PO TABS: 4.0000 mg | ORAL_TABLET | Freq: Four times a day (QID) | ORAL | 0 refills | Status: DC

## 2023-04-05 NOTE — ED Provider Notes (Signed)
 Patient seen after prior EDP.  Workup in the ED today is without significant acute abnormality.  Patient feels improved.  Importance of close follow-up stressed.  Strict return precautions given and understood.     Laurice Maude BROCKS, MD 04/05/23 [id number]

## 2023-04-05 NOTE — ED Provider Notes (Signed)
 [place] [facility] AT [facility] Provider Note   CSN: [id number] Arrival date & time: 04/05/23  0327     History  Chief Complaint  Patient presents with   Otalgia    Alisha Webster is a 53 y.o. female.   Otalgia Patient presents for multiple complaints.  Medical history includes anxiety, depression, HTN, HLD.  She has had recurrent ear infections throughout her life.  Recently, she was admitted to a hospital in [place] for what she describes as bilateral triple ear infections (inner, middle, and external.  She was on antibiotic eardrops as well as IV antibiotics.  She was discharged on several days more of antibiotics, which she has been taking.  She feels that she has 1 more day left of them.  Although she initially felt better at time of discharge 3 days ago, she developed fatigue yesterday.  She had some chest heaviness and shortness of breath.  She also has had some positional vertigo.     Home Medications Prior to Admission medications   Medication Sig Start Date End Date Taking? Authorizing Provider  amLODipine  (NORVASC ) 10 MG tablet Take 10 mg by mouth daily. Patient not taking: Reported on 12/04/2022    [provider]  ARIPiprazole  (ABILIFY ) 5 MG tablet Take 5 mg by mouth daily.    [provider]  busPIRone  (BUSPAR ) 10 MG tablet Take 10 mg by mouth 2 (two) times daily.    [provider]  gabapentin  (NEURONTIN ) 100 MG capsule Take 200 mg by mouth 3 (three) times daily.    [provider]  hydrochlorothiazide  (HYDRODIURIL ) 25 MG tablet Take 25 mg by mouth daily.    [provider]  levETIRAcetam  (KEPPRA ) 1000 MG tablet Take 1,000 mg by mouth 2 (two) times daily.    [provider]  levothyroxine  (SYNTHROID ) 88 MCG tablet Take 88 mcg by mouth in the morning.    [provider]  methocarbamol  (ROBAXIN ) 500 MG tablet Take 1 tablet (500 mg total) by mouth 2 (two) times daily. 12/04/22    Logan Ubaldo NOVAK, PA-C  omeprazole (PRILOSEC) 20 MG capsule Take 20 mg by mouth daily. 09/03/22 12/12/22  [provider]  potassium chloride  SA (KLOR-CON  M) 20 MEQ tablet Take 1 tablet (20 mEq total) by mouth daily. 12/12/22   Griselda Norris, MD  venlafaxine  XR (EFFEXOR -XR) 75 MG 24 hr capsule Take 150 mg by mouth in the morning.    [provider]      Allergies    Acetaminophen , Lisinopril, Norvasc  [amlodipine ], Reglan  [metoclopramide ], and Statins    Review of Systems   Review of Systems  Constitutional:  Positive for fatigue.  HENT:  Positive for ear pain.   Respiratory:  Positive for chest tightness and shortness of breath.   Neurological:  Positive for dizziness.  All other systems reviewed and are negative.   Physical Exam Updated Vital Signs BP (!) 156/105   Pulse 87   Temp 97.9 F (36.6 C) (Oral)   Resp 16   SpO2 100%  Physical Exam Vitals and nursing note reviewed.  Constitutional:      General: She is not in acute distress.    Appearance: Normal appearance. She is well-developed. She is not ill-appearing, toxic-appearing or diaphoretic.  HENT:     Head: Normocephalic and atraumatic.     Ears:     Comments: Bilateral scarred TMs.  There is some EAC drainage, primarily on left ear.    Nose: Nose normal.  Mouth/Throat:     Mouth: Mucous membranes are moist.  Eyes:     Extraocular Movements: Extraocular movements intact.     Conjunctiva/sclera: Conjunctivae normal.  Cardiovascular:     Rate and Rhythm: Normal rate and regular rhythm.  Pulmonary:     Effort: Pulmonary effort is normal. No respiratory distress.  Abdominal:     General: There is no distension.     Palpations: Abdomen is soft.     Tenderness: There is no abdominal tenderness.  Musculoskeletal:        General: No swelling. Normal range of motion.     Cervical back: Neck supple.  Skin:    General: Skin is warm and dry.     Coloration: Skin is not jaundiced or pale.   Neurological:     General: No focal deficit present.     Mental Status: She is alert and oriented to person, place, and time.  Psychiatric:        Mood and Affect: Mood normal.        Behavior: Behavior normal.     ED Results / Procedures / Treatments   Labs (all labs ordered are listed, but only abnormal results are displayed) Labs Reviewed  BASIC METABOLIC PANEL - Abnormal; Notable for the following components:      Result Value   Glucose, Bld 110 (*)    BUN 25 (*)    All other components within normal limits  CBC  MAGNESIUM   BRAIN NATRIURETIC PEPTIDE  CBG MONITORING, ED  TROPONIN I (HIGH SENSITIVITY)  TROPONIN I (HIGH SENSITIVITY)    EKG EKG Interpretation Date/Time:  Monday April 05 2023 06:04:08 EST Ventricular Rate:  85 PR Interval:  145 QRS Duration:  103 QT Interval:  420 QTC Calculation: 500 R Axis:   64  Text Interpretation: Sinus rhythm Nonspecific T abnormalities, lateral leads Borderline prolonged QT interval Confirmed by Melvenia Motto ([id number]) on 04/05/2023 6:12:23 AM  Radiology DG Chest Portable 1 View Result Date: 04/05/2023 CLINICAL DATA:  Congestion and ear pain.  Dizziness. EXAM: PORTABLE CHEST 1 VIEW COMPARISON:  08/14/2022 FINDINGS: The lungs are clear without focal pneumonia, edema, pneumothorax or pleural effusion. The cardiopericardial silhouette is within normal limits for size. No acute bony abnormality. IMPRESSION: No active disease. Electronically Signed   By: Camellia Candle M.D.   On: 04/05/2023 06:02    Procedures Procedures    Medications Ordered in ED Medications  ciprofloxacin -dexamethasone  (CIPRODEX ) 0.3-0.1 % OTIC (EAR) suspension 4 drop (4 drops Both EARS Given 04/05/23 0636)  meclizine  (ANTIVERT ) tablet 25 mg (25 mg Oral Given 04/05/23 0636)    ED Course/ Medical Decision Making/ A&P                                 Medical Decision Making Amount and/or Complexity of Data Reviewed Labs: ordered. Radiology:  ordered.  Risk Prescription drug management.   This patient presents to the ED for concern of chest pain, shortness of breath, fatigue, this involves an extensive number of treatment options, and is a complaint that carries with it a high risk of complications and morbidity.  The differential diagnosis includes deconditioning, anemia, metabolic derangements, URI, ACS, pneumonia, other infection   Co morbidities that complicate the patient evaluation  anxiety, depression, HTN, HLD   Additional history obtained:  Additional history obtained from N/A External records from outside source obtained and reviewed including EMR   Lab Tests:  I Ordered,  and personally interpreted labs.  The pertinent results include: Normal hemoglobin, no leukocytosis, normal troponin, normal BNP, normal kidney function, normal electrolytes   Imaging Studies ordered:  I ordered imaging studies including chest x-ray I independently visualized and interpreted imaging which showed no acute findings I agree with the radiologist interpretation   Cardiac Monitoring: / EKG:  The patient was maintained on a cardiac monitor.  I personally viewed and interpreted the cardiac monitored which showed an underlying rhythm of: Sinus rhythm  Problem List / ED Course / Critical interventions / Medication management  Patient presenting for positional vertigo, fatigue, chest pressure and shortness of breath.  She is currently near completion of antibiotics for treatment of bilateral ear infections.  She states that these infections were both inner and external.  She was hospitalized for 1 week.  She was discharged 3 days ago.  She has been taking her antibiotics at home, including otic drops.  On exam, she is overall well-appearing.  Positional vertigo likely secondary to inflammation from her recent infection.  She has no focal neurologic deficits to suggest central etiology.  Dose of meclizine  was ordered.  Patient also  reports that she has been had some chest pressure, shortness of breath, and fatigue.  She has been seen by cardiology but not in the past 10 years.  Workup was initiated.  Patient's lab results were reassuring.  On reassessment, patient sleeping comfortably.  Repeat troponin pending at time of signout.  Care of patient was signed out to oncoming ED provider. I ordered medication including meclizine  for positional vertigo, Ciprodex  for ongoing treatment of otitis externa Reevaluation of the patient after these medicines showed that the patient improved I have reviewed the patients home medicines and have made adjustments as needed   Social Determinants of Health:  Lives independently         Final Clinical Impression(s) / ED Diagnoses Final diagnoses:  Chest discomfort    Rx / DC Orders ED Discharge Orders     None         Melvenia Motto, MD 04/05/23 [phone]

## 2023-04-05 NOTE — Discharge Instructions (Signed)
 Return for any problem.  ?

## 2023-04-05 NOTE — ED Triage Notes (Addendum)
 Patient arrived with complaints of congestion and ear pain, recently diagnosed with bilateral middle ear infection. States she feels dizzy when she moves around.

## 2023-07-10 ENCOUNTER — Emergency Department (HOSPITAL_COMMUNITY)
Admission: EM | Admit: 2023-07-10 | Discharge: 2023-07-10 | Disposition: A | Discharge status: Other Acute Inpatient Hospital | Payer: MEDICAID | Attending: Emergency Medicine | Admitting: Emergency Medicine

## 2023-07-10 ENCOUNTER — Other Ambulatory Visit (HOSPITAL_COMMUNITY)
Admission: EM | Admit: 2023-07-10 | Discharge: 2023-07-16 | Disposition: A | Discharge status: Home / Self Care | Payer: MEDICAID | Source: Home / Self Care | Admitting: Psychiatry

## 2023-07-10 ENCOUNTER — Other Ambulatory Visit: Payer: Self-pay

## 2023-07-10 ENCOUNTER — Encounter (HOSPITAL_COMMUNITY): Payer: Self-pay | Admitting: Emergency Medicine

## 2023-07-10 DIAGNOSIS — F329 Major depressive disorder, single episode, unspecified: Secondary | ICD-10-CM | POA: Insufficient documentation

## 2023-07-10 DIAGNOSIS — G894 Chronic pain syndrome: Secondary | ICD-10-CM | POA: Diagnosis present

## 2023-07-10 DIAGNOSIS — Z79890 Hormone replacement therapy (postmenopausal): Secondary | ICD-10-CM | POA: Insufficient documentation

## 2023-07-10 DIAGNOSIS — M3500 Sicca syndrome, unspecified: Secondary | ICD-10-CM | POA: Insufficient documentation

## 2023-07-10 DIAGNOSIS — L600 Ingrowing nail: Secondary | ICD-10-CM | POA: Insufficient documentation

## 2023-07-10 DIAGNOSIS — F603 Borderline personality disorder: Secondary | ICD-10-CM | POA: Diagnosis not present

## 2023-07-10 DIAGNOSIS — R112 Nausea with vomiting, unspecified: Secondary | ICD-10-CM | POA: Diagnosis not present

## 2023-07-10 DIAGNOSIS — F419 Anxiety disorder, unspecified: Secondary | ICD-10-CM | POA: Insufficient documentation

## 2023-07-10 DIAGNOSIS — F1524 Other stimulant dependence with stimulant-induced mood disorder: Secondary | ICD-10-CM | POA: Diagnosis not present

## 2023-07-10 DIAGNOSIS — Z5900 Homelessness unspecified: Secondary | ICD-10-CM | POA: Insufficient documentation

## 2023-07-10 DIAGNOSIS — E039 Hypothyroidism, unspecified: Secondary | ICD-10-CM | POA: Diagnosis present

## 2023-07-10 DIAGNOSIS — F1914 Other psychoactive substance abuse with psychoactive substance-induced mood disorder: Secondary | ICD-10-CM | POA: Diagnosis not present

## 2023-07-10 DIAGNOSIS — I10 Essential (primary) hypertension: Secondary | ICD-10-CM | POA: Diagnosis not present

## 2023-07-10 DIAGNOSIS — F1523 Other stimulant dependence with withdrawal: Secondary | ICD-10-CM | POA: Diagnosis not present

## 2023-07-10 DIAGNOSIS — M79675 Pain in left toe(s): Secondary | ICD-10-CM | POA: Insufficient documentation

## 2023-07-10 DIAGNOSIS — F411 Generalized anxiety disorder: Secondary | ICD-10-CM | POA: Diagnosis not present

## 2023-07-10 DIAGNOSIS — R45851 Suicidal ideations: Secondary | ICD-10-CM | POA: Diagnosis not present

## 2023-07-10 DIAGNOSIS — F1410 Cocaine abuse, uncomplicated: Secondary | ICD-10-CM | POA: Insufficient documentation

## 2023-07-10 DIAGNOSIS — G40909 Epilepsy, unspecified, not intractable, without status epilepticus: Secondary | ICD-10-CM | POA: Diagnosis not present

## 2023-07-10 DIAGNOSIS — R4584 Anhedonia: Secondary | ICD-10-CM | POA: Insufficient documentation

## 2023-07-10 DIAGNOSIS — F332 Major depressive disorder, recurrent severe without psychotic features: Secondary | ICD-10-CM | POA: Diagnosis not present

## 2023-07-10 DIAGNOSIS — Z5989 Other problems related to housing and economic circumstances: Secondary | ICD-10-CM | POA: Insufficient documentation

## 2023-07-10 DIAGNOSIS — F1994 Other psychoactive substance use, unspecified with psychoactive substance-induced mood disorder: Secondary | ICD-10-CM | POA: Diagnosis present

## 2023-07-10 DIAGNOSIS — F1110 Opioid abuse, uncomplicated: Secondary | ICD-10-CM | POA: Insufficient documentation

## 2023-07-10 DIAGNOSIS — E785 Hyperlipidemia, unspecified: Secondary | ICD-10-CM | POA: Diagnosis not present

## 2023-07-10 DIAGNOSIS — Z765 Malingerer [conscious simulation]: Secondary | ICD-10-CM | POA: Insufficient documentation

## 2023-07-10 DIAGNOSIS — Z79899 Other long term (current) drug therapy: Secondary | ICD-10-CM | POA: Insufficient documentation

## 2023-07-10 DIAGNOSIS — Y900 Blood alcohol level of less than 20 mg/100 ml: Secondary | ICD-10-CM | POA: Diagnosis not present

## 2023-07-10 DIAGNOSIS — F1510 Other stimulant abuse, uncomplicated: Secondary | ICD-10-CM | POA: Insufficient documentation

## 2023-07-10 DIAGNOSIS — K219 Gastro-esophageal reflux disease without esophagitis: Secondary | ICD-10-CM | POA: Insufficient documentation

## 2023-07-10 DIAGNOSIS — F1520 Other stimulant dependence, uncomplicated: Secondary | ICD-10-CM | POA: Diagnosis present

## 2023-07-10 LAB — RAPID URINE DRUG SCREEN, HOSP PERFORMED
Amphetamines: POSITIVE — AB
Barbiturates: NOT DETECTED
Benzodiazepines: NOT DETECTED
Cocaine: POSITIVE — AB
Opiates: NOT DETECTED
Tetrahydrocannabinol: NOT DETECTED

## 2023-07-10 LAB — COMPREHENSIVE METABOLIC PANEL WITH GFR
ALT: 31 U/L (ref 0–44)
AST: 39 U/L (ref 15–41)
Albumin: 3.5 g/dL (ref 3.5–5.0)
Alkaline Phosphatase: 86 U/L (ref 38–126)
Anion gap: 10 (ref 5–15)
BUN: 14 mg/dL (ref 6–20)
CO2: 28 mmol/L (ref 22–32)
Calcium: 8.7 mg/dL — ABNORMAL LOW (ref 8.9–10.3)
Chloride: 100 mmol/L (ref 98–111)
Creatinine, Ser: 0.81 mg/dL (ref 0.44–1.00)
GFR, Estimated: >60 mL/min (ref >60)
Glucose, Bld: 131 mg/dL — ABNORMAL HIGH (ref 70–99)
Potassium: 2.9 mmol/L — ABNORMAL LOW (ref 3.5–5.1)
Sodium: 138 mmol/L (ref 135–145)
Total Bilirubin: 0.5 mg/dL (ref 0.0–1.2)
Total Protein: 6.9 g/dL (ref 6.5–8.1)

## 2023-07-10 LAB — CBC WITH DIFFERENTIAL/PLATELET
Abs Immature Granulocytes: 0.03 10*3/uL (ref 0.00–0.07)
Basophils Absolute: 0.1 10*3/uL (ref 0.0–0.1)
Basophils Relative: 1 %
Eosinophils Absolute: 0.1 10*3/uL (ref 0.0–0.5)
Eosinophils Relative: 2 %
HCT: 39.6 % (ref 36.0–46.0)
Hemoglobin: 13.2 g/dL (ref 12.0–15.0)
Immature Granulocytes: 0 %
Lymphocytes Relative: 22 %
Lymphs Abs: 1.5 10*3/uL (ref 0.7–4.0)
MCH: 29.3 pg (ref 26.0–34.0)
MCHC: 33.3 g/dL (ref 30.0–36.0)
MCV: 88 fL (ref 80.0–100.0)
Monocytes Absolute: 0.3 10*3/uL (ref 0.1–1.0)
Monocytes Relative: 4 %
Neutro Abs: 4.9 10*3/uL (ref 1.7–7.7)
Neutrophils Relative %: 71 %
Platelets: 323 10*3/uL (ref 150–400)
RBC: 4.5 MIL/uL (ref 3.87–5.11)
RDW: 12.9 % (ref 11.5–15.5)
WBC: 6.9 10*3/uL (ref 4.0–10.5)
nRBC: 0 % (ref 0.0–0.2)

## 2023-07-10 LAB — ETHANOL: Alcohol, Ethyl (B): <10 mg/dL (ref <10)

## 2023-07-10 MED ORDER — LEVETIRACETAM 500 MG PO TABS
1000.0000 mg | ORAL_TABLET | Freq: Two times a day (BID) | ORAL | Status: DC
  Filled 2023-07-10: qty 2

## 2023-07-10 MED ORDER — DIPHENHYDRAMINE HCL 50 MG/ML IJ SOLN: 50.0000 mg | Freq: Three times a day (TID) | INTRAMUSCULAR | Status: DC | PRN

## 2023-07-10 MED ORDER — HALOPERIDOL LACTATE 5 MG/ML IJ SOLN: 10.0000 mg | Freq: Three times a day (TID) | INTRAMUSCULAR | Status: DC | PRN

## 2023-07-10 MED ORDER — ARIPIPRAZOLE 5 MG PO TABS
5.0000 mg | ORAL_TABLET | Freq: Every day | ORAL | Status: DC
  Administered 2023-07-11 – 2023-07-13 (×3): 5 mg via ORAL
  Filled 2023-07-10 (×4): qty 1

## 2023-07-10 MED ORDER — IBUPROFEN 400 MG PO TABS: 600.0000 mg | ORAL_TABLET | Freq: Three times a day (TID) | ORAL | Status: DC | PRN

## 2023-07-10 MED ORDER — HALOPERIDOL LACTATE 5 MG/ML IJ SOLN: 5.0000 mg | Freq: Three times a day (TID) | INTRAMUSCULAR | Status: DC | PRN

## 2023-07-10 MED ORDER — ARIPIPRAZOLE 5 MG PO TABS: 5.0000 mg | ORAL_TABLET | Freq: Every day | ORAL | Status: DC

## 2023-07-10 MED ORDER — LORAZEPAM 2 MG/ML IJ SOLN: 2.0000 mg | Freq: Three times a day (TID) | INTRAMUSCULAR | Status: DC | PRN

## 2023-07-10 MED ORDER — VENLAFAXINE HCL ER 150 MG PO CP24: 150.0000 mg | ORAL_CAPSULE | Freq: Every day | ORAL | Status: DC

## 2023-07-10 MED ORDER — ALUM & MAG HYDROXIDE-SIMETH 200-200-20 MG/5ML PO SUSP: 30.0000 mL | Freq: Four times a day (QID) | ORAL | Status: DC | PRN

## 2023-07-10 MED ORDER — NICOTINE 21 MG/24HR TD PT24: 21.0000 mg | MEDICATED_PATCH | Freq: Every day | TRANSDERMAL | Status: DC

## 2023-07-10 MED ORDER — BUSPIRONE HCL 10 MG PO TABS: 10.0000 mg | ORAL_TABLET | Freq: Two times a day (BID) | ORAL | Status: DC

## 2023-07-10 MED ORDER — GABAPENTIN 300 MG PO CAPS
300.0000 mg | ORAL_CAPSULE | Freq: Two times a day (BID) | ORAL | Status: DC
  Administered 2023-07-10: 300 mg via ORAL
  Filled 2023-07-10: qty 1

## 2023-07-10 MED ORDER — POTASSIUM CHLORIDE CRYS ER 20 MEQ PO TBCR
40.0000 meq | EXTENDED_RELEASE_TABLET | Freq: Once | ORAL | Status: AC
  Administered 2023-07-10: 40 meq via ORAL
  Filled 2023-07-10: qty 2

## 2023-07-10 MED ORDER — DIPHENHYDRAMINE HCL 50 MG PO CAPS
50.0000 mg | ORAL_CAPSULE | Freq: Three times a day (TID) | ORAL | Status: DC | PRN
Start: 2023-07-10 — End: 2023-07-16

## 2023-07-10 MED ORDER — ZOLPIDEM TARTRATE 5 MG PO TABS: 5.0000 mg | ORAL_TABLET | Freq: Every evening | ORAL | Status: DC | PRN

## 2023-07-10 MED ORDER — ONDANSETRON HCL 4 MG PO TABS: 4.0000 mg | ORAL_TABLET | Freq: Three times a day (TID) | ORAL | Status: DC | PRN

## 2023-07-10 MED ORDER — HALOPERIDOL 5 MG PO TABS: 5.0000 mg | ORAL_TABLET | Freq: Three times a day (TID) | ORAL | Status: DC | PRN

## 2023-07-10 NOTE — ED Notes (Signed)
 Pt belongings in purple zone locker 5

## 2023-07-10 NOTE — Consult Note (Addendum)
 [name]  Patient Name: .Alisha Webster  MRN: [id number]  DOB: 1970/12/22  Consult Order details:  Orders (From admission, onward)     Start     Ordered   07/10/23 1158  CONSULT TO CALL ACT TEAM       Ordering Provider: Debbra Fairy, PA-C  Provider:  (Not yet assigned)  Question:  Reason for Consult?  Answer:  Psych consult   07/10/23 1158             Mode of Visit: In person    Psychiatry Consult Evaluation  Service Date: July 10, 2023 LOS:  LOS: 0 days  Chief Complaint suicidal ideations with a plan to overdose  Primary Psychiatric Diagnoses  Suicidal ideations Major depressive disorder Substance-induced mood disorder Borderline personality disorder   Assessment  Alisha Webster is a 53 y.o. female evaluated due to suicidal ideations and worsening depression Presented to the EDfor suicidal ideations with a plan to overdose 07/10/2023 11:29 AM She carries the psychiatric diagnoses of major depressive disorder, generalized anxiety disorder substance-induced mood disorder and has a past medical history of hypertension, thyroid disorder.   Her current presentation of suicidal ideation is most consistent with major depressive disorder. She meets criteria for inpatient admission based on plan to overdose.  Current outpatient psychotropic medications include gabapentin, Abilify and Effexor and historically she has had a fair response to these medications. She stated that she is compliant with medications prior to admission as evidenced by she reports she is followed up with [facility] health services Dr. Lov. On initial examination, patient sitting in bed slightly restless states recently doing crystal meth and cocaine last night. Please see plan below for detailed recommendations.   Diagnoses:  Active Hospital problems: Principal Problem:   Suicidal ideation Active Problems:   MDD (major depressive disorder), recurrent severe, without  psychosis (HCC)    Plan   ## Psychiatric Medication Recommendations:  Inpatient mission consideration for facility based crisis [facility])   ## Medical Decision Making Capacity: Not specifically addressed in this encounter  ## Further Work-up:  -- Pending CBC's, CMP, UDS and EKG TSH, B12, folate, EKG, or U/A -- most recent EKG-pending results -- Pertinent labwork reviewed earlier this admission includes: Pending results   ## Disposition:-- We recommend inpatient psychiatric hospitalization when medically cleared. Patient is under voluntary admission status at this time; please IVC if attempts to leave hospital.  ## Behavioral / Environmental: - No specific recommendations at this time.     ## Safety and Observation Level:  - Based on my clinical evaluation, I estimate the patient to be at moderate risk of self harm in the current setting. - At this time, we recommend  routine. This decision is based on my review of the chart including patient's history and current presentation, interview of the patient, mental status examination, and consideration of suicide risk including evaluating suicidal ideation, plan, intent, suicidal or self-harm behaviors, risk factors, and protective factors. This judgment is based on our ability to directly address suicide risk, implement suicide prevention strategies, and develop a safety plan while the patient is in the clinical setting. Please contact our team if there is a concern that risk level has changed.  CSSR Risk Category:C-SSRS RISK CATEGORY: High Risk  Suicide Risk Assessment: Patient has following modifiable risk factors for suicide: active suicidal ideation, which we are addressing by connecting patient with additional outpatient resources at discharge. Patient has following non-modifiable or demographic risk factors for suicide: history  of suicide attempt and history of self harm behavior Patient has the following protective factors against  suicide: Access to outpatient mental health care  Thank you for this consult request. Recommendations have been communicated to the primary team.  We will inpatient admission at this time.   Levester Reagin, NP       History of Present Illness  Relevant Aspects of Hospital ED Course:  Admitted on 07/10/2023 for suicidal ideation with a plan to overdose on medications  Patient Report:  Alisha Webster 53 year old Caucasian female presents due to suicidal ideations  with a plan to overdose.  Alisha Webster carries a diagnosis related to major depressive disorder, generalized anxiety disorder, borderline personality disorder.  With the medical history related to hypertension, dyslipidemia, hypothyroidism, seizure disorder and chronic pain  Yarelin reports she presents to the emergency department due to being " kicked out"  of where she was staying.  States she has been homeless for a while.  She denies that she is currently employed.  States she is waiting for disability due to knee pain.  Reports using crystal methamphetamines and cocaine last night.  "  I do not usually do cocaine."  Reports plans to overdose.   She reports she is currently followed by psychiatry services through [facility] where she was prescribed Effexor, Abilify, gabapentin and BuSpar.  Reports taking potassium and magnesium daily.  states she has been taking and tolerating medications well.  States she was last hospitalized 2 months prior however is unable to recall the name of the facility at this time.  Chart reviewed patient discharged from [facility] 11/2022  During evaluation Alisha Webster is resting in bed, pleasant, cooperative; she is alert/oriented x 3; calm/cooperative; and mood congruent with affect.  Patient is speaking in a clear tone at moderate volume, and normal pace; with fair eye contact. Her thought process is coherent and relevant; There is no indication that she is currently responding to internal/external  stimuli or experiencing delusional thought content.  Patient denies suicidal/self-harm/homicidal ideation, psychosis, and paranoia.  Patient has remained calm throughout assessment and has answered questions appropriately.   Psych ROS:  Depression: Reported increased depression Anxiety: And anxiety symptoms Mania (lifetime and current): Denied Psychosis: (lifetime and current): Denied   Review of Systems  Psychiatric/Behavioral:  Positive for depression and suicidal ideas.      Psychiatric and Social History  Psychiatric History:    Prev Dx/Sx: Major depressive disorder, generalized anxiety disorder Current Psych Provider: [facility], [facility] Meds (current): Effexor, BuSpar, gabapentin and hydroxyzine Previous Med Trials: Unable to recall at this time but states she has tried multiple medications in the past Therapy: Denied  Prior Psych Hospitalization: Reports 2 months ago Prior Self Harm:  yes, Overdose Prior Violence: Denied  Family Psych History: Unknown documented family history Family Hx suicide: Denied  Social History:  Developmental Hx: n/A Educational Hx: n/a Occupational Hx: Seeking disability Legal Hx: Denied Living Situation: Homelessness Spiritual Hx: n/a Access to weapons/lethal means: Denied  Substance History Alcohol: denied  Type of alcohol  Last Drink  Number of drinks per day  History of alcohol withdrawal seizures documented history related to seizure disorder History of DT's  Tobacco:  Illicit drugs: Methamphetamines and cocaine Prescription drug abuse: Documented history related to drug-seeking behaviors Rehab hx: N/a  Exam Findings  Physical Exam:  Vital Signs:  Temp:  [98.5 F (36.9 C)] 98.5 F (36.9 C) (04/12 1133) Pulse Rate:  [87] 87 (04/12  1133) Resp:  [18] 18 (04/12 1133) BP: (166)/(126) 166/126 (04/12 1133) SpO2:  [94 %] 94 % (04/12 1133) Weight:  [77.1 kg] 77.1 kg (04/12 1137) Blood  pressure (!) 166/126, pulse 87, temperature 98.5 F (36.9 C), temperature source Oral, resp. rate 18, height 5\' 4"  (1.626 m), weight 77.1 kg, SpO2 94%. Body mass index is 29.18 kg/m.  Physical Exam  Mental Status Exam: General Appearance: Casual  Orientation:  Full (Time, Place, and Person)  Memory:  Immediate;   Good Recent;   Fair  Concentration:  Concentration: Good  Recall:  Good  Attention  Fair  Eye Contact:  Good  Speech:  Clear and Coherent  Language:  Good  Volume:  Normal  Mood: Congruent  Affect:  Congruent  Thought Process:  Coherent  Thought Content:  Logical  Suicidal Thoughts:  Yes.  with intent/plan  Homicidal Thoughts:  No  Judgement:  Good  Insight:  Good  Psychomotor Activity:  Normal  Akathisia:  No  Fund of Knowledge:  Good      Assets:  Communication Skills Desire for Improvement Resilience Social Support  Cognition:  WNL  ADL's:  Intact  AIMS (if indicated):        Other History   These have been pulled in through the EMR, reviewed, and updated if appropriate.  Family History:  The patient's family history is not on file.  Medical History: Past Medical History:  Diagnosis Date   Anxiety    Back pain    Depression    Hyperlipidemia    Hypertension     Surgical History: Past Surgical History:  Procedure Laterality Date   CHOLECYSTECTOMY     for gallstones   Tonisillectomy     TOTAL ABDOMINAL HYSTERECTOMY     One ovary left     Medications:   Current Facility-Administered Medications:    alum & mag hydroxide-simeth (MAALOX/MYLANTA) 200-200-20 MG/5ML suspension 30 mL, 30 mL, Oral, Q6H PRN, Tran, Bowie, PA-C   [START ON 07/11/2023] ARIPiprazole (ABILIFY) tablet 5 mg, 5 mg, Oral, Daily, Nahsir Venezia N, NP   gabapentin (NEURONTIN) capsule 300 mg, 300 mg, Oral, BID, Evann Erazo N, NP   ibuprofen (ADVIL) tablet 600 mg, 600 mg, Oral, Q8H PRN, Tran, Bowie, PA-C   nicotine (NICODERM CQ - dosed in mg/24 hours) patch 21 mg, 21 mg,  Transdermal, Daily, Tran, Bowie, PA-C   ondansetron (ZOFRAN) tablet 4 mg, 4 mg, Oral, Q8H PRN, Tran, Bowie, PA-C   [START ON 07/11/2023] venlafaxine XR (EFFEXOR-XR) 24 hr capsule 150 mg, 150 mg, Oral, Daily, Harce Volden N, NP   zolpidem (AMBIEN) tablet 5 mg, 5 mg, Oral, QHS PRN, Tran, Bowie, PA-C  Current Outpatient Medications:    ARIPiprazole (ABILIFY) 5 MG tablet, Take 5 mg by mouth daily., Disp: , Rfl:    busPIRone (BUSPAR) 10 MG tablet, Take 10 mg by mouth 2 (two) times daily., Disp: , Rfl:    gabapentin (NEURONTIN) 600 MG tablet, Take 600 mg by mouth 3 (three) times daily., Disp: , Rfl:    hydrochlorothiazide (HYDRODIURIL) 25 MG tablet, Take 25 mg by mouth daily., Disp: , Rfl:    ibuprofen (ADVIL) 200 MG tablet, Take 800 mg by mouth 2 (two) times daily., Disp: , Rfl:    levothyroxine (SYNTHROID) 88 MCG tablet, Take 88 mcg by mouth daily., Disp: , Rfl:    magnesium oxide (MAG-OX) 400 (240 Mg) MG tablet, Take 400 mg by mouth daily., Disp: , Rfl:    methocarbamol (ROBAXIN) 500 MG  tablet, Take 1 tablet (500 mg total) by mouth 2 (two) times daily. (Patient taking differently: Take 500 mg by mouth daily as needed for muscle spasms.), Disp: 20 tablet, Rfl: 0   omeprazole (PRILOSEC) 20 MG capsule, Take 20 mg by mouth daily., Disp: , Rfl:    oxyCODONE HCl 10 MG TABA, Take 10 mg by mouth 3 (three) times daily., Disp: , Rfl:    venlafaxine XR (EFFEXOR-XR) 150 MG 24 hr capsule, Take 150 mg by mouth daily., Disp: , Rfl:    Vitamin D, Ergocalciferol, (DRISDOL) 1.25 MG (50000 UNIT) CAPS capsule, Take 50,000 Units by mouth every Monday., Disp: , Rfl:    levETIRAcetam (KEPPRA) 1000 MG tablet, Take 1,000 mg by mouth 2 (two) times daily., Disp: , Rfl:    potassium chloride SA (KLOR-CON M) 20 MEQ tablet, Take 1 tablet (20 mEq total) by mouth daily. (Patient not taking: Reported on 07/10/2023), Disp: 7 tablet, Rfl: 0  Allergies: Allergies  Allergen Reactions   Deltasone [Prednisone] Other (See Comments)     Weakness  Jitteriness  Night sweats   Lopid [Gemfibrozil] Other (See Comments)    Myalgias    Norvasc [Amlodipine] Swelling    Leg swelling   Reglan [Metoclopramide] Other (See Comments)    Felt hot Flushing   Statins Other (See Comments)    Myalgias   Tylenol [Acetaminophen] Other (See Comments)    "Liver disease"   Zestril [Lisinopril] Other (See Comments)    "made kidneys shut down"    Levester Reagin, NP

## 2023-07-10 NOTE — ED Notes (Signed)
Pt aware we need urine sample.  

## 2023-07-10 NOTE — ED Provider Notes (Signed)
 [place] EMERGENCY DEPARTMENT AT [facility] Provider Note   CSN: [id number] Arrival date & time: 07/10/23  1127     History  Chief Complaint  Patient presents with   Suicidal   Toe Pain    Alisha Webster is a 53 y.o. female.  The history is provided by the patient and medical records. No language interpreter was used.  Toe Pain     53 year old female history of depression, anxiety, Sjogren's syndrome, hypertension, hyperlipidemia presenting with suicidal ideation.  Patient states she recently became homeless and therefore she felt suicidal with plan to overdose on her medication.  States she has attempted to harm herself in the past.  She is here requesting for help with her psychiatric problem.  Furthermore, she mention she has had ingrown toenails involving her left great toe for "a while" worsened last night.  Patient also states she has been picking at her fingers from having hangnails and complaining of pain to some of her fingers.  She denies any auditory or visual hallucination.  She denies any homicidal ideation.  Sleep and eating habits about the same.  No recent change in any of her medication.  She is here voluntarily.  Home Medications Prior to Admission medications   Medication Sig Start Date End Date Taking? Authorizing Provider  amLODipine (NORVASC) 10 MG tablet Take 10 mg by mouth daily. Patient not taking: Reported on 12/04/2022    [provider]  ARIPiprazole (ABILIFY) 5 MG tablet Take 5 mg by mouth daily.    [provider]  busPIRone (BUSPAR) 10 MG tablet Take 10 mg by mouth 2 (two) times daily.    [provider]  ciprofloxacin-dexamethasone (CIPRODEX) OTIC suspension Place 4 drops into both ears 2 (two) times daily. 04/05/23   Burnette Carte, MD  gabapentin (NEURONTIN) 100 MG capsule Take 200 mg by mouth 3 (three) times daily.    [provider]  hydrochlorothiazide (HYDRODIURIL) 25 MG tablet Take 25 mg by mouth  daily.    [provider]  levETIRAcetam (KEPPRA) 1000 MG tablet Take 1,000 mg by mouth 2 (two) times daily.    [provider]  levothyroxine (SYNTHROID) 88 MCG tablet Take 88 mcg by mouth in the morning.    [provider]  Magnesium Oxide -Mg Supplement 420 (252 Mg) MG TABS Take 1 tablet by mouth daily. 03/03/23   [provider]  meclizine (ANTIVERT) 25 MG tablet Take 1 tablet (25 mg total) by mouth 3 (three) times daily as needed for dizziness. 04/05/23   Burnette Carte, MD  methocarbamol (ROBAXIN) 500 MG tablet Take 1 tablet (500 mg total) by mouth 2 (two) times daily. 12/04/22   Elisa Guest, PA-C  omeprazole (PRILOSEC) 20 MG capsule Take 20 mg by mouth daily. 09/03/22 12/12/22  [provider]  ondansetron (ZOFRAN) 4 MG tablet Take 1 tablet (4 mg total) by mouth every 6 (six) hours. 04/05/23   Burnette Carte, MD  potassium chloride SA (KLOR-CON M) 20 MEQ tablet Take 1 tablet (20 mEq total) by mouth daily. 12/12/22   Kelsey Patricia, MD  venlafaxine XR (EFFEXOR-XR) 75 MG 24 hr capsule Take 150 mg by mouth in the morning.    [provider]  Vitamin D, Ergocalciferol, (DRISDOL) 1.25 MG (50000 UNIT) CAPS capsule Take 50,000 Units by mouth once a week. 03/03/23   [provider]      Allergies    Acetaminophen, Lisinopril, Norvasc [amlodipine], Reglan [metoclopramide], and Statins  Review of Systems   Review of Systems  All other systems reviewed and are negative.   Physical Exam Updated Vital Signs BP (!) 166/126   Pulse 87   Temp 98.5 F (36.9 C) (Oral)   Resp 18   Ht 5\' 4"  (1.626 m)   Wt 77.1 kg   SpO2 94%   BMI 29.18 kg/m  Physical Exam Vitals and nursing note reviewed.  Constitutional:      General: She is not in acute distress.    Appearance: She is well-developed.  HENT:     Head: Atraumatic.  Eyes:     Conjunctiva/sclera: Conjunctivae normal.  Cardiovascular:     Rate and Rhythm: Normal rate and  regular rhythm.     Pulses: Normal pulses.     Heart sounds: Normal heart sounds.  Pulmonary:     Effort: Pulmonary effort is normal.  Abdominal:     Palpations: Abdomen is soft.     Tenderness: There is no abdominal tenderness.  Musculoskeletal:     Cervical back: Neck supple.  Skin:    Findings: No rash.     Comments: Examination of the left great toe is somewhat limited as patient has toenail polish however no obvious signs of paronychia.  Mild tenderness noted to the medial nail fold but no signs of infection.  No erythema.  Several localized skin irritation noted to the cuticle nail folds to the left thumb and left ring finger without signs of infection.  Neurological:     Mental Status: She is alert.  Psychiatric:        Mood and Affect: Mood normal.     ED Results / Procedures / Treatments   Labs (all labs ordered are listed, but only abnormal results are displayed) Labs Reviewed  COMPREHENSIVE METABOLIC PANEL WITH GFR - Abnormal; Notable for the following components:      Result Value   Potassium 2.9 (*)    Glucose, Bld 131 (*)    Calcium 8.7 (*)    All other components within normal limits  RAPID URINE DRUG SCREEN, HOSP PERFORMED - Abnormal; Notable for the following components:   Cocaine POSITIVE (*)    Amphetamines POSITIVE (*)    All other components within normal limits  ETHANOL  CBC WITH DIFFERENTIAL/PLATELET    EKG None  Radiology No results found.  Procedures Procedures    Medications Ordered in ED Medications  ibuprofen (ADVIL) tablet 600 mg (has no administration in time range)  zolpidem (AMBIEN) tablet 5 mg (has no administration in time range)  ondansetron (ZOFRAN) tablet 4 mg (has no administration in time range)  alum & mag hydroxide-simeth (MAALOX/MYLANTA) 200-200-20 MG/5ML suspension 30 mL (has no administration in time range)  nicotine (NICODERM CQ - dosed in mg/24 hours) patch 21 mg (has no administration in time range)  venlafaxine  XR (EFFEXOR-XR) 24 hr capsule 150 mg (has no administration in time range)  ARIPiprazole (ABILIFY) tablet 5 mg (has no administration in time range)  gabapentin (NEURONTIN) capsule 300 mg (has no administration in time range)  ARIPiprazole (ABILIFY) tablet 5 mg (has no administration in time range)  busPIRone (BUSPAR) tablet 10 mg (has no administration in time range)  levETIRAcetam (KEPPRA) tablet 1,000 mg (has no administration in time range)  potassium chloride SA (KLOR-CON M) CR tablet 40 mEq (has no administration in time range)    ED Course/ Medical Decision Making/ A&P  Medical Decision Making Amount and/or Complexity of Data Reviewed Labs: ordered.  Risk OTC drugs. Prescription drug management.   BP (!) 166/126   Pulse 87   Temp 98.5 F (36.9 C) (Oral)   Resp 18   Ht 5\' 4"  (1.626 m)   Wt 77.1 kg   SpO2 94%   BMI 29.18 kg/m   85:87 PM  53 year old female history of depression, anxiety, Sjogren's syndrome, hypertension, hyperlipidemia presenting with suicidal ideation.  Patient states she recently became homeless and therefore she felt suicidal with plan to overdose on her medication.  States she has attempted to harm herself in the past.  She is here requesting for help with her psychiatric problem.  Furthermore, she mention she has had ingrown toenails involving her left great toe for "a while" worsened last night.  Patient also states she has been picking at her fingers from having hangnails and complaining of pain to some of her fingers.  She denies any auditory or visual hallucination.  She denies any homicidal ideation.  Sleep and eating habits about the same.  No recent change in any of her medication.  She is here voluntarily.  On exam, patient is resting comfortably appears to be in no acute discomfort.  Examination of her left great toe without any signs of paronychia.  Mild tenderness to the cuticle nail fold but no obvious ingrown  toenails appreciated.  Patient is resting comfortably alert and oriented not responding to internal or external stimuli.  At this time patient is here to be evaluated psychiatrically.  She is here voluntarily.  Labs obtained and UA is positive for cocaine and amphetamine.  Patient also has hypokalemia with potassium of 2.9, supplementation given.  Otherwise patient is medically cleared.          Final Clinical Impression(s) / ED Diagnoses Final diagnoses:  Suicidal ideation    Rx / DC Orders ED Discharge Orders     None         Debbra Fairy, PA-C 07/10/23 1518    Almond Army, MD 07/11/23 [id number]

## 2023-07-10 NOTE — ED Notes (Signed)
 Patient admitted to [facility] from [facility] Patient is voluntary and brought self to hospital du to having an ingrown toenail on her left big toe which could not be assessed properly apparently due to her having toenail polish on her toe.  Patient complained about it upon arrival to unit.  Patient stated to providers while in ED that she was suicidal with the idea to overdose on her medication.  Patient UDSS positive fo0r cocaine and amphetamines.  Patient was sad with constricted affect upon arrival to unit.  She was cooperative with admission process and was oriented to unit and shown to her room.  Patient was given better fitting scrubs and was offered dinner which she refused.  Patient stated she continues to have thoughts of wanting to harm self however contracted for safety and will seek out staff if overwhelmed by thoughts or feelings.  Will monitor.

## 2023-07-10 NOTE — ED Notes (Signed)
 Patient refuse her Keppra 1,000 mg. Per patient "I have not take this medication in months.

## 2023-07-10 NOTE — Group Note (Signed)
 Group Topic: Change and Accountability  Group Date: 07/10/2023 Start Time: 0830 End Time: 0900 Facilitators: Alvino Joseph, NT  Department: [facility]  Number of Participants: 9  Group Focus: coping skills Treatment Modality:  Patient-Centered Therapy Interventions utilized were clarification Purpose: enhance coping skills  Name: Alisha Webster Date of Birth: 02-29-1971  MR: [id number]    Level of Participation: moderate Quality of Participation: cooperative Interactions with others: n/a Mood/Affect: appropriate Triggers (if applicable): n/a Cognition: coherent/clear Progress: Moderate Response: n/a Plan: follow-up needed  Patients Problems:  Patient Active Problem List   Diagnosis Date Noted   Suicidal ideation 07/10/2023   Substance induced mood disorder (HCC) 07/10/2023   GAD (generalized anxiety disorder) 11/24/2022   MDD (major depressive disorder) 11/23/2022   MDD (major depressive disorder), recurrent severe, without psychosis (HCC) 11/23/2022   AKI (acute kidney injury) (HCC) 11/19/2022   Anxiety and depression 11/19/2022   Hypothyroidism 11/19/2022   Sjogren's disease (HCC) 11/19/2022   Syncope 01/25/2013   Hypertension 09/02/2011   Depression 09/02/2011   Hyperlipidemia 09/02/2011   Abdominal pain 09/02/2011

## 2023-07-10 NOTE — ED Notes (Signed)
 Safe tx scheduled for the patient to go to [facility].  ETA is within 15 minutes.

## 2023-07-10 NOTE — ED Triage Notes (Signed)
 Patient arrives ambulatory by POV c/o left big toe pain from ingrown toe nail since yesterday and suicidal ideations since yesterday with plan to overdose on pills. Patient reports intermittently not having a place to stay and would like resources. Patient tearful. States she is on depression meds and has not missed any doses and took it this morning. Patient calm, cooperative and here voluntarily.

## 2023-07-10 NOTE — ED Notes (Signed)
 Pt states she was staying with a friend but the person decided she couldn't stay there any longer. The pt denies having any other family or friends that can help her. Pt started having SI d/t situation.

## 2023-07-10 NOTE — ED Notes (Signed)
 Labs, urine collected. Patient into scrubs and wanded by security. All belongings in LOCKER 5

## 2023-07-10 NOTE — Progress Notes (Addendum)
 LCSW Progress Note:  MRN: [id number]  Alisha Webster 07/10/2023 3:05 PM  Per [facility] AC, [name], NP, patient meets criteria for inpatient treatment. Per [name] [name], RN, we can tentatively accept this pt to [name] pending signed EDP note and resulted labs. FACILTY BASED CRISIS. Address is [address] [place] . Attending MD Augusta Blizzard. bed is 168. RN TO RN report must be called to [phone] , and pt must sign a voluntary consent and fax that to [phone] and send original consent with pt at transport if not using econsent.

## 2023-07-10 NOTE — ED Notes (Signed)
 Patient observed/assessed in patient room lying down asleep in bed. Patient aroused with verbal stimulation after several attempts to which patient responded in an agitated/annoyed demeanor. Patient oriented x 3. Affect is flat/agitated and eye contact is minimal. Patient evasive to preliminary assessment questions. Endorses SI with plan of overdosing. Denies A/V/H. Will continue to monitor for safety.

## 2023-07-10 NOTE — ED Notes (Signed)
 Patient is sleeping. Respirations equal and unlabored, skin warm and dry. No change in assessment or acuity. Routine safety checks conducted according to facility protocol. Will continue to monitor for safety.

## 2023-07-10 NOTE — ED Notes (Signed)
 Report was given to Annemarie, RN at [phone] from [facility]

## 2023-07-11 DIAGNOSIS — F1520 Other stimulant dependence, uncomplicated: Secondary | ICD-10-CM | POA: Diagnosis present

## 2023-07-11 DIAGNOSIS — Z5900 Homelessness unspecified: Secondary | ICD-10-CM

## 2023-07-11 DIAGNOSIS — G894 Chronic pain syndrome: Secondary | ICD-10-CM

## 2023-07-11 LAB — BASIC METABOLIC PANEL WITH GFR
Anion gap: 12 (ref 5–15)
BUN: 15 mg/dL (ref 6–20)
CO2: 28 mmol/L (ref 22–32)
Calcium: 9.2 mg/dL (ref 8.9–10.3)
Chloride: 99 mmol/L (ref 98–111)
Creatinine, Ser: 0.68 mg/dL (ref 0.44–1.00)
GFR, Estimated: >60 mL/min (ref >60)
Glucose, Bld: 103 mg/dL — ABNORMAL HIGH (ref 70–99)
Potassium: 3.6 mmol/L (ref 3.5–5.1)
Sodium: 139 mmol/L (ref 135–145)

## 2023-07-11 LAB — MAGNESIUM: Magnesium: 2 mg/dL (ref 1.7–2.4)

## 2023-07-11 MED ORDER — GABAPENTIN 600 MG PO TABS: 600.0000 mg | ORAL_TABLET | Freq: Three times a day (TID) | ORAL | Status: DC

## 2023-07-11 MED ORDER — NAPROXEN 500 MG PO TABS
500.0000 mg | ORAL_TABLET | Freq: Two times a day (BID) | ORAL | Status: DC | PRN
  Administered 2023-07-13: 500 mg via ORAL
  Filled 2023-07-11: qty 1

## 2023-07-11 MED ORDER — ONDANSETRON 4 MG PO TBDP
4.0000 mg | ORAL_TABLET | Freq: Four times a day (QID) | ORAL | Status: DC | PRN
  Administered 2023-07-12 – 2023-07-16 (×4): 4 mg via ORAL
  Filled 2023-07-11 (×4): qty 1

## 2023-07-11 MED ORDER — VENLAFAXINE HCL ER 150 MG PO CP24
150.0000 mg | ORAL_CAPSULE | Freq: Every day | ORAL | Status: DC
  Administered 2023-07-12 – 2023-07-16 (×5): 150 mg via ORAL
  Filled 2023-07-11 (×3): qty 1
  Filled 2023-07-11: qty 7
  Filled 2023-07-11 (×2): qty 1

## 2023-07-11 MED ORDER — IBUPROFEN 200 MG PO TABS
200.0000 mg | ORAL_TABLET | Freq: Four times a day (QID) | ORAL | Status: DC | PRN
  Administered 2023-07-12 – 2023-07-16 (×4): 200 mg via ORAL
  Filled 2023-07-11 (×4): qty 1

## 2023-07-11 MED ORDER — HYDROCHLOROTHIAZIDE 25 MG PO TABS
25.0000 mg | ORAL_TABLET | Freq: Every day | ORAL | Status: DC
Start: 2023-07-11 — End: 2023-07-16
  Administered 2023-07-11 – 2023-07-16 (×6): 25 mg via ORAL
  Filled 2023-07-11 (×6): qty 1

## 2023-07-11 MED ORDER — HYDROXYZINE HCL 25 MG PO TABS
25.0000 mg | ORAL_TABLET | Freq: Four times a day (QID) | ORAL | Status: DC | PRN
  Filled 2023-07-11: qty 1

## 2023-07-11 MED ORDER — BUSPIRONE HCL 10 MG PO TABS
10.0000 mg | ORAL_TABLET | Freq: Two times a day (BID) | ORAL | Status: DC
  Administered 2023-07-11 – 2023-07-16 (×10): 10 mg via ORAL
  Filled 2023-07-11 (×4): qty 2
  Filled 2023-07-11: qty 14
  Filled 2023-07-11 (×7): qty 2

## 2023-07-11 MED ORDER — DICYCLOMINE HCL 20 MG PO TABS: 20.0000 mg | ORAL_TABLET | Freq: Four times a day (QID) | ORAL | Status: DC | PRN

## 2023-07-11 MED ORDER — METHOCARBAMOL 500 MG PO TABS
500.0000 mg | ORAL_TABLET | Freq: Every day | ORAL | Status: DC | PRN
  Administered 2023-07-12 – 2023-07-13 (×2): 500 mg via ORAL
  Filled 2023-07-11 (×2): qty 1

## 2023-07-11 MED ORDER — LEVOTHYROXINE SODIUM 88 MCG PO TABS: 88.0000 ug | ORAL_TABLET | Freq: Every day | ORAL | Status: DC

## 2023-07-11 MED ORDER — VENLAFAXINE HCL ER 150 MG PO CP24
150.0000 mg | ORAL_CAPSULE | Freq: Every day | ORAL | Status: DC
  Administered 2023-07-11: 150 mg via ORAL
  Filled 2023-07-11: qty 1

## 2023-07-11 MED ORDER — VENLAFAXINE HCL ER 75 MG PO CP24: 75.0000 mg | ORAL_CAPSULE | Freq: Every day | ORAL | Status: DC

## 2023-07-11 MED ORDER — LEVOTHYROXINE SODIUM 100 MCG PO TABS
100.0000 ug | ORAL_TABLET | Freq: Every day | ORAL | Status: DC
  Administered 2023-07-12 – 2023-07-16 (×5): 100 ug via ORAL
  Filled 2023-07-11 (×5): qty 1

## 2023-07-11 MED ORDER — PANTOPRAZOLE SODIUM 40 MG PO TBEC
40.0000 mg | DELAYED_RELEASE_TABLET | Freq: Every day | ORAL | Status: DC
  Administered 2023-07-11 – 2023-07-16 (×6): 40 mg via ORAL
  Filled 2023-07-11 (×6): qty 1

## 2023-07-11 MED ORDER — LOPERAMIDE HCL 2 MG PO CAPS: 2.0000 mg | ORAL_CAPSULE | ORAL | Status: DC | PRN

## 2023-07-11 MED ORDER — LEVOTHYROXINE SODIUM 100 MCG PO TABS: 100.0000 ug | ORAL_TABLET | Freq: Every day | ORAL | Status: DC

## 2023-07-11 NOTE — ED Notes (Signed)
 Patient observed/assessed in patient room lying down asleep in bed. Patient aroused with verbal stimulation after several attempts to which patient responded in a slow demeanor. Patient oriented x 3. Affect is flat/agitated and eye contact is minimal. Patient evasive to preliminary assessment questions. Pt endorses S/I with plan to overdose and denies H/I. Denies A/V/H. Will continue to attempt to encourage patient to participate within the community and not solely isolate in room. Will continue to monitor/support

## 2023-07-11 NOTE — ED Notes (Addendum)
 Patient alert & oriented x4. Denies intent to harm others when asked, endorses thoughts of harming self. Patient voices a plan to OD on medications, denies any intent on following through with this plan at this time. Patient verbally contracts for safety. Denies A/VH. Patient reports generalized pain rating 10/10 but refuses medications at this time - states she takes oxycodone and that it and ibuprofen are the only things that help, provider Augusta Blizzard, MD made aware. Scheduled medications administered with no complications. Writer checked for pocketing after administration of medication due to patient reported plan to OD on medications. No medications visible in mouth after administration. No acute distress noted. Support and encouragement provided. Routine safety checks conducted per facility protocol. Encouraged patient to notify staff if patient develops intent to follow through with plan or if patient wants to actively harm themselves. Patient verbalizes understanding and agreement.

## 2023-07-11 NOTE — ED Notes (Signed)
 Patient is sleeping. Respirations equal and unlabored, skin warm and dry. No change in assessment or acuity. Routine safety checks conducted according to facility protocol. Will continue to monitor for safety.

## 2023-07-11 NOTE — ED Provider Notes (Cosign Needed Addendum)
 Facility Based Crisis Admission H&P  Date: 07/17/23 Patient Name: Alisha Webster MRN: [id number]  Diagnoses:  Final diagnoses:  MDD (major depressive disorder), recurrent severe, without psychosis (HCC)  Substance induced mood disorder (HCC)  Acquired hypothyroidism  Methamphetamine use disorder, severe (HCC)  Homelessness  Chronic pain syndrome     Alisha Webster is a 53 y.o. female with a documented PMH of MDD, GAD, methamphetamine use disorder, Sjogren's syndrome, hypothyroidism, who presented to [facility] (07/10/2023) as a voluntary walk-in to [facility] for suicidal ideation with plan to overdose.  HPI:  I have reviewed the PDMP during this encounter.  Patient reports she has recently been stressed due to being kicked out of her friend's apartment as they were at capacity.  She reports after being, she immediately felt suicidal and went to [facility] to get help. She reports underlying symptoms of GAD and major depressive disorder that have been managed by Almyra Jain, NP with Effexor  150 mg, buspirone  10 mg twice daily, and Abilify  5 mg.  She now endorses complaints of severe depression and anxiety, anhedonia, poor sleep, poor appetite, low energy, poor concentration, and suicidal ideation.  She denies present SI/HI/AVH and is able to contract for safety.  She reports that her suicidality is conditional upon housing stating that she just needs a "place to go".  She denies history of mania or psychosis. She denies history of trauma.  She endorses daily methamphetamine use for the past 6-7 years as well as abusing crack cocaine for the last weekend. She states she takes the rest of her medications as prescribed including oxycodone  by orthopedic surgeon.  She states she had taken oxycodone  yesterday morning but UDS shows positive only for amphetamine and cocaine and negative for opiates.  She denies alcohol use, nicotine  use, or any other illicit substance use. Patient amenable to looking into  residential treatment or [facility].    PHQ 2-9:    Flowsheet Row ED from 07/16/2023 in [facility] Emergency Department at [facility] Most recent reading at 07/16/2023 10:04 PM ED from 07/16/2023 in [facility] Emergency Department at [facility] Most recent reading at 07/16/2023  2:06 PM ED from 07/10/2023 in [facility] Most recent reading at 07/10/2023  7:11 PM  C-SSRS RISK CATEGORY High Risk High Risk High Risk      Total Time spent with patient: 1 hour  Past Psychiatric History:  Diagnoses: MDD, GAD Current psychotropic meds: effexor , abilify , buspar  Previous psychotropic meds: gabapentin  Psychiatrist/therapist: Almyra Jain, NP Hospitalizations: multiple for SI Suicide attempts: multiple SIB: endorses   Substance Use History (onset, amount, frequency, most recent use, period of sobriety):  Tobacco: denies Alcohol: denies Marijuana: denies Stimulant: endorses methamphetamine use for past 6-7 years and crack cocaine abuse Opioid: prescribed but history of fentanyl abuse Benzo: denies  Rehab history: denies  Past Medical History: Dx:  has a past medical history of Anxiety, Back pain, Depression, Hyperlipidemia, and Hypertension.  Allergies: Deltasone [prednisone], Lopid [gemfibrozil], Norvasc  [amlodipine ], Reglan  [metoclopramide ], Statins, Tylenol  [acetaminophen ], and Zestril [lisinopril]    Social History: Housing: homeless Occupation: unemployed, seeking disability for 1.5 years  Musculoskeletal  Strength & Muscle Tone: within normal limits Gait & Station: normal Patient leans: N/A Strength & Muscle Tone: within normal limits Gait & Station: normal Patient leans: N/A  Psychiatric Specialty Exam  Presentation General Appearance:Appropriate for Environment, Disheveled Eye Contact:Fair Speech:Clear and Coherent Volume:Normal Handedness:Right  Mood and Affect  Mood:Irritable Affect:Congruent,  Inappropriate  Thought Process  Thought Process:Linear,  Goal Directed Descriptions of Associations:Intact  Thought Content Suicidal Thoughts:Yes, Active Homicidal Thoughts:No Hallucinations:None Ideas of Reference:None Thought Content:Logical  Sensorium  Memory:Remote Good Judgment:Poor Insight:Poor  Executive Functions  Orientation:Full (Time, Place and Person) Language:Fair Concentration:Poor Attention:Poor Recall:Fair Fund of Knowledge:Fair  Psychomotor Activity  Psychomotor Activity:Psychomotor Activity: Normal  Assets  Assets:Resilience  Sleep  Quality:Fair  Nutritional Assessment (For OBS and [facility]BC admissions only)    Has the patient had a weight loss or gain of 10 pounds or more in the last 3 months? No  Has the patient had a decrease in food intake/or appetite? No  Does the patient have dental problems? No  Does the patient have eating habits or behaviors that may be indicators of an eating disorder including binging or inducing vomiting? No  Has the patient recently lost weight without trying? No  Has the patient been eating poorly because of a decreased appetite? No  Malnutrition Screening Tool Score 0    Physical Exam Blood pressure (!) 142/84, pulse 76, temperature 97.9 F (36.6 C), temperature source Oral, resp. rate 20, SpO2 99%. There is no height or weight on file to calculate BMI.   Is the patient at risk to self? Yes  Has the patient been a risk to self in the past 6 months? Yes .    Has the patient been a risk to self within the distant past? Yes   Is the patient a risk to others? No   Has the patient been a risk to others in the past 6 months? No   Has the patient been a risk to others within the distant past? No   Last Labs:  Admission on 07/16/2023  Component Date Value Ref Range Status   Sodium 07/16/2023 136  135 - 145 mmol/L Final   Potassium 07/16/2023 3.0 (L)  3.5 - 5.1 mmol/L Final   Chloride 07/16/2023 98  98 - 111 mmol/L Final    CO2 07/16/2023 28  22 - 32 mmol/L Final   Glucose, Bld 07/16/2023 169 (H)  70 - 99 mg/dL Final   BUN 16/12/9602 29 (H)  6 - 20 mg/dL Final   Creatinine, Ser 07/16/2023 1.13 (H)  0.44 - 1.00 mg/dL Final   Calcium 54/11/8117 9.0  8.9 - 10.3 mg/dL Final   Total Protein 14/78/2956 6.8  6.5 - 8.1 g/dL Final   Albumin 21/30/8657 3.5  3.5 - 5.0 g/dL Final   AST 84/69/6295 40  15 - 41 U/L Final   ALT 07/16/2023 39  0 - 44 U/L Final   Alkaline Phosphatase 07/16/2023 84  38 - 126 U/L Final   Total Bilirubin 07/16/2023 0.6  0.0 - 1.2 mg/dL Final   GFR, Estimated 07/16/2023 59 (L)  >60 mL/min Final   Anion gap 07/16/2023 10  5 - 15 Final   Alcohol, Ethyl (B) 07/16/2023 <10  <10 mg/dL Final   Opiates 28/41/3244 NONE DETECTED  NONE DETECTED Final   Cocaine 07/17/2023 NONE DETECTED  NONE DETECTED Final   Benzodiazepines 07/17/2023 NONE DETECTED  NONE DETECTED Final   Amphetamines 07/17/2023 NONE DETECTED  NONE DETECTED Final   Tetrahydrocannabinol 07/17/2023 NONE DETECTED  NONE DETECTED Final   Barbiturates 07/17/2023 NONE DETECTED  NONE DETECTED Final   WBC 07/16/2023 7.8  4.0 - 10.5 K/uL Final   RBC 07/16/2023 4.68  3.87 - 5.11 MIL/uL Final   Hemoglobin 07/16/2023 13.6  12.0 - 15.0 g/dL Final   HCT 04/01/7251 42.0  36.0 - 46.0 % Final   MCV 07/16/2023  89.7  80.0 - 100.0 fL Final   MCH 07/16/2023 29.1  26.0 - 34.0 pg Final   MCHC 07/16/2023 32.4  30.0 - 36.0 g/dL Final   RDW 16/12/9602 12.9  11.5 - 15.5 % Final   Platelets 07/16/2023 356  150 - 400 K/uL Final   nRBC 07/16/2023 0.0  0.0 - 0.2 % Final   Neutrophils Relative % 07/16/2023 44  % Final   Neutro Abs 07/16/2023 3.4  1.7 - 7.7 K/uL Final   Lymphocytes Relative 07/16/2023 45  % Final   Lymphs Abs 07/16/2023 3.5  0.7 - 4.0 K/uL Final   Monocytes Relative 07/16/2023 7  % Final   Monocytes Absolute 07/16/2023 0.5  0.1 - 1.0 K/uL Final   Eosinophils Relative 07/16/2023 3  % Final   Eosinophils Absolute 07/16/2023 0.2  0.0 - 0.5 K/uL  Final   Basophils Relative 07/16/2023 1  % Final   Basophils Absolute 07/16/2023 0.1  0.0 - 0.1 K/uL Final   Immature Granulocytes 07/16/2023 0  % Final   Abs Immature Granulocytes 07/16/2023 0.02  0.00 - 0.07 K/uL Final   Salicylate Lvl 07/16/2023 <7.0 (L)  7.0 - 30.0 mg/dL Final   Acetaminophen  (Tylenol ), Serum 07/16/2023 <10 (L)  10 - 30 ug/mL Final   Magnesium  07/17/2023 1.9  1.7 - 2.4 mg/dL Final  Admission on 54/11/8117, Discharged on 07/16/2023  Component Date Value Ref Range Status   Sodium 07/16/2023 138  135 - 145 mmol/L Final   Potassium 07/16/2023 3.9  3.5 - 5.1 mmol/L Final   Chloride 07/16/2023 100  98 - 111 mmol/L Final   CO2 07/16/2023 23  22 - 32 mmol/L Final   Glucose, Bld 07/16/2023 99  70 - 99 mg/dL Final   BUN 14/78/2956 33 (H)  6 - 20 mg/dL Final   Creatinine, Ser 07/16/2023 0.63  0.44 - 1.00 mg/dL Final   Calcium 21/30/8657 9.2  8.9 - 10.3 mg/dL Final   Total Protein 84/69/6295 7.0  6.5 - 8.1 g/dL Final   Albumin 28/41/3244 3.8  3.5 - 5.0 g/dL Final   AST 04/01/7251 50 (H)  15 - 41 U/L Final   ALT 07/16/2023 41  0 - 44 U/L Final   Alkaline Phosphatase 07/16/2023 87  38 - 126 U/L Final   Total Bilirubin 07/16/2023 0.8  0.0 - 1.2 mg/dL Final   GFR, Estimated 07/16/2023 >60  >60 mL/min Final   Anion gap 07/16/2023 15  5 - 15 Final   Color, Urine 07/16/2023 YELLOW  YELLOW Final   APPearance 07/16/2023 CLOUDY (A)  CLEAR Final   Specific Gravity, Urine 07/16/2023 1.020  1.005 - 1.030 Final   pH 07/16/2023 6.0  5.0 - 8.0 Final   Glucose, UA 07/16/2023 NEGATIVE  NEGATIVE mg/dL Final   Hgb urine dipstick 07/16/2023 NEGATIVE  NEGATIVE Final   Bilirubin Urine 07/16/2023 NEGATIVE  NEGATIVE Final   Ketones, ur 07/16/2023 NEGATIVE  NEGATIVE mg/dL Final   Protein, ur 66/44/0347 30 (A)  NEGATIVE mg/dL Final   Nitrite 42/59/5638 NEGATIVE  NEGATIVE Final   Leukocytes,Ua 07/16/2023 TRACE (A)  NEGATIVE Final   RBC / HPF 07/16/2023 0-5  0 - 5 RBC/hpf Final   WBC, UA 07/16/2023  6-10  0 - 5 WBC/hpf Final   Bacteria, UA 07/16/2023 RARE (A)  NONE SEEN Final   Squamous Epithelial / HPF 07/16/2023 21-50  0 - 5 /HPF Final   Mucus 07/16/2023 PRESENT   Final   Hyaline Casts, UA 07/16/2023 PRESENT  Final   Acetaminophen  (Tylenol ), Serum 07/16/2023 <10 (L)  10 - 30 ug/mL Final   Opiates 07/16/2023 NONE DETECTED  NONE DETECTED Final   Cocaine 07/16/2023 NONE DETECTED  NONE DETECTED Final   Benzodiazepines 07/16/2023 NONE DETECTED  NONE DETECTED Final   Amphetamines 07/16/2023 NONE DETECTED  NONE DETECTED Final   Tetrahydrocannabinol 07/16/2023 NONE DETECTED  NONE DETECTED Final   Barbiturates 07/16/2023 NONE DETECTED  NONE DETECTED Final   Alcohol, Ethyl (B) 07/16/2023 <10  <10 mg/dL Final   Salicylate Lvl 07/16/2023 <7.0 (L)  7.0 - 30.0 mg/dL Final   Magnesium  07/16/2023 1.8  1.7 - 2.4 mg/dL Final   WBC 40/12/2723 8.6  4.0 - 10.5 K/uL Final   RBC 07/16/2023 4.67  3.87 - 5.11 MIL/uL Final   Hemoglobin 07/16/2023 13.8  12.0 - 15.0 g/dL Final   HCT 36/64/4034 41.9  36.0 - 46.0 % Final   MCV 07/16/2023 89.7  80.0 - 100.0 fL Final   MCH 07/16/2023 29.6  26.0 - 34.0 pg Final   MCHC 07/16/2023 32.9  30.0 - 36.0 g/dL Final   RDW 74/25/9563 12.8  11.5 - 15.5 % Final   Platelets 07/16/2023 345  150 - 400 K/uL Final   nRBC 07/16/2023 0.0  0.0 - 0.2 % Final   Neutrophils Relative % 07/16/2023 50  % Final   Neutro Abs 07/16/2023 4.3  1.7 - 7.7 K/uL Final   Lymphocytes Relative 07/16/2023 39  % Final   Lymphs Abs 07/16/2023 3.4  0.7 - 4.0 K/uL Final   Monocytes Relative 07/16/2023 8  % Final   Monocytes Absolute 07/16/2023 0.7  0.1 - 1.0 K/uL Final   Eosinophils Relative 07/16/2023 2  % Final   Eosinophils Absolute 07/16/2023 0.2  0.0 - 0.5 K/uL Final   Basophils Relative 07/16/2023 1  % Final   Basophils Absolute 07/16/2023 0.1  0.0 - 0.1 K/uL Final   Immature Granulocytes 07/16/2023 0  % Final   Abs Immature Granulocytes 07/16/2023 0.03  0.00 - 0.07 K/uL Final   Troponin I  (High Sensitivity) 07/16/2023 5  <18 ng/L Final  Admission on 07/10/2023, Discharged on 07/16/2023  Component Date Value Ref Range Status   Sodium 07/11/2023 139  135 - 145 mmol/L Final   Potassium 07/11/2023 3.6  3.5 - 5.1 mmol/L Final   Chloride 07/11/2023 99  98 - 111 mmol/L Final   CO2 07/11/2023 28  22 - 32 mmol/L Final   Glucose, Bld 07/11/2023 103 (H)  70 - 99 mg/dL Final   BUN 87/56/4332 15  6 - 20 mg/dL Final   Creatinine, Ser 07/11/2023 0.68  0.44 - 1.00 mg/dL Final   Calcium 95/18/8416 9.2  8.9 - 10.3 mg/dL Final   GFR, Estimated 07/11/2023 >60  >60 mL/min Final   Anion gap 07/11/2023 12  5 - 15 Final   Magnesium  07/11/2023 2.0  1.7 - 2.4 mg/dL Final   Cholesterol 60/63/0160 226 (H)  0 - 200 mg/dL Final   Triglycerides 10/93/2355 141  <150 mg/dL Final   HDL 73/22/0254 49  >40 mg/dL Final   Total CHOL/HDL Ratio 07/14/2023 4.6  RATIO Final   VLDL 07/14/2023 28  0 - 40 mg/dL Final   LDL Cholesterol 07/14/2023 149 (H)  0 - 99 mg/dL Final  Admission on 27/08/2374, Discharged on 07/10/2023  Component Date Value Ref Range Status   Sodium 07/10/2023 138  135 - 145 mmol/L Final   Potassium 07/10/2023 2.9 (L)  3.5 - 5.1 mmol/L Final  Chloride 07/10/2023 100  98 - 111 mmol/L Final   CO2 07/10/2023 28  22 - 32 mmol/L Final   Glucose, Bld 07/10/2023 131 (H)  70 - 99 mg/dL Final   BUN 78/29/5621 14  6 - 20 mg/dL Final   Creatinine, Ser 07/10/2023 0.81  0.44 - 1.00 mg/dL Final   Calcium 30/86/5784 8.7 (L)  8.9 - 10.3 mg/dL Final   Total Protein 69/62/9528 6.9  6.5 - 8.1 g/dL Final   Albumin 41/32/4401 3.5  3.5 - 5.0 g/dL Final   AST 02/72/5366 39  15 - 41 U/L Final   ALT 07/10/2023 31  0 - 44 U/L Final   Alkaline Phosphatase 07/10/2023 86  38 - 126 U/L Final   Total Bilirubin 07/10/2023 0.5  0.0 - 1.2 mg/dL Final   GFR, Estimated 07/10/2023 >60  >60 mL/min Final   Anion gap 07/10/2023 10  5 - 15 Final   Alcohol, Ethyl (B) 07/10/2023 <10  <10 mg/dL Final   Opiates 44/05/4740 NONE  DETECTED  NONE DETECTED Final   Cocaine 07/10/2023 POSITIVE (A)  NONE DETECTED Final   Benzodiazepines 07/10/2023 NONE DETECTED  NONE DETECTED Final   Amphetamines 07/10/2023 POSITIVE (A)  NONE DETECTED Final   Tetrahydrocannabinol 07/10/2023 NONE DETECTED  NONE DETECTED Final   Barbiturates 07/10/2023 NONE DETECTED  NONE DETECTED Final   WBC 07/10/2023 6.9  4.0 - 10.5 K/uL Final   RBC 07/10/2023 4.50  3.87 - 5.11 MIL/uL Final   Hemoglobin 07/10/2023 13.2  12.0 - 15.0 g/dL Final   HCT 59/56/3875 39.6  36.0 - 46.0 % Final   MCV 07/10/2023 88.0  80.0 - 100.0 fL Final   MCH 07/10/2023 29.3  26.0 - 34.0 pg Final   MCHC 07/10/2023 33.3  30.0 - 36.0 g/dL Final   RDW 64/33/2951 12.9  11.5 - 15.5 % Final   Platelets 07/10/2023 323  150 - 400 K/uL Final   nRBC 07/10/2023 0.0  0.0 - 0.2 % Final   Neutrophils Relative % 07/10/2023 71  % Final   Neutro Abs 07/10/2023 4.9  1.7 - 7.7 K/uL Final   Lymphocytes Relative 07/10/2023 22  % Final   Lymphs Abs 07/10/2023 1.5  0.7 - 4.0 K/uL Final   Monocytes Relative 07/10/2023 4  % Final   Monocytes Absolute 07/10/2023 0.3  0.1 - 1.0 K/uL Final   Eosinophils Relative 07/10/2023 2  % Final   Eosinophils Absolute 07/10/2023 0.1  0.0 - 0.5 K/uL Final   Basophils Relative 07/10/2023 1  % Final   Basophils Absolute 07/10/2023 0.1  0.0 - 0.1 K/uL Final   Immature Granulocytes 07/10/2023 0  % Final   Abs Immature Granulocytes 07/10/2023 0.03  0.00 - 0.07 K/uL Final  Admission on 04/05/2023, Discharged on 04/05/2023  Component Date Value Ref Range Status   Sodium 04/05/2023 137  135 - 145 mmol/L Final   Potassium 04/05/2023 3.7  3.5 - 5.1 mmol/L Final   Chloride 04/05/2023 104  98 - 111 mmol/L Final   CO2 04/05/2023 22  22 - 32 mmol/L Final   Glucose, Bld 04/05/2023 110 (H)  70 - 99 mg/dL Final   BUN 88/41/6606 25 (H)  6 - 20 mg/dL Final   Creatinine, Ser 04/05/2023 0.85  0.44 - 1.00 mg/dL Final   Calcium 30/16/0109 9.0  8.9 - 10.3 mg/dL Final   GFR,  Estimated 04/05/2023 >60  >60 mL/min Final   Anion gap 04/05/2023 11  5 - 15 Final   Troponin I (  High Sensitivity) 04/05/2023 5  <18 ng/L Final   WBC 04/05/2023 9.3  4.0 - 10.5 K/uL Final   RBC 04/05/2023 4.40  3.87 - 5.11 MIL/uL Final   Hemoglobin 04/05/2023 13.4  12.0 - 15.0 g/dL Final   HCT 09/81/1914 39.2  36.0 - 46.0 % Final   MCV 04/05/2023 89.1  80.0 - 100.0 fL Final   MCH 04/05/2023 30.5  26.0 - 34.0 pg Final   MCHC 04/05/2023 34.2  30.0 - 36.0 g/dL Final   RDW 78/29/5621 12.3  11.5 - 15.5 % Final   Platelets 04/05/2023 378  150 - 400 K/uL Final   nRBC 04/05/2023 0.0  0.0 - 0.2 % Final   Magnesium  04/05/2023 1.8  1.7 - 2.4 mg/dL Final   B Natriuretic Peptide 04/05/2023 68.1  0.0 - 100.0 pg/mL Final   Troponin I (High Sensitivity) 04/05/2023 5  <18 ng/L Final  Allergies: Deltasone [prednisone], Lopid [gemfibrozil], Norvasc  [amlodipine ], Reglan  [metoclopramide ], Statins, Tylenol  [acetaminophen ], and Zestril [lisinopril] Medications: See below  Long Term Goals: Improvement in symptoms so as ready for discharge   Short Term Goals: Patient will verbalize feelings in meetings with treatment team members., Patient will attend at least of 50% of the groups daily., Pt will complete the PHQ9 on admission, day 3 and discharge., Patient will participate in completing the [place] Suicide Severity Rating Scale, Patient will score a low risk of violence for 24 hours prior to discharge, and Patient will take medications as prescribed daily.  Medical Decision Making  Active Problems:   Hypothyroidism   MDD (major depressive disorder), recurrent severe, without psychosis (HCC)   Substance induced mood disorder (HCC)   Chronic pain syndrome   Homelessness   Malingering  Patient presents with conditional SI secondary to homelessness. She perseverates on homelessness and this seems to be her chief complaint at this time. There is concern for drug diversion as patient has been negative for opiates  in yesterday's UDS which she claims she took oxycodone  yesterday morning and her UDS done on 06/30/23 was also negative for opiates (as well as all other substances). She is likely not a good candidate for opiates for pain management especially if she is not using appropriately. She also seems to struggle with cocaine abuse and methamphetamine use disorder.   Given these concerns, I will not be continuing her oxycodone  and will monitor for opiate withdrawal with COWS. While symptoms are unpleasant, they are not lethal and we will manage her withdrawal symptomatically should they present. She can be put on clonidine PRN for opiate withdrawal as well. She would strongly benefit from sober living housing or residential treatment. We will restart her psychotropics although there does not appear to be any indications for her symptoms.   Stimulant Use Disorder, methamphetamine Cocaine abuse MDD GAD Substance induced mood Disorder Housing Instability Concern for drug diversion -COWS  -clonidine can be started PRN for opiate withdrawal  -PRNs added to address symptomatic opiate withdrawal symptoms -Restart buspirone  10 mg twice daily for anxiety -Restart Effexor  150 mg daily -Restart Abilify  5 mg daily  Hypothyroidism-restart home Synthroid  100 mcg Chronic pain syndrome-Ibuprofen  and tylenol  GERD-restart home Protonix  40 mg daily Hypertension-restart HCTZ 25 mg daily   Status: Voluntary     Recommendations  Based on my evaluation the patient does not appear to have an emergency medical condition.    Augusta Blizzard, MD Psych Resident, PGY-3 07/17/23  1:16 PM .

## 2023-07-11 NOTE — Group Note (Signed)
 Group Topic: Decisional Balance/Substance Abuse  Group Date: 07/11/2023 Start Time: 1930 End Time: 2000 Facilitators: Renetta Carter, NT  Department: [facility]  Number of Participants: 5  Group Focus: coping skills Treatment Modality:  Skills Training Interventions utilized were group exercise Purpose: express feelings  Name: Alisha Webster Date of Birth: [birth year]  MR: [id number]    Level of Participation: pt did not attend group Quality of Participation:  pt did not attend group Interactions with others:  pt did not attend group Mood/Affect:  pt did not attend group Triggers (if applicable):  pt did not attend group Cognition:  pt did not attend group Progress:  pt did not attend group Response:  pt did not attend group Plan:  pt did not attend group  Patients Problems:  Patient Active Problem List   Diagnosis Date Noted   Methamphetamine use disorder, severe (HCC) 07/11/2023   Chronic pain syndrome 07/11/2023   Homelessness 07/11/2023   Suicidal ideation 07/10/2023   Substance induced mood disorder (HCC) 07/10/2023   GAD (generalized anxiety disorder) 11/24/2022   MDD (major depressive disorder) 11/23/2022   MDD (major depressive disorder), recurrent severe, without psychosis (HCC) 11/23/2022   AKI (acute kidney injury) (HCC) 11/19/2022   Anxiety and depression 11/19/2022   Hypothyroidism 11/19/2022   Sjogren's disease (HCC) 11/19/2022   Syncope 01/25/2013   Hypertension 09/02/2011   Depression 09/02/2011   Hyperlipidemia 09/02/2011   Abdominal pain 09/02/2011

## 2023-07-11 NOTE — ED Notes (Signed)
 Patient resting with eyes closed in no apparent acute distress. Respirations even and unlabored. Environment secured. Safety checks in place according to facility policy.

## 2023-07-11 NOTE — Group Note (Signed)
 Group Topic: Wellness  Group Date: 07/11/2023 Start Time: 1000 End Time: 1030 Facilitators: Dee Farber D, NT  Department: [facility]  Number of Participants: 10  Group Focus: relapse prevention, relaxation, self-awareness, social skills, and substance abuse education Treatment Modality:  Psychoeducation Interventions utilized were support Purpose: enhance coping skills, increase insight, reinforce self-care, and relapse prevention strategies  Name: Alisha Webster Date of Birth: 1971-03-15  MR: [id number]    Level of Participation: moderate Quality of Participation: attentive Interactions with others: gave feedback Mood/Affect: appropriate Triggers (if applicable): N/A Cognition: concrete Progress: Significant Response: N/A Plan: follow-up needed  Patients Problems:  Patient Active Problem List   Diagnosis Date Noted   Suicidal ideation 07/10/2023   Substance induced mood disorder (HCC) 07/10/2023   GAD (generalized anxiety disorder) 11/24/2022   MDD (major depressive disorder) 11/23/2022   MDD (major depressive disorder), recurrent severe, without psychosis (HCC) 11/23/2022   AKI (acute kidney injury) (HCC) 11/19/2022   Anxiety and depression 11/19/2022   Hypothyroidism 11/19/2022   Sjogren's disease (HCC) 11/19/2022   Syncope 01/25/2013   Hypertension 09/02/2011   Depression 09/02/2011   Hyperlipidemia 09/02/2011   Abdominal pain 09/02/2011

## 2023-07-12 MED ORDER — ENSURE ENLIVE PO LIQD
237.0000 mL | Freq: Two times a day (BID) | ORAL | Status: DC
  Administered 2023-07-12 – 2023-07-16 (×8): 237 mL via ORAL
  Filled 2023-07-12 (×2): qty 237

## 2023-07-12 NOTE — Group Note (Signed)
 Group Topic: Change and Accountability AA Group Meeting Group Date: 07/12/2023 Start Time: 1015 End Time: 1055 Facilitators: Brantley Caldwell B  Department: [facility]  Number of Participants: 6  Group Focus: daily focus and self-awareness Treatment Modality:  Psychoeducation Interventions utilized were patient education and problem solving Purpose: relapse prevention strategies  Name: Alisha Webster Date of Birth: May 21, 1970  MR: [id number]    Level of Participation: she did not attend group Quality of Participation:  Interactions with others:  Mood/Affect:  Triggers (if applicable):  Cognition:  Progress:  Response:  Plan: follow-up needed  Patients Problems:  Patient Active Problem List   Diagnosis Date Noted   Methamphetamine use disorder, severe (HCC) 07/11/2023   Chronic pain syndrome 07/11/2023   Homelessness 07/11/2023   Suicidal ideation 07/10/2023   Substance induced mood disorder (HCC) 07/10/2023   GAD (generalized anxiety disorder) 11/24/2022   MDD (major depressive disorder) 11/23/2022   MDD (major depressive disorder), recurrent severe, without psychosis (HCC) 11/23/2022   AKI (acute kidney injury) (HCC) 11/19/2022   Anxiety and depression 11/19/2022   Hypothyroidism 11/19/2022   Sjogren's disease (HCC) 11/19/2022   Syncope 01/25/2013   Hypertension 09/02/2011   Depression 09/02/2011   Hyperlipidemia 09/02/2011   Abdominal pain 09/02/2011

## 2023-07-12 NOTE — ED Notes (Signed)
 Pt refused vitals

## 2023-07-12 NOTE — ED Notes (Signed)
 Patient is sleeping. Respirations equal and unlabored, skin warm and dry. No change in assessment or acuity. Routine safety checks conducted according to facility protocol. Will continue to monitor for safety.

## 2023-07-12 NOTE — Group Note (Signed)
 Group Topic: Relapse and Recovery  Group Date: 07/12/2023 Start Time: 1300 End Time: 1330 Facilitators: Arlan Belling, RN  Department: [facility]  Number of Participants: 7  Group Focus: chemical dependency education and chemical dependency issues Treatment Modality:  Behavior Modification Therapy Interventions utilized were clarification, confrontation, exploration, group exercise, and patient education Purpose: explore maladaptive thinking, express feelings, express irrational fears, improve communication skills, increase insight, regain self-worth, and reinforce self-care   Name: Alisha Webster Date of Birth: [birth year]  MR: [id number]    Level of Participation: did not attend Quality of Participation:  Interactions with others:  Mood/Affect:  Triggers (if applicable):  Cognition:  Progress:  Response:  Plan:   Patients Problems:  Patient Active Problem List   Diagnosis Date Noted   Methamphetamine use disorder, severe (HCC) 07/11/2023   Chronic pain syndrome 07/11/2023   Homelessness 07/11/2023   Suicidal ideation 07/10/2023   Substance induced mood disorder (HCC) 07/10/2023   GAD (generalized anxiety disorder) 11/24/2022   MDD (major depressive disorder) 11/23/2022   MDD (major depressive disorder), recurrent severe, without psychosis (HCC) 11/23/2022   AKI (acute kidney injury) (HCC) 11/19/2022   Anxiety and depression 11/19/2022   Hypothyroidism 11/19/2022   Sjogren's disease (HCC) 11/19/2022   Syncope 01/25/2013   Hypertension 09/02/2011   Depression 09/02/2011   Hyperlipidemia 09/02/2011   Abdominal pain 09/02/2011

## 2023-07-12 NOTE — ED Notes (Signed)
 Pt is in his room resting in bed. Pt denies SI/HI/AVH. Pt remain in bed, writer encouraged pt to come out out her from but  No acute distress noted. Will continue to monitor for safety.Pt is in his room resting in bed. Pt denies SI/HI/AVH. No acute distress noted. Will continue to monitor for safety.

## 2023-07-12 NOTE — ED Notes (Signed)
 Pt complained of nausea and general body pains. Medication administered to that effect. Will continue to monitor.

## 2023-07-12 NOTE — ED Notes (Signed)
 Patient has not gotten up out of bed all day despite encouragement to do so.  Patient refused dinner.  Will monitor.

## 2023-07-12 NOTE — ED Provider Notes (Signed)
 Behavioral Health Progress Note  Date and Time: 07/12/2023 11:34 AM Name: Alisha Webster MRN:  [id number]  Alisha Webster is a 53 y.o. female with a past psychiatric history of MDD, GAD, and methamphetamine use disorder and past medical history of Sjogren's syndrome, hypothyroidism, and hypertension who presented to [facility] from voluntary walk-in at [facility] for suicidal ideation with plan to overdose.   Subjective:   The patient was seen and assessed in her room. On interview, the patient reported feeling okay, primarily because she had been sleeping since admission. She reported that she has not been able to do much over the last 24 hours besides sleep. She denied attending any groups or doing any activities outside of her current need for sleep. She reported sleeping well, but stated that she has awakened 2-3 times throughout the night with episodes of palpitations, sweating, and anxiety. She reported not having had much of an appetite and not hydrating well, stating she only had as much water as the nurses provided with her medications. She reported her anxiety and depression levels as 10/10 and 9/10, respectively (with 10 being the most severe). She reported her energy levels as 0/10 and pain as 10/10 in her lower back and bilateral knees (with 10 being the most for both). She endorsed thoughts of active SI with plans to overdose. She contracts for safety and denies wanting to act on SI in her room. She denied having any current means or attempts since being hospitalized. When I asked her what her stressors are, she states "I feel suicidal ever since I was homeless". She denied any HI or AVH and was not responding to internal stimuli during the interview. She continued to endorse cravings for methamphetamine, but denied cravings for any other substances. She reported stimulant withdrawal symptoms consisting of anxiety, depression, hypersomnia, headaches, and fatigue. She any opioid withdrawal symptoms. She  denied feeling any beneficial effects from her medication regimen and also denied experiencing any side effects. She reported not having any plans for herself upon discharge, but felt like she did not belong in here. She reports "I dont want to go to a shelter" and when I discussed the different treatment options, she seemed open-minded to residential treatment after detox.   Diagnosis:  Final diagnoses:  MDD (major depressive disorder), recurrent severe, without psychosis (HCC)  Substance induced mood disorder (HCC)  Acquired hypothyroidism  Methamphetamine use disorder, severe (HCC)  Homelessness  Chronic pain syndrome    Total Time spent with patient: 20 minutes  (Pertinent past psychiatric, medical, family, and social history obtained from patient's H&P.)  Past Psychiatric History:  Diagnoses: MDD, GAD Current psychotropic meds: effexor, abilify, buspar Previous psychotropic meds: gabapentin Psychiatrist/therapist: Barbette Merino, NP Hospitalizations: multiple for SI Suicide attempts: multiple SIB: endorses   Past Medical History:  Dx:  has a past medical history of Anxiety, Back pain, Depression, Hyperlipidemia, and Hypertension.  Allergies: Deltasone [prednisone], Lopid [gemfibrozil], Norvasc [amlodipine], Reglan [metoclopramide], Statins, Tylenol [acetaminophen], and Zestril [lisinopril]   Family History: No relevant family history was noted in patient's H&P. Family Psychiatric  History: No relevant family history was noted in patient's H&P.  Social History:  Substance Use History: Tobacco: denies Alcohol: denies Marijuana: denies Stimulant: endorses methamphetamine use for past 6-7 years and crack cocaine abuse Opioid: prescribed but history of fentanyl abuse Benzo: denies Rehab history: denies  Additional Social History:  Sleep: Good  Appetite:  Poor  Current Medications:  Current Facility-Administered Medications   Medication Dose Route Frequency Provider Last Rate Last Admin   ARIPiprazole (ABILIFY) tablet 5 mg  5 mg Oral Daily Oneta Rack, NP   5 mg at 07/12/23 1610   busPIRone (BUSPAR) tablet 10 mg  10 mg Oral BID Park Pope, MD   10 mg at 07/12/23 9604   dicyclomine (BENTYL) tablet 20 mg  20 mg Oral Q6H PRN Park Pope, MD       haloperidol (HALDOL) tablet 5 mg  5 mg Oral TID PRN Oneta Rack, NP       And   diphenhydrAMINE (BENADRYL) capsule 50 mg  50 mg Oral TID PRN Oneta Rack, NP       haloperidol lactate (HALDOL) injection 5 mg  5 mg Intramuscular TID PRN Oneta Rack, NP       And   diphenhydrAMINE (BENADRYL) injection 50 mg  50 mg Intramuscular TID PRN Oneta Rack, NP       And   LORazepam (ATIVAN) injection 2 mg  2 mg Intramuscular TID PRN Oneta Rack, NP       haloperidol lactate (HALDOL) injection 10 mg  10 mg Intramuscular TID PRN Oneta Rack, NP       And   diphenhydrAMINE (BENADRYL) injection 50 mg  50 mg Intramuscular TID PRN Oneta Rack, NP       And   LORazepam (ATIVAN) injection 2 mg  2 mg Intramuscular TID PRN Oneta Rack, NP       hydrochlorothiazide (HYDRODIURIL) tablet 25 mg  25 mg Oral Daily Park Pope, MD   25 mg at 07/12/23 5409   hydrOXYzine (ATARAX) tablet 25 mg  25 mg Oral Q6H PRN Park Pope, MD       ibuprofen (ADVIL) tablet 200 mg  200 mg Oral Q6H PRN Park Pope, MD       levothyroxine (SYNTHROID) tablet 100 mcg  100 mcg Oral Daily Park Pope, MD   100 mcg at 07/12/23 8119   loperamide (IMODIUM) capsule 2-4 mg  2-4 mg Oral PRN Park Pope, MD       methocarbamol (ROBAXIN) tablet 500 mg  500 mg Oral Daily PRN Park Pope, MD       naproxen (NAPROSYN) tablet 500 mg  500 mg Oral BID PRN Park Pope, MD       ondansetron (ZOFRAN-ODT) disintegrating tablet 4 mg  4 mg Oral Q6H PRN Park Pope, MD       pantoprazole (PROTONIX) EC tablet 40 mg  40 mg Oral Daily Park Pope, MD   40 mg at 07/12/23 0939   venlafaxine XR (EFFEXOR-XR) 24 hr capsule  150 mg  150 mg Oral Daily Park Pope, MD   150 mg at 07/12/23 1478   Current Outpatient Medications  Medication Sig Dispense Refill   gabapentin (NEURONTIN) 600 MG tablet Take 600 mg by mouth 3 (three) times daily.     venlafaxine XR (EFFEXOR-XR) 150 MG 24 hr capsule Take 150 mg by mouth daily.     ARIPiprazole (ABILIFY) 5 MG tablet Take 5 mg by mouth daily.     busPIRone (BUSPAR) 10 MG tablet Take 10 mg by mouth 2 (two) times daily.     hydrochlorothiazide (HYDRODIURIL) 25 MG tablet Take 25 mg by mouth daily.     ibuprofen (ADVIL) 200 MG tablet Take 800 mg by mouth 2 (two) times daily.  levETIRAcetam (KEPPRA) 1000 MG tablet Take 1,000 mg by mouth 2 (two) times daily.     levothyroxine (SYNTHROID) 88 MCG tablet Take 88 mcg by mouth daily.     magnesium oxide (MAG-OX) 400 (240 Mg) MG tablet Take 400 mg by mouth daily.     methocarbamol (ROBAXIN) 500 MG tablet Take 1 tablet (500 mg total) by mouth 2 (two) times daily. (Patient taking differently: Take 500 mg by mouth daily as needed for muscle spasms.) 20 tablet 0   omeprazole (PRILOSEC) 20 MG capsule Take 20 mg by mouth daily.     oxyCODONE HCl 10 MG TABA Take 10 mg by mouth 3 (three) times daily.     potassium chloride SA (KLOR-CON M) 20 MEQ tablet Take 1 tablet (20 mEq total) by mouth daily. (Patient not taking: Reported on 07/10/2023) 7 tablet 0   Vitamin D, Ergocalciferol, (DRISDOL) 1.25 MG (50000 UNIT) CAPS capsule Take 50,000 Units by mouth every Monday.      Labs  Lab Results:  Admission on 07/10/2023  Component Date Value Ref Range Status   Sodium 07/11/2023 139  135 - 145 mmol/L Final   Potassium 07/11/2023 3.6  3.5 - 5.1 mmol/L Final   Chloride 07/11/2023 99  98 - 111 mmol/L Final   CO2 07/11/2023 28  22 - 32 mmol/L Final   Glucose, Bld 07/11/2023 103 (H)  70 - 99 mg/dL Final   Glucose reference range applies only to samples taken after fasting for at least 8 hours.   BUN 07/11/2023 15  6 - 20 mg/dL Final   Creatinine, Ser  07/11/2023 0.68  0.44 - 1.00 mg/dL Final   Calcium 16/12/9602 9.2  8.9 - 10.3 mg/dL Final   GFR, Estimated 07/11/2023 >60  >60 mL/min Final   Comment: (NOTE) Calculated using the CKD-EPI Creatinine Equation (2021)    Anion gap 07/11/2023 12  5 - 15 Final   Performed at [facility], [address], [place], [place] [address]   Magnesium 07/11/2023 2.0  1.7 - 2.4 mg/dL Final   Performed at [facility], [address], [place], [place] [address]  Admission on 07/10/2023, Discharged on 07/10/2023  Component Date Value Ref Range Status   Sodium 07/10/2023 138  135 - 145 mmol/L Final   Potassium 07/10/2023 2.9 (L)  3.5 - 5.1 mmol/L Final   Chloride 07/10/2023 100  98 - 111 mmol/L Final   CO2 07/10/2023 28  22 - 32 mmol/L Final   Glucose, Bld 07/10/2023 131 (H)  70 - 99 mg/dL Final   Glucose reference range applies only to samples taken after fasting for at least 8 hours.   BUN 07/10/2023 14  6 - 20 mg/dL Final   Creatinine, Ser 07/10/2023 0.81  0.44 - 1.00 mg/dL Final   Calcium 78/29/5621 8.7 (L)  8.9 - 10.3 mg/dL Final   Total Protein 30/86/5784 6.9  6.5 - 8.1 g/dL Final   Albumin 69/62/9528 3.5  3.5 - 5.0 g/dL Final   AST 41/32/4401 39  15 - 41 U/L Final   ALT 07/10/2023 31  0 - 44 U/L Final   Alkaline Phosphatase 07/10/2023 86  38 - 126 U/L Final   Total Bilirubin 07/10/2023 0.5  0.0 - 1.2 mg/dL Final   GFR, Estimated 07/10/2023 >60  >60 mL/min Final   Comment: (NOTE) Calculated using the CKD-EPI Creatinine Equation (2021)    Anion gap 07/10/2023 10  5 - 15 Final   Performed at [facility], [address] [place], [place] [address]   Alcohol, Ethyl (B) 07/10/2023 <10  <10 mg/dL Final   Comment: (NOTE) Lowest detectable limit for serum alcohol is 10 mg/dL.  For medical purposes only. Performed at [facility], [address], [place], [place] [address]    Opiates 07/10/2023 NONE DETECTED  NONE DETECTED Final   Cocaine 07/10/2023 POSITIVE (A)  NONE  DETECTED Final   Benzodiazepines 07/10/2023 NONE DETECTED  NONE DETECTED Final   Amphetamines 07/10/2023 POSITIVE (A)  NONE DETECTED Final   Comment: (NOTE) Trazodone is metabolized in vivo to several metabolites, including pharmacologically active m-CPP, which is excreted in the urine. Immunoassay screens for amphetamines and MDMA have potential cross-reactivity with these compounds and may provide false positive  results.     Tetrahydrocannabinol 07/10/2023 NONE DETECTED  NONE DETECTED Final   Barbiturates 07/10/2023 NONE DETECTED  NONE DETECTED Final   Comment: (NOTE) DRUG SCREEN FOR MEDICAL PURPOSES ONLY.  IF CONFIRMATION IS NEEDED FOR ANY PURPOSE, NOTIFY LAB WITHIN 5 DAYS.  LOWEST DETECTABLE LIMITS FOR URINE DRUG SCREEN Drug Class                     Cutoff (ng/mL) Amphetamine and metabolites    1000 Barbiturate and metabolites    200 Benzodiazepine                 200 Opiates and metabolites        300 Cocaine and metabolites        300 THC                            50 Performed at [facility], [address] [place], [place] [address]    WBC 07/10/2023 6.9  4.0 - 10.5 K/uL Final   RBC 07/10/2023 4.50  3.87 - 5.11 MIL/uL Final   Hemoglobin 07/10/2023 13.2  12.0 - 15.0 g/dL Final   HCT 91/47/8295 39.6  36.0 - 46.0 % Final   MCV 07/10/2023 88.0  80.0 - 100.0 fL Final   MCH 07/10/2023 29.3  26.0 - 34.0 pg Final   MCHC 07/10/2023 33.3  30.0 - 36.0 g/dL Final   RDW 62/13/0865 12.9  11.5 - 15.5 % Final   Platelets 07/10/2023 323  150 - 400 K/uL Final   nRBC 07/10/2023 0.0  0.0 - 0.2 % Final   Neutrophils Relative % 07/10/2023 71  % Final   Neutro Abs 07/10/2023 4.9  1.7 - 7.7 K/uL Final   Lymphocytes Relative 07/10/2023 22  % Final   Lymphs Abs 07/10/2023 1.5  0.7 - 4.0 K/uL Final   Monocytes Relative 07/10/2023 4  % Final   Monocytes Absolute 07/10/2023 0.3  0.1 - 1.0 K/uL Final   Eosinophils Relative 07/10/2023 2  % Final   Eosinophils Absolute 07/10/2023  0.1  0.0 - 0.5 K/uL Final   Basophils Relative 07/10/2023 1  % Final   Basophils Absolute 07/10/2023 0.1  0.0 - 0.1 K/uL Final   Immature Granulocytes 07/10/2023 0  % Final   Abs Immature Granulocytes 07/10/2023 0.03  0.00 - 0.07 K/uL Final   Performed at [facility], [address], [place], [place] [address]  Admission on 04/05/2023, Discharged on 04/05/2023  Component Date Value Ref Range Status   Sodium 04/05/2023 137  135 - 145 mmol/L Final   Potassium 04/05/2023 3.7  3.5 - 5.1 mmol/L Final  Chloride 04/05/2023 104  98 - 111 mmol/L Final   CO2 04/05/2023 22  22 - 32 mmol/L Final   Glucose, Bld 04/05/2023 110 (H)  70 - 99 mg/dL Final   Glucose reference range applies only to samples taken after fasting for at least 8 hours.   BUN 04/05/2023 25 (H)  6 - 20 mg/dL Final   Creatinine, Ser 04/05/2023 0.85  0.44 - 1.00 mg/dL Final   Calcium 81/19/1478 9.0  8.9 - 10.3 mg/dL Final   GFR, Estimated 04/05/2023 >60  >60 mL/min Final   Comment: (NOTE) Calculated using the CKD-EPI Creatinine Equation (2021)    Anion gap 04/05/2023 11  5 - 15 Final   Performed at [facility], [address], [place], [place] [address]   Troponin I (High Sensitivity) 04/05/2023 5  <18 ng/L Final   Comment: (NOTE) Elevated high sensitivity troponin I (hsTnI) values and significant  changes across serial measurements may suggest ACS but many other  chronic and acute conditions are known to elevate hsTnI results.  Refer to the "Links" section for chest pain algorithms and additional  guidance. Performed at [facility], [address], [place], [place] [address]    WBC 04/05/2023 9.3  4.0 - 10.5 K/uL Final   RBC 04/05/2023 4.40  3.87 - 5.11 MIL/uL Final   Hemoglobin 04/05/2023 13.4  12.0 - 15.0 g/dL Final   HCT 57/84/6962 39.2  36.0 - 46.0 % Final   MCV 04/05/2023 89.1  80.0 - 100.0 fL Final   MCH 04/05/2023 30.5  26.0 - 34.0 pg Final   MCHC 04/05/2023 34.2   30.0 - 36.0 g/dL Final   RDW 95/28/4132 12.3  11.5 - 15.5 % Final   Platelets 04/05/2023 378  150 - 400 K/uL Final   nRBC 04/05/2023 0.0  0.0 - 0.2 % Final   Performed at [facility], [address], [place], [place] [address]   Magnesium 04/05/2023 1.8  1.7 - 2.4 mg/dL Final   Performed at [facility], [address] [place], [place] [address]   B Natriuretic Peptide 04/05/2023 68.1  0.0 - 100.0 pg/mL Final   Performed at [facility], [address], [place], [place] [address]   Troponin I (High Sensitivity) 04/05/2023 5  <18 ng/L Final   Comment: (NOTE) Elevated high sensitivity troponin I (hsTnI) values and significant  changes across serial measurements may suggest ACS but many other  chronic and acute conditions are known to elevate hsTnI results.  Refer to the "Links" section for chest pain algorithms and additional  guidance. Performed at [facility], [address], [place], [place] [address]     Blood Alcohol level:  Lab Results  Component Value Date   ETH <10 07/10/2023   ETH <10 12/12/2022    Metabolic Disorder Labs: No results found for: "HGBA1C", "MPG" No results found for: "PROLACTIN" No results found for: "CHOL", "TRIG", "HDL", "CHOLHDL", "VLDL", "LDLCALC"  Therapeutic Lab Levels: No results found for: "LITHIUM" No results found for: "VALPROATE" No results found for: "CBMZ"  Physical Findings   AUDIT    Flowsheet Row [facility]  Alcohol Use Disorder Identification Test Final Score (AUDIT) 0      PHQ2-9    Flowsheet Row ED from 07/10/2023 in [facility]  PHQ-2 Total Score 6      Flowsheet Row ED from 07/10/2023 in  [facility] Most recent reading at 07/10/2023  7:11 PM ED from 07/10/2023 in [facility] Emergency Department at [facility] Most recent reading at 07/10/2023 11:38 AM ED from 04/05/2023 in [facility] Emergency Department at [facility] Most recent reading at 04/05/2023  3:35 AM  C-SSRS RISK CATEGORY High Risk High Risk No Risk        Musculoskeletal  Strength & Muscle Tone: within normal limits Gait & Station: normal Patient leans: N/A  Psychiatric Specialty Exam  Presentation  General Appearance:  Disheveled; Appropriate for Environment  Eye Contact: Good  Speech: Clear and Coherent; Normal Rate  Speech Volume: Normal  Handedness: Right   Mood and Affect  Mood: -- ("Been sleeping")  Affect: Flat; Congruent   Thought Process  Thought Processes: Coherent; Linear  Descriptions of Associations:Intact  Orientation:Full (Time, Place and Person)  Thought Content:WDL     Hallucinations:Hallucinations: None  Ideas of Reference:None  Suicidal Thoughts:Suicidal Thoughts: Yes, Active SI Active Intent and/or Plan: With Plan  Homicidal Thoughts:Homicidal Thoughts: No   Sensorium  Memory: Immediate Good  Judgment: Poor  Insight: Poor   Executive Functions  Concentration: Poor  Attention Span: Poor  Recall: Poor  Fund of Knowledge: Fair  Language: Fair   Psychomotor Activity  Psychomotor Activity: Psychomotor Activity: Normal   Assets  Assets: Desire for Improvement   Sleep  Sleep: Sleep: Good   Physical Exam  Physical Exam Eyes:     Conjunctiva/sclera: Conjunctivae normal.  Pulmonary:     Effort: Pulmonary effort is normal.  Musculoskeletal:        General: Normal range of motion.     Cervical back: Normal range of motion.  Neurological:     Mental Status: She is alert and oriented to person, place, and time.    Review of Systems  Constitutional:  Positive for diaphoresis. Negative for chills, fever and weight loss.  HENT:  Negative for hearing loss and sore throat.   Eyes:  Negative for blurred vision.  Respiratory:   Negative for cough, shortness of breath and wheezing.   Cardiovascular:  Negative for chest pain and palpitations.  Gastrointestinal:  Negative for constipation, diarrhea, nausea and vomiting.  Genitourinary:  Negative for dysuria and frequency.  Musculoskeletal:  Positive for myalgias.  Neurological:  Positive for dizziness and headaches.  Psychiatric/Behavioral:  Positive for depression, substance abuse and suicidal ideas. Negative for hallucinations. The patient is nervous/anxious.    Blood pressure 139/73, pulse 63, temperature 98.2 F (36.8 C), temperature source Temporal, resp. rate 17, SpO2 97%. There is no height or weight on file to calculate BMI.  Assessment: Alisha Webster is a 53 y.o. female with a past psychiatric history of MDD, GAD, and methamphetamine use disorder and past medical history of Sjogren's syndrome, hypothyroidism, and hypertension who presented to [facility] from voluntary walk-in at [facility] for suicidal ideation with plan to overdose.   On assessment, patient continues to require inpatient hospitalization for management of her condition. She presents as fatigued and hypersomnolent since admission. She exhibits signs of stimulant withdrawal and has not eaten or hydrated well since admission. She endorsed active SI with plan to overdose but has had no access to means or attempts. Her thoughts of self-harm continue to be related to her housing situation. She denied any HI or AVH. Her anxiety and depression is largely unchanged, but patient has been sleeping for most of her hospital course. We will continue to monitor the patient's condition, medication efficacy  and side effects, as well as talk to social work to determine patient's disposition once stabilized.  Treatment Plan Summary:  Stimulant Use Disorder, methamphetamine Cocaine abuse MDD GAD Substance induced mood Disorder Housing Instability Concern for drug diversion Possible Opioid Use (rx oxycodone but UDS  negative) - Continue COWS-most recent score is 5              - PRNs added to address symptomatic opiate withdrawal symptoms - Continue buspirone 10 mg twice daily for anxiety - Continue Effexor 150 mg daily - Continue Abilify 5 mg daily   Hypothyroidism - Continue home Synthroid 100 mcg  Chronic pain syndrome - Continue Ibuprofen prn  GERD - Continue home Protonix 40 mg daily  Hypertension - Continue HCTZ 25 mg daily  Park Bolk, Medical Student 07/12/2023 11:34 AM  I personally was present and performed or re-performed the history and medical decision-making activities of this service and have verified that the service and findings are accurately documented in the student's note.  Saratha Cunas, MD, PGY-2

## 2023-07-12 NOTE — ED Notes (Signed)
 Patient has remained asleep in bed all day.  She does not make any initiation to engage with staff or peers.  Patient is without withdrawal or somatic distress.  Patient has poor eye contact.  Denies avh shi or plan.  Will monitor.

## 2023-07-12 NOTE — Group Note (Signed)
 Group Topic: Social Support  Group Date: 07/12/2023 Start Time: 2015 End Time: 2030 Facilitators: Soila Dunnings  Department: [facility]  Number of Participants: 4  Group Focus: acceptance and coping skills Treatment Modality:  Leisure Development Interventions utilized were leisure development Purpose: enhance coping skills and relapse prevention strategies  Name: Alisha Webster Date of Birth: February 02, 1971  MR: [id number]    Level of Participation: pt did not attend  group  Quality of Participation: pt did not attend group  Interactions with others: no interaction with others, pt has remained in her room since admission Mood/Affect: flat Triggers (if applicable): n/a Cognition: uta Progress: Minimal Response: pt remains in her room, verbalizes not feeling well.  Plan: patient will be encouraged to attend group   Patients Problems:  Patient Active Problem List   Diagnosis Date Noted   Methamphetamine use disorder, severe (HCC) 07/11/2023   Chronic pain syndrome 07/11/2023   Homelessness 07/11/2023   Suicidal ideation 07/10/2023   Substance induced mood disorder (HCC) 07/10/2023   GAD (generalized anxiety disorder) 11/24/2022   MDD (major depressive disorder) 11/23/2022   MDD (major depressive disorder), recurrent severe, without psychosis (HCC) 11/23/2022   AKI (acute kidney injury) (HCC) 11/19/2022   Anxiety and depression 11/19/2022   Hypothyroidism 11/19/2022   Sjogren's disease (HCC) 11/19/2022   Syncope 01/25/2013   Hypertension 09/02/2011   Depression 09/02/2011   Hyperlipidemia 09/02/2011   Abdominal pain 09/02/2011

## 2023-07-13 NOTE — ED Notes (Signed)
 Patient is sleeping. Respirations equal and unlabored, skin warm and dry. No change in assessment or acuity. Routine safety checks conducted according to facility protocol. Will continue to monitor for safety.

## 2023-07-13 NOTE — ED Notes (Signed)
 Patient is malodorous and writer attempted to get her out of bed and into the shower with assistance.  Shower chair was brought, shower turned on and towels and shower supplies gotten for her.  She refused to get up out of bed to shower.  She was irritable and hostile to Clinical research associate.  Patient stated she would shower later however she is a fall risk and will require assistance.    Patient also has elevated pulse of 108.  MD made aware.

## 2023-07-13 NOTE — ED Notes (Addendum)
 Patient got out of bed with strong prompts and ate a small amount of food.  She drank some juice and then returned to her room.  Patient was instructed to clean up after herself as she left her food on the table for staff to clean up several times.  She was also asked to clean up after herself in the bathroom.  Patient also very malodorous and encouraged to shower.  Patient states she is afraid of falling.  She was informed that staff will help her and we will give her a chair.  Patient not very motivated to attend to her ADL's with or without assistance.  Will monitor and continue to provide support and encouragement.

## 2023-07-13 NOTE — ED Provider Notes (Signed)
 Behavioral Health Medical Student Progress Note  Date and Time: 07/13/2023 10:27 AM Name: Alisha Webster MRN:  [id number]  Alisha Webster is a 53 y.o. female with a past psychiatric history of MDD, GAD, and methamphetamine use disorder and past medical history of Sjogren's syndrome, hypothyroidism, and hypertension who presented to [facility] from voluntary walk-in at [facility] for suicidal ideation with plan to overdose.  Subjective:   The patient was seen and assessed in her room today. On interview, the patient reported feeling okay. She described her mood as the "same as yesterday." She reported that over the last 24 hours she has spent all of her time sleeping. She stated that she has not been able to do much since admission besides sleep and go to the bathroom because of how tire she has felt. She denied attending any group sessions or participating in any activities. She endorsed sleeping on and off last night, waking up several times because she felt hot and sweaty. She continued to report not having much of an appetite or feeling thirsty, but stated that she was going to attempt to eat and hydrate more today. She denied having a recent bowel movement, stating her last bowel movement was on Saturday. She reported her anxiety and depression levels as both 9/10 (with 10 being the most severe). She reported her energy levels as 0/10 and pain as 9/10 (with 10 being the most in both cases) in her back and both knees. She endorsed thoughts of active SI with a plan to overdose. She denied having any current means, attempts, or alternative methods of self-harm since being hospitalized. She denied any preparatory behavior including notes or giving away possessions. She continued to endorse her homelessness as a stressor for her current state. She denied any HI or AVH and was not responding to internal stimuli during our discussion. She endorsed cravings for methamphetamines, but denied any cravings for other substances.  She reported symptoms consistent with stimulant withdrawal including headache, anxiety, depression, sweating, fatigue, and hypersomnia. She denied any other symptoms. She stated that she has not felt any beneficial effects from her medication regimen or felt any side effects. She reported not having thought any further about her plans upon discharge, stating that she was waiting for her disability benefits and was homeless. When asked about residential treatment after detox, she stated "I don't know."  Diagnosis:  Final diagnoses:  MDD (major depressive disorder), recurrent severe, without psychosis (HCC)  Substance induced mood disorder (HCC)  Acquired hypothyroidism  Methamphetamine use disorder, severe (HCC)  Homelessness  Chronic pain syndrome    Total Time spent with patient: 20 minutes  (Pertinent past psychiatric, medical, family, and social history obtained from patient's H&P.)   Past Psychiatric History:  Diagnoses: MDD, GAD Current psychotropic meds: effexor, abilify, buspar Previous psychotropic meds: gabapentin Psychiatrist/therapist: Barbette Merino, NP Hospitalizations: multiple for SI Suicide attempts: multiple SIB: endorses   Past Medical History:  Dx:  has a past medical history of Anxiety, Back pain, Depression, Hyperlipidemia, and Hypertension.  Allergies: Deltasone [prednisone], Lopid [gemfibrozil], Norvasc [amlodipine], Reglan [metoclopramide], Statins, Tylenol [acetaminophen], and Zestril [lisinopril]   Family History:  No relevant family history was noted in patient's H&P.   Family Psychiatric  History:  No relevant family history was noted in patient's H&P.   Social History:  Substance Use History: Tobacco: denies Alcohol: denies Marijuana: denies Stimulant: endorses methamphetamine use for past 6-7 years and crack cocaine abuse Opioid: prescribed but history of fentanyl abuse Benzo: denies Rehab history:  denies  Additional Social History:    Sleep: Fair  Appetite:  Poor  Current Medications:  Current Facility-Administered Medications  Medication Dose Route Frequency Provider Last Rate Last Admin   ARIPiprazole (ABILIFY) tablet 5 mg  5 mg Oral Daily Lewis, Tanika N, NP   5 mg at 07/13/23 0943   busPIRone (BUSPAR) tablet 10 mg  10 mg Oral BID Ji, Andrew, MD   10 mg at 07/13/23 0943   dicyclomine (BENTYL) tablet 20 mg  20 mg Oral Q6H PRN Augusta Blizzard, MD       haloperidol (HALDOL) tablet 5 mg  5 mg Oral TID PRN Lewis, Tanika N, NP       And   diphenhydrAMINE (BENADRYL) capsule 50 mg  50 mg Oral TID PRN Levester Reagin, NP       haloperidol lactate (HALDOL) injection 5 mg  5 mg Intramuscular TID PRN Lewis, Tanika N, NP       And   diphenhydrAMINE (BENADRYL) injection 50 mg  50 mg Intramuscular TID PRN Lewis, Tanika N, NP       And   LORazepam (ATIVAN) injection 2 mg  2 mg Intramuscular TID PRN Levester Reagin, NP       haloperidol lactate (HALDOL) injection 10 mg  10 mg Intramuscular TID PRN Lewis, Tanika N, NP       And   diphenhydrAMINE (BENADRYL) injection 50 mg  50 mg Intramuscular TID PRN Lewis, Tanika N, NP       And   LORazepam (ATIVAN) injection 2 mg  2 mg Intramuscular TID PRN Lewis, Tanika N, NP       feeding supplement (ENSURE ENLIVE / ENSURE PLUS) liquid 237 mL  237 mL Oral BID BM Adilen Pavelko B, MD   237 mL at 07/13/23 0943   hydrochlorothiazide (HYDRODIURIL) tablet 25 mg  25 mg Oral Daily Ji, Andrew, MD   25 mg at 07/13/23 0943   hydrOXYzine (ATARAX) tablet 25 mg  25 mg Oral Q6H PRN Augusta Blizzard, MD       ibuprofen (ADVIL) tablet 200 mg  200 mg Oral Q6H PRN Augusta Blizzard, MD   200 mg at 07/12/23 2149   levothyroxine (SYNTHROID) tablet 100 mcg  100 mcg Oral Daily Augusta Blizzard, MD   100 mcg at 07/13/23 0524   loperamide (IMODIUM) capsule 2-4 mg  2-4 mg Oral PRN Augusta Blizzard, MD       methocarbamol (ROBAXIN) tablet 500 mg  500 mg Oral Daily PRN Augusta Blizzard, MD   500 mg at 07/12/23 2149   naproxen (NAPROSYN) tablet 500 mg   500 mg Oral BID PRN Augusta Blizzard, MD       ondansetron (ZOFRAN-ODT) disintegrating tablet 4 mg  4 mg Oral Q6H PRN Augusta Blizzard, MD   4 mg at 07/12/23 2149   pantoprazole (PROTONIX) EC tablet 40 mg  40 mg Oral Daily Ji, Andrew, MD   40 mg at 07/13/23 0943   venlafaxine XR (EFFEXOR-XR) 24 hr capsule 150 mg  150 mg Oral Daily Ji, Andrew, MD   150 mg at 07/13/23 1610   Current Outpatient Medications  Medication Sig Dispense Refill   gabapentin (NEURONTIN) 600 MG tablet Take 600 mg by mouth 3 (three) times daily.     venlafaxine XR (EFFEXOR-XR) 150 MG 24 hr capsule Take 150 mg by mouth daily.     ARIPiprazole (ABILIFY) 5 MG tablet Take 5 mg by mouth daily.     busPIRone (BUSPAR) 10 MG tablet  Take 10 mg by mouth 2 (two) times daily.     hydrochlorothiazide (HYDRODIURIL) 25 MG tablet Take 25 mg by mouth daily.     ibuprofen (ADVIL) 200 MG tablet Take 800 mg by mouth 2 (two) times daily.     levETIRAcetam (KEPPRA) 1000 MG tablet Take 1,000 mg by mouth 2 (two) times daily.     levothyroxine (SYNTHROID) 88 MCG tablet Take 88 mcg by mouth daily.     magnesium oxide (MAG-OX) 400 (240 Mg) MG tablet Take 400 mg by mouth daily.     methocarbamol (ROBAXIN) 500 MG tablet Take 1 tablet (500 mg total) by mouth 2 (two) times daily. (Patient taking differently: Take 500 mg by mouth daily as needed for muscle spasms.) 20 tablet 0   omeprazole (PRILOSEC) 20 MG capsule Take 20 mg by mouth daily.     oxyCODONE HCl 10 MG TABA Take 10 mg by mouth 3 (three) times daily.     potassium chloride SA (KLOR-CON M) 20 MEQ tablet Take 1 tablet (20 mEq total) by mouth daily. (Patient not taking: Reported on 07/10/2023) 7 tablet 0   Vitamin D, Ergocalciferol, (DRISDOL) 1.25 MG (50000 UNIT) CAPS capsule Take 50,000 Units by mouth every Monday.      Labs  Lab Results:  Admission on 07/10/2023  Component Date Value Ref Range Status   Sodium 07/11/2023 139  135 - 145 mmol/L Final   Potassium 07/11/2023 3.6  3.5 - 5.1 mmol/L Final    Chloride 07/11/2023 99  98 - 111 mmol/L Final   CO2 07/11/2023 28  22 - 32 mmol/L Final   Glucose, Bld 07/11/2023 103 (H)  70 - 99 mg/dL Final   Glucose reference range applies only to samples taken after fasting for at least 8 hours.   BUN 07/11/2023 15  6 - 20 mg/dL Final   Creatinine, Ser 07/11/2023 0.68  0.44 - 1.00 mg/dL Final   Calcium 28/41/3244 9.2  8.9 - 10.3 mg/dL Final   GFR, Estimated 07/11/2023 >60  >60 mL/min Final   Comment: (NOTE) Calculated using the CKD-EPI Creatinine Equation (2021)    Anion gap 07/11/2023 12  5 - 15 Final   Performed at [facility], [address], [place], [place] [address]   Magnesium 07/11/2023 2.0  1.7 - 2.4 mg/dL Final   Performed at [facility], [address], [place], [place] [address]  Admission on 07/10/2023, Discharged on 07/10/2023  Component Date Value Ref Range Status   Sodium 07/10/2023 138  135 - 145 mmol/L Final   Potassium 07/10/2023 2.9 (L)  3.5 - 5.1 mmol/L Final   Chloride 07/10/2023 100  98 - 111 mmol/L Final   CO2 07/10/2023 28  22 - 32 mmol/L Final   Glucose, Bld 07/10/2023 131 (H)  70 - 99 mg/dL Final   Glucose reference range applies only to samples taken after fasting for at least 8 hours.   BUN 07/10/2023 14  6 - 20 mg/dL Final   Creatinine, Ser 07/10/2023 0.81  0.44 - 1.00 mg/dL Final   Calcium 44/05/4740 8.7 (L)  8.9 - 10.3 mg/dL Final   Total Protein 59/56/3875 6.9  6.5 - 8.1 g/dL Final   Albumin 64/33/2951 3.5  3.5 - 5.0 g/dL Final   AST 88/41/6606 39  15 - 41 U/L Final   ALT 07/10/2023 31  0 - 44 U/L Final   Alkaline Phosphatase 07/10/2023 86  38 - 126 U/L Final   Total Bilirubin 07/10/2023  0.5  0.0 - 1.2 mg/dL Final   GFR, Estimated 07/10/2023 >60  >60 mL/min Final   Comment: (NOTE) Calculated using the CKD-EPI Creatinine Equation (2021)    Anion gap 07/10/2023 10  5 - 15 Final   Performed at [facility], [address], [place], [place] [address]   Alcohol, Ethyl (B) 07/10/2023 <10  <10  mg/dL Final   Comment: (NOTE) Lowest detectable limit for serum alcohol is 10 mg/dL.  For medical purposes only. Performed at [facility], [address], [place], [place] [address]    Opiates 07/10/2023 NONE DETECTED  NONE DETECTED Final   Cocaine 07/10/2023 POSITIVE (A)  NONE DETECTED Final   Benzodiazepines 07/10/2023 NONE DETECTED  NONE DETECTED Final   Amphetamines 07/10/2023 POSITIVE (A)  NONE DETECTED Final   Comment: (NOTE) Trazodone is metabolized in vivo to several metabolites, including pharmacologically active m-CPP, which is excreted in the urine. Immunoassay screens for amphetamines and MDMA have potential cross-reactivity with these compounds and may provide false positive  results.     Tetrahydrocannabinol 07/10/2023 NONE DETECTED  NONE DETECTED Final   Barbiturates 07/10/2023 NONE DETECTED  NONE DETECTED Final   Comment: (NOTE) DRUG SCREEN FOR MEDICAL PURPOSES ONLY.  IF CONFIRMATION IS NEEDED FOR ANY PURPOSE, NOTIFY LAB WITHIN 5 DAYS.  LOWEST DETECTABLE LIMITS FOR URINE DRUG SCREEN Drug Class                     Cutoff (ng/mL) Amphetamine and metabolites    1000 Barbiturate and metabolites    200 Benzodiazepine                 200 Opiates and metabolites        300 Cocaine and metabolites        300 THC                            50 Performed at [facility], [address] [place], [place] [address]    WBC 07/10/2023 6.9  4.0 - 10.5 K/uL Final   RBC 07/10/2023 4.50  3.87 - 5.11 MIL/uL Final   Hemoglobin 07/10/2023 13.2  12.0 - 15.0 g/dL Final   HCT 41/32/4401 39.6  36.0 - 46.0 % Final   MCV 07/10/2023 88.0  80.0 - 100.0 fL Final   MCH 07/10/2023 29.3  26.0 - 34.0 pg Final   MCHC 07/10/2023 33.3  30.0 - 36.0 g/dL Final   RDW 02/72/5366 12.9  11.5 - 15.5 % Final   Platelets 07/10/2023 323  150 - 400 K/uL Final   nRBC 07/10/2023 0.0  0.0 - 0.2 % Final   Neutrophils Relative % 07/10/2023 71  % Final   Neutro Abs 07/10/2023 4.9  1.7 - 7.7  K/uL Final   Lymphocytes Relative 07/10/2023 22  % Final   Lymphs Abs 07/10/2023 1.5  0.7 - 4.0 K/uL Final   Monocytes Relative 07/10/2023 4  % Final   Monocytes Absolute 07/10/2023 0.3  0.1 - 1.0 K/uL Final   Eosinophils Relative 07/10/2023 2  % Final   Eosinophils Absolute 07/10/2023 0.1  0.0 - 0.5 K/uL Final   Basophils Relative 07/10/2023 1  % Final   Basophils Absolute 07/10/2023 0.1  0.0 - 0.1 K/uL Final   Immature Granulocytes 07/10/2023 0  % Final   Abs Immature Granulocytes 07/10/2023 0.03  0.00 - 0.07 K/uL Final   Performed at [facility], [address] [place], [place] [address]  Admission on 04/05/2023, Discharged on 04/05/2023  Component Date Value Ref Range Status   Sodium 04/05/2023 137  135 - 145 mmol/L Final   Potassium 04/05/2023 3.7  3.5 - 5.1 mmol/L Final   Chloride 04/05/2023 104  98 - 111 mmol/L Final   CO2 04/05/2023 22  22 - 32 mmol/L Final   Glucose, Bld 04/05/2023 110 (H)  70 - 99 mg/dL Final   Glucose reference range applies only to samples taken after fasting for at least 8 hours.   BUN 04/05/2023 25 (H)  6 - 20 mg/dL Final   Creatinine, Ser 04/05/2023 0.85  0.44 - 1.00 mg/dL Final   Calcium 60/45/4098 9.0  8.9 - 10.3 mg/dL Final   GFR, Estimated 04/05/2023 >60  >60 mL/min Final   Comment: (NOTE) Calculated using the CKD-EPI Creatinine Equation (2021)    Anion gap 04/05/2023 11  5 - 15 Final   Performed at [facility], [address], [place], [place] [address]   Troponin I (High Sensitivity) 04/05/2023 5  <18 ng/L Final   Comment: (NOTE) Elevated high sensitivity troponin I (hsTnI) values and significant  changes across serial measurements may suggest ACS but many other  chronic and acute conditions are known to elevate hsTnI results.  Refer to the "Links" section for chest pain algorithms and additional  guidance. Performed at [facility], [address] [place], [place] [address]    WBC 04/05/2023  9.3  4.0 - 10.5 K/uL Final   RBC 04/05/2023 4.40  3.87 - 5.11 MIL/uL Final   Hemoglobin 04/05/2023 13.4  12.0 - 15.0 g/dL Final   HCT 62/13/0865 39.2  36.0 - 46.0 % Final   MCV 04/05/2023 89.1  80.0 - 100.0 fL Final   MCH 04/05/2023 30.5  26.0 - 34.0 pg Final   MCHC 04/05/2023 34.2  30.0 - 36.0 g/dL Final   RDW 78/46/9629 12.3  11.5 - 15.5 % Final   Platelets 04/05/2023 378  150 - 400 K/uL Final   nRBC 04/05/2023 0.0  0.0 - 0.2 % Final   Performed at [facility], [address], [place], [place] [address]   Magnesium 04/05/2023 1.8  1.7 - 2.4 mg/dL Final   Performed at [facility], [address] [place], [place] [address]   B Natriuretic Peptide 04/05/2023 68.1  0.0 - 100.0 pg/mL Final   Performed at [facility], [address] [place], [place] [address]   Troponin I (High Sensitivity) 04/05/2023 5  <18 ng/L Final   Comment: (NOTE) Elevated high sensitivity troponin I (hsTnI) values and significant  changes across serial measurements may suggest ACS but many other  chronic and acute conditions are known to elevate hsTnI results.  Refer to the "Links" section for chest pain algorithms and additional  guidance. Performed at [facility], [address], [place], [place] [address]     Blood Alcohol level:  Lab Results  Component Value Date   ETH <10 07/10/2023   ETH <10 12/12/2022    Metabolic Disorder Labs: No results found for: "HGBA1C", "MPG" No results found for: "PROLACTIN" No results found for: "CHOL", "TRIG", "HDL", "CHOLHDL", "VLDL", "LDLCALC"  Therapeutic Lab Levels: No results found for: "LITHIUM" No results found for: "VALPROATE" No results found for: "CBMZ"  Physical Findings   AUDIT    Flowsheet Row [facility]  Alcohol Use Disorder Identification Test Final Score (AUDIT) 0      PHQ2-9    Flowsheet  Row ED from 07/10/2023 in [facility]  PHQ-2 Total Score 6      Flowsheet Row ED from 07/10/2023 in [facility] Most recent reading at 07/10/2023  7:11 PM ED from 07/10/2023 in [facility] at [facility] Most recent reading at 07/10/2023 11:38 AM ED from 04/05/2023 in [facility] Emergency Department at [facility] Most recent reading at 04/05/2023  3:35 AM  C-SSRS RISK CATEGORY High Risk High Risk No Risk        Musculoskeletal  Strength & Muscle Tone: within normal limits Gait & Station: normal Patient leans: N/A  Psychiatric Specialty Exam  Presentation  General Appearance:  Appropriate for Environment; Disheveled  Eye Contact: Good  Speech: Clear and Coherent; Normal Rate  Speech Volume: Normal  Handedness: Right   Mood and Affect  Mood: -- ("Same as yesterday")  Affect: Congruent; Flat   Thought Process  Thought Processes: Coherent; Linear  Descriptions of Associations:Intact  Orientation:Full (Time, Place and Person)  Thought Content:WDL     Hallucinations:Hallucinations: None  Ideas of Reference:None  Suicidal Thoughts:Suicidal Thoughts: Yes, Active SI Active Intent and/or Plan: With Plan  Homicidal Thoughts:Homicidal Thoughts: No   Sensorium  Memory: Immediate Good; Recent Poor; Remote Poor  Judgment: Poor  Insight: Poor   Executive Functions  Concentration: Poor  Attention Span: Poor  Recall: Poor  Fund of Knowledge: Fair  Language: Fair   Psychomotor Activity  Psychomotor Activity: Psychomotor Activity: Normal   Assets  Assets: Desire for Improvement   Sleep  Sleep: Sleep: Fair   Physical Exam  Physical Exam Eyes:     Conjunctiva/sclera: Conjunctivae normal.  Pulmonary:     Effort: Pulmonary effort is normal.  Musculoskeletal:        General: Normal range of motion.     Cervical back: Normal range of  motion.  Neurological:     Mental Status: She is alert and oriented to person, place, and time.    Review of Systems  Constitutional:  Positive for diaphoresis and malaise/fatigue. Negative for chills and fever.  HENT:  Negative for hearing loss and sore throat.   Eyes:  Negative for blurred vision and double vision.  Respiratory:  Negative for cough, shortness of breath and wheezing.   Cardiovascular:  Negative for chest pain and palpitations.  Gastrointestinal:  Positive for nausea. Negative for constipation, diarrhea and vomiting.  Genitourinary:  Negative for dysuria, frequency and urgency.  Musculoskeletal:  Negative for myalgias.  Neurological:  Positive for dizziness and headaches.  Psychiatric/Behavioral:  Positive for depression, substance abuse and suicidal ideas. Negative for hallucinations. The patient is nervous/anxious.    Blood pressure 97/66, pulse 82, temperature 97.9 F (36.6 C), temperature source Oral, resp. rate 18, SpO2 97%. There is no height or weight on file to calculate BMI.  Assessment: Alisha Webster is a 53 y.o. female with a past psychiatric history of MDD, GAD, and methamphetamine use disorder and past medical history of Sjogren's syndrome, hypothyroidism, and hypertension who presented to [facility] from voluntary walk-in at [facility] for suicidal ideation with plan to overdose.  On assessment, the patient continues to require inpatient hospitalization for management of detox and suicidal ideation. She continues to present as fatigued and hypersomnolent, consistent with stimulant withdrawal. She still has not eaten or hydrated, but was more motivated to eat and drink today.  She continues to endorse active SI with plan to overdose, but denies any means or attempts. Her thoughts and relationship with self-harm continue to be related to her housing situation. Her anxiety and depression is largely unchanged since hospitalization, but we discussed continuously evaluating her  symptoms during and after her detox to determine the next steps. She was amenable to this plan. We will continue to monitor the patient's activity levels, detox, medication efficacy, and side effects while consulting social work for appropriate dispo planning.  Treatment Plan Summary:  Stimulant Use Disorder, methamphetamine Cocaine abuse MDD GAD Substance induced mood Disorder Housing Instability Concern for drug diversion Possible Opioid Use (rx oxycodone but UDS negative) - Continue COWS-most recent score is 6              - PRNs added to address symptomatic opiate withdrawal symptoms - Continue buspirone 10 mg twice daily for anxiety - Continue Effexor 150 mg daily - Continue Abilify 5 mg daily, consider increasing to 10 mg   TSH WNL, A1c 6.0, lipid panel in 10/2022: total chol 248, triglycerides 227, LDL 248   Hypothyroidism - Continue home Synthroid 100 mcg   Chronic pain syndrome - Continue Ibuprofen prn   GERD - Continue home Protonix 40 mg daily   Hypertension - Continue HCTZ 25 mg daily  Dispo: residential pending  Joice Nares, MD 07/13/2023 10:27 AM

## 2023-07-13 NOTE — Group Note (Signed)
 Group Topic: Social Support  Group Date: 07/13/2023 Start Time: 2000 End Time: 2030 Facilitators: Wendall Halls B  Department: [facility]  Number of Participants: 1  Group Focus: check in Treatment Modality:  Leisure Development Interventions utilized were leisure development Purpose: express feelings, improve communication skills, increase insight, and regain self-worth  Name: Alisha Webster Date of Birth: July 23, 1970  MR: [id number]    Level of Participation: PT DID NOT ATTEND GROUPS.  Quality of Participation: cooperative Interactions with others: gave feedback Mood/Affect: appropriate Triggers (if applicable): NA Cognition: coherent/clear Progress: none Response: NA Plan: patient will be encouraged to go to groups.   Patients Problems:  Patient Active Problem List   Diagnosis Date Noted   Methamphetamine use disorder, severe (HCC) 07/11/2023   Chronic pain syndrome 07/11/2023   Homelessness 07/11/2023   Suicidal ideation 07/10/2023   Substance induced mood disorder (HCC) 07/10/2023   GAD (generalized anxiety disorder) 11/24/2022   MDD (major depressive disorder) 11/23/2022   MDD (major depressive disorder), recurrent severe, without psychosis (HCC) 11/23/2022   AKI (acute kidney injury) (HCC) 11/19/2022   Anxiety and depression 11/19/2022   Hypothyroidism 11/19/2022   Sjogren's disease (HCC) 11/19/2022   Syncope 01/25/2013   Hypertension 09/02/2011   Depression 09/02/2011   Hyperlipidemia 09/02/2011   Abdominal pain 09/02/2011

## 2023-07-13 NOTE — Care Management (Signed)
[facility] Care Management   Writer faxed referral to [facility].

## 2023-07-13 NOTE — ED Notes (Signed)
 Patient remains isolated to room with minimal verbal interaction.  Patient has not eaten or showered.  Patient is guarded and poorly related.  Patient has been encouraged to get out of bed to no avail.  Will monitor and continue to attempt to have patient get out of bed, eat and attend to adl"s

## 2023-07-13 NOTE — Group Note (Signed)
 Group Topic: Fears and Unhealthy Coping Skills  Group Date: 07/13/2023 Start Time: 1200 End Time: 1215 Facilitators: Arlan Belling, RN  Department: [facility]  Number of Participants: 8  Group Focus: check in Treatment Modality:  Behavior Modification Therapy Interventions utilized were exploration, group exercise, problem solving, and support Purpose: enhance coping skills, explore maladaptive thinking, express feelings, express irrational fears, improve communication skills, increase insight, regain self-worth, and reinforce self-care  Name: Alisha Webster Date of Birth: May 15, 1970  MR: [id number]    Level of Participation: withdrawn Quality of Participation: quiet and withdrawn Interactions with others: gave feedback Mood/Affect: depressed Triggers (if applicable):   Cognition: no insight Progress: Minimal Response:   Plan: follow-up needed  Patients Problems:  Patient Active Problem List   Diagnosis Date Noted   Methamphetamine use disorder, severe (HCC) 07/11/2023   Chronic pain syndrome 07/11/2023   Homelessness 07/11/2023   Suicidal ideation 07/10/2023   Substance induced mood disorder (HCC) 07/10/2023   GAD (generalized anxiety disorder) 11/24/2022   MDD (major depressive disorder) 11/23/2022   MDD (major depressive disorder), recurrent severe, without psychosis (HCC) 11/23/2022   AKI (acute kidney injury) (HCC) 11/19/2022   Anxiety and depression 11/19/2022   Hypothyroidism 11/19/2022   Sjogren's disease (HCC) 11/19/2022   Syncope 01/25/2013   Hypertension 09/02/2011   Depression 09/02/2011   Hyperlipidemia 09/02/2011   Abdominal pain 09/02/2011

## 2023-07-13 NOTE — ED Notes (Signed)
 Patient observed/assessed in patient room lying down asleep in bed. Patient aroused with verbal stimulation after several attempts to which patient responded with a low tone. Patient oriented x 3. Affect is flat/agitated and eye contact is minimal. Patient evasive to preliminary assessment questions. Endorses S/I  with plan to overdose and  denies H/I. Denies A/V/H. Pt refused  shower again even when she promised writer yesterday of showering and cleaning up today. Report feeling dizzy, nauseous and body aches. Medication will be administered to that effect. Will continue to attempt to encourage patient to participate within the community and not solely isolate in room. Will continue to monitor for safety. No acute distress noted. Will continue to monitor for safety.

## 2023-07-13 NOTE — ED Notes (Signed)
 Patient got out of bed and ate lunch today.  She remains dysphoric and negativistic.  Patient keeps repeating that she "is hot." Will monitor.

## 2023-07-14 LAB — LIPID PANEL
Cholesterol: 226 mg/dL — ABNORMAL HIGH (ref 0–200)
HDL: 49 mg/dL (ref >40)
LDL Cholesterol: 149 mg/dL — ABNORMAL HIGH (ref 0–99)
Total CHOL/HDL Ratio: 4.6 ratio
Triglycerides: 141 mg/dL (ref <150)
VLDL: 28 mg/dL (ref 0–40)

## 2023-07-14 MED ORDER — ARIPIPRAZOLE 10 MG PO TABS
10.0000 mg | ORAL_TABLET | Freq: Every day | ORAL | Status: DC
  Administered 2023-07-15 – 2023-07-16 (×2): 10 mg via ORAL
  Filled 2023-07-14: qty 7
  Filled 2023-07-14 (×2): qty 1

## 2023-07-14 NOTE — ED Notes (Signed)
 Patient resting with eyes closed in no apparent acute distress. Respirations even and unlabored. Environment secured. Safety checks in place according to facility policy.

## 2023-07-14 NOTE — Care Management (Signed)
 [facility] Care Management   11:40am  Per Moira Andrews at [facility] the patient referral is being reviewed and she will call back to let me know the status of his admission.   Writer referred patient to [facility], [facility] and [employer]

## 2023-07-14 NOTE — Group Note (Signed)
 Group Topic: Social Support  Group Date: 07/14/2023 Start Time: 1630 End Time: 1705 Facilitators: Truett Gab, Milissa Alley  Department: [facility]  Number of Participants: 5  Group Focus: check in Treatment Modality:  Psychoeducation Interventions utilized were patient education Purpose: increase insight  Name: Alisha Webster Date of Birth: 01/28/1971  MR: [id number]    Level of Participation: Patient did not attend group Quality of Participation: Patient did not attend group Interactions with others: N/A Mood/Affect: N/A Triggers (if applicable): N/A Cognition: N/A Progress: None Response: N/A Plan: follow-up needed  Patients Problems:  Patient Active Problem List   Diagnosis Date Noted   Methamphetamine use disorder, severe (HCC) 07/11/2023   Chronic pain syndrome 07/11/2023   Homelessness 07/11/2023   Suicidal ideation 07/10/2023   Substance induced mood disorder (HCC) 07/10/2023   GAD (generalized anxiety disorder) 11/24/2022   MDD (major depressive disorder) 11/23/2022   MDD (major depressive disorder), recurrent severe, without psychosis (HCC) 11/23/2022   AKI (acute kidney injury) (HCC) 11/19/2022   Anxiety and depression 11/19/2022   Hypothyroidism 11/19/2022   Sjogren's disease (HCC) 11/19/2022   Syncope 01/25/2013   Hypertension 09/02/2011   Depression 09/02/2011   Hyperlipidemia 09/02/2011   Abdominal pain 09/02/2011

## 2023-07-14 NOTE — ED Notes (Signed)
 Patient alert & oriented x4. Denies any thoughts of harming others, endorses passive SI, denies plan or intent at this time. Denies A/VH. Patient reports pain rating 10/10 in back and knees, PRN Ibuprofen given, patient has been asleep with no complaints since administration. No acute distress noted. Scheduled and PRN medications administered with no complications. Support and encouragement provided. Routine safety checks conducted per facility protocol. Encouraged patient to notify staff if any thoughts of harm towards self or others arise. Patient verbalizes understanding and agreement.

## 2023-07-14 NOTE — ED Notes (Signed)
 Patient is sleeping. Respirations equal and unlabored, skin warm and dry. No change in assessment or acuity. Routine safety checks conducted according to facility protocol. Will continue to monitor for safety.

## 2023-07-14 NOTE — ED Notes (Signed)
 Patient in the bedroom composed and sleeping with eyes closed. Denies SI, HI/AVH. respirations even and unlabored. Environment secured per policy. Will monitor for safety.

## 2023-07-14 NOTE — ED Provider Notes (Signed)
 Behavioral Health Progress Note  Date and Time: 07/14/2023 10:01 AM Name: Alisha Webster MRN:  [id number]  Alisha Webster is a 53 y.o. female with a past psychiatric history of MDD, GAD, and methamphetamine use disorder and past medical history of Sjogren's syndrome, hypothyroidism, and hypertension who presented to [facility] from voluntary walk-in at [facility] for suicidal ideation with plan to overdose.   Subjective:  The patient was seen and assessed in her room today. On interview, the patient reported feeling and described her mood as "about the same as yesterday." She stated that over the last 24 hours she did not sleep as much as previous days and was able to get out of bed to eat and drink. She reported attending one group session during the day and spending the rest of her time in her room. She reported sleeping on and off throughout the night, waking up multiple times without any clear reason or explanation. She reported an improved appetite compared to days prior, stating she had a hamburger for lunch yesterday and chicken tenders and macaroni for dinner. She also endorsed hydrating better. She reported having diarrhea yesterday which was unusual for her. She reported her anxiety as 8/10 and depression as 9/10 (with 10 being the most severe). She stated her energy levels continued to be low at 0/10 and endorsed no new pains. She endorsed active SI with a plan to overdose, but denied any means or attempts since being hospitalized. She contracted for safety and stated that she would try to hurt herself if she was not hospitalized. She continued to endorse a link between her homelessness and suicidal ideation. She denied any HI and AVH and was not responding to internal stimuli during the interview. She reported craving methamphetamines, saying she would likely continue using them if discharged soon. She endorsed a constellation of symptoms consistent with stimulant withdrawal including anxiety, depression,  fatigue, headaches, and hypersomnia. She denied any other symptoms. She stated that she has not felt any beneficial effect from her medications and has also not experienced any side effects. She reported wanting to be discharged to an inpatient residential treatment facility given her recent homelessness and was amenable to the idea of reaching out to [facility].  Diagnosis:  Final diagnoses:  MDD (major depressive disorder), recurrent severe, without psychosis (HCC)  Substance induced mood disorder (HCC)  Acquired hypothyroidism  Methamphetamine use disorder, severe (HCC)  Homelessness  Chronic pain syndrome    Total Time spent with patient: 20 minutes  (Pertinent past psychiatric, medical, family, and social history obtained from patient's H&P.)    Past Psychiatric History:  Diagnoses: MDD, GAD Current psychotropic meds: effexor, abilify, buspar Previous psychotropic meds: gabapentin Psychiatrist/therapist: Almyra Jain, Alisha Webster Hospitalizations: multiple for SI Suicide attempts: multiple SIB: endorses    Past Medical History:  Dx:  has a past medical history of Anxiety, Back pain, Depression, Hyperlipidemia, and Hypertension.  Allergies: Deltasone [prednisone], Lopid [gemfibrozil], Norvasc [amlodipine], Reglan [metoclopramide], Statins, Tylenol [acetaminophen], and Zestril [lisinopril]    Family History:  No relevant family history was noted in patient's H&P.    Family Psychiatric  History:  No relevant family history was noted in patient's H&P.    Social History:  Substance Use History: Tobacco: denies Alcohol: denies Marijuana: denies Stimulant: endorses methamphetamine use for past 6-7 years and crack cocaine abuse Opioid: prescribed but history of fentanyl abuse Benzo: denies Rehab history: denies  Additional Social History:   Sleep: Poor  Appetite:  Fair  Current Medications:  Current Facility-Administered  Medications  Medication Dose Route Frequency Provider  Last Rate Last Admin   [START ON 07/15/2023] ARIPiprazole (ABILIFY) tablet 10 mg  10 mg Oral Daily Sevilla Murtagh Webster, Alisha Webster       busPIRone (BUSPAR) tablet 10 mg  10 mg Oral BID Alisha Webster, Andrew, Alisha Webster   10 mg at 07/13/23 2127   dicyclomine (BENTYL) tablet 20 mg  20 mg Oral Q6H PRN Alisha Blizzard, Alisha Webster       haloperidol (HALDOL) tablet 5 mg  5 mg Oral TID PRN Alisha Webster, Alisha Webster, Alisha Webster       And   diphenhydrAMINE (BENADRYL) capsule 50 mg  50 mg Oral TID PRN Alisha Reagin, Alisha Webster       haloperidol lactate (HALDOL) injection 5 mg  5 mg Intramuscular TID PRN Alisha Webster, Alisha Webster, Alisha Webster       And   diphenhydrAMINE (BENADRYL) injection 50 mg  50 mg Intramuscular TID PRN Alisha Webster, Alisha Webster, Alisha Webster       And   LORazepam (ATIVAN) injection 2 mg  2 mg Intramuscular TID PRN Alisha Reagin, Alisha Webster       haloperidol lactate (HALDOL) injection 10 mg  10 mg Intramuscular TID PRN Alisha Webster, Alisha Webster, Alisha Webster       And   diphenhydrAMINE (BENADRYL) injection 50 mg  50 mg Intramuscular TID PRN Alisha Webster, Alisha Webster, Alisha Webster       And   LORazepam (ATIVAN) injection 2 mg  2 mg Intramuscular TID PRN Alisha Webster, Alisha Webster, Alisha Webster       feeding supplement (ENSURE ENLIVE / ENSURE PLUS) liquid 237 mL  237 mL Oral BID BM Alisha Brandel Webster, Alisha Webster   237 mL at 07/13/23 1347   hydrochlorothiazide (HYDRODIURIL) tablet 25 mg  25 mg Oral Daily Alisha Webster, Andrew, Alisha Webster   25 mg at 07/13/23 0943   hydrOXYzine (ATARAX) tablet 25 mg  25 mg Oral Q6H PRN Alisha Blizzard, Alisha Webster       ibuprofen (ADVIL) tablet 200 mg  200 mg Oral Q6H PRN Alisha Blizzard, Alisha Webster   200 mg at 07/12/23 2149   levothyroxine (SYNTHROID) tablet 100 mcg  100 mcg Oral Daily Alisha Blizzard, Alisha Webster   100 mcg at 07/14/23 0547   loperamide (IMODIUM) capsule 2-4 mg  2-4 mg Oral PRN Alisha Blizzard, Alisha Webster       methocarbamol (ROBAXIN) tablet 500 mg  500 mg Oral Daily PRN Alisha Blizzard, Alisha Webster   500 mg at 07/13/23 2127   naproxen (NAPROSYN) tablet 500 mg  500 mg Oral BID PRN Alisha Blizzard, Alisha Webster   500 mg at 07/13/23 2129   ondansetron (ZOFRAN-ODT) disintegrating tablet 4 mg  4 mg Oral Q6H PRN Alisha Blizzard, Alisha Webster   4 mg at 07/13/23 2130   pantoprazole (PROTONIX) EC tablet 40 mg  40 mg Oral Daily Alisha Webster, Andrew, Alisha Webster   40 mg at 07/13/23 0943   venlafaxine XR (EFFEXOR-XR) 24 hr capsule 150 mg  150 mg Oral Daily Alisha Webster, Andrew, Alisha Webster   150 mg at 07/13/23 8938   Current Outpatient Medications  Medication Sig Dispense Refill   gabapentin (NEURONTIN) 600 MG tablet Take 600 mg by mouth 3 (three) times daily.     venlafaxine XR (EFFEXOR-XR) 150 MG 24 hr capsule Take 150 mg by mouth daily.     ARIPiprazole (ABILIFY) 5 MG tablet Take 5 mg by mouth daily.     busPIRone (BUSPAR) 10 MG tablet Take 10 mg by mouth 2 (two) times daily.     hydrochlorothiazide (HYDRODIURIL) 25  MG tablet Take 25 mg by mouth daily.     ibuprofen (ADVIL) 200 MG tablet Take 800 mg by mouth 2 (two) times daily.     levETIRAcetam (KEPPRA) 1000 MG tablet Take 1,000 mg by mouth 2 (two) times daily.     levothyroxine (SYNTHROID) 88 MCG tablet Take 88 mcg by mouth daily.     magnesium oxide (MAG-OX) 400 (240 Mg) MG tablet Take 400 mg by mouth daily.     methocarbamol (ROBAXIN) 500 MG tablet Take 1 tablet (500 mg total) by mouth 2 (two) times daily. (Patient taking differently: Take 500 mg by mouth daily as needed for muscle spasms.) 20 tablet 0   omeprazole (PRILOSEC) 20 MG capsule Take 20 mg by mouth daily.     oxyCODONE HCl 10 MG TABA Take 10 mg by mouth 3 (three) times daily.     potassium chloride SA (KLOR-CON M) 20 MEQ tablet Take 1 tablet (20 mEq total) by mouth daily. (Patient not taking: Reported on 07/10/2023) 7 tablet 0   Vitamin D, Ergocalciferol, (DRISDOL) 1.25 MG (50000 UNIT) CAPS capsule Take 50,000 Units by mouth every Monday.      Labs  Lab Results:  Admission on 07/10/2023  Component Date Value Ref Range Status   Sodium 07/11/2023 139  135 - 145 mmol/L Final   Potassium 07/11/2023 3.6  3.5 - 5.1 mmol/L Final   Chloride 07/11/2023 99  98 - 111 mmol/L Final   CO2 07/11/2023 28  22 - 32 mmol/L Final   Glucose, Bld 07/11/2023  103 (H)  70 - 99 mg/dL Final   Glucose reference range applies only to samples taken after fasting for at least 8 hours.   BUN 07/11/2023 15  6 - 20 mg/dL Final   Creatinine, Ser 07/11/2023 0.68  0.44 - 1.00 mg/dL Final   Calcium 81/19/1478 9.2  8.9 - 10.3 mg/dL Final   GFR, Estimated 07/11/2023 >60  >60 mL/min Final   Comment: (NOTE) Calculated using the CKD-EPI Creatinine Equation (2021)    Anion gap 07/11/2023 12  5 - 15 Final   Performed at [facility], [address], [place], [place] [address]   Magnesium 07/11/2023 2.0  1.7 - 2.4 mg/dL Final   Performed at [facility], [address], [place], [place] [address]   Cholesterol 07/14/2023 226 (H)  0 - 200 mg/dL Final   Triglycerides 57/84/6962 141  <150 mg/dL Final   HDL 95/28/4132 49  >40 mg/dL Final   Total CHOL/HDL Ratio 07/14/2023 4.6  RATIO Final   VLDL 07/14/2023 28  0 - 40 mg/dL Final   LDL Cholesterol 07/14/2023 149 (H)  0 - 99 mg/dL Final   Comment:        Total Cholesterol/HDL:CHD Risk Coronary Heart Disease Risk Table                     Men   Women  1/2 Average Risk   3.4   3.3  Average Risk       5.0   4.4  2 X Average Risk   9.6   7.1  3 X Average Risk  23.4   11.0        Use the calculated Patient Ratio above and the CHD Risk Table to determine the patient's CHD Risk.        ATP III CLASSIFICATION (LDL):  <100     mg/dL   Optimal  440-102  mg/dL   Near  or Above                    Optimal  130-159  mg/dL   Borderline  161-096  mg/dL   High  >045     mg/dL   Very High Performed at [facility], [address] [place], [place] [address]   Admission on 07/10/2023, Discharged on 07/10/2023  Component Date Value Ref Range Status   Sodium 07/10/2023 138  135 - 145 mmol/L Final   Potassium 07/10/2023 2.9 (L)  3.5 - 5.1 mmol/L Final   Chloride 07/10/2023 100  98 - 111 mmol/L Final   CO2 07/10/2023 28  22 - 32 mmol/L Final   Glucose, Bld 07/10/2023 131 (H)  70 - 99 mg/dL Final   Glucose  reference range applies only to samples taken after fasting for at least 8 hours.   BUN 07/10/2023 14  6 - 20 mg/dL Final   Creatinine, Ser 07/10/2023 0.81  0.44 - 1.00 mg/dL Final   Calcium 19/14/7829 8.7 (L)  8.9 - 10.3 mg/dL Final   Total Protein 56/21/3086 6.9  6.5 - 8.1 g/dL Final   Albumin 57/84/6962 3.5  3.5 - 5.0 g/dL Final   AST 95/28/4132 39  15 - 41 U/L Final   ALT 07/10/2023 31  0 - 44 U/L Final   Alkaline Phosphatase 07/10/2023 86  38 - 126 U/L Final   Total Bilirubin 07/10/2023 0.5  0.0 - 1.2 mg/dL Final   GFR, Estimated 07/10/2023 >60  >60 mL/min Final   Comment: (NOTE) Calculated using the CKD-EPI Creatinine Equation (2021)    Anion gap 07/10/2023 10  5 - 15 Final   Performed at [facility], [address] [place], [place] [address]   Alcohol, Ethyl (Webster) 07/10/2023 <10  <10 mg/dL Final   Comment: (NOTE) Lowest detectable limit for serum alcohol is 10 mg/dL.  For medical purposes only. Performed at [facility], [address] [place], [place] [address]    Opiates 07/10/2023 NONE DETECTED  NONE DETECTED Final   Cocaine 07/10/2023 POSITIVE (A)  NONE DETECTED Final   Benzodiazepines 07/10/2023 NONE DETECTED  NONE DETECTED Final   Amphetamines 07/10/2023 POSITIVE (A)  NONE DETECTED Final   Comment: (NOTE) Trazodone is metabolized in vivo to several metabolites, including pharmacologically active m-CPP, which is excreted in the urine. Immunoassay screens for amphetamines and MDMA have potential cross-reactivity with these compounds and may provide false positive  results.     Tetrahydrocannabinol 07/10/2023 NONE DETECTED  NONE DETECTED Final   Barbiturates 07/10/2023 NONE DETECTED  NONE DETECTED Final   Comment: (NOTE) DRUG SCREEN FOR MEDICAL PURPOSES ONLY.  IF CONFIRMATION IS NEEDED FOR ANY PURPOSE, NOTIFY LAB WITHIN 5 DAYS.  LOWEST DETECTABLE LIMITS FOR URINE DRUG SCREEN Drug Class                     Cutoff (ng/mL) Amphetamine and metabolites     1000 Barbiturate and metabolites    200 Benzodiazepine                 200 Opiates and metabolites        300 Cocaine and metabolites        300 THC                            50 Performed at [facility], [address], [place],   [address]    WBC 07/10/2023 6.9  4.0 - 10.5 K/uL Final   RBC 07/10/2023 4.50  3.87 - 5.11 MIL/uL Final   Hemoglobin 07/10/2023 13.2  12.0 - 15.0 g/dL Final   HCT 16/12/9602 39.6  36.0 - 46.0 % Final   MCV 07/10/2023 88.0  80.0 - 100.0 fL Final   MCH 07/10/2023 29.3  26.0 - 34.0 pg Final   MCHC 07/10/2023 33.3  30.0 - 36.0 g/dL Final   RDW 54/11/8117 12.9  11.5 - 15.5 % Final   Platelets 07/10/2023 323  150 - 400 K/uL Final   nRBC 07/10/2023 0.0  0.0 - 0.2 % Final   Neutrophils Relative % 07/10/2023 71  % Final   Neutro Abs 07/10/2023 4.9  1.7 - 7.7 K/uL Final   Lymphocytes Relative 07/10/2023 22  % Final   Lymphs Abs 07/10/2023 1.5  0.7 - 4.0 K/uL Final   Monocytes Relative 07/10/2023 4  % Final   Monocytes Absolute 07/10/2023 0.3  0.1 - 1.0 K/uL Final   Eosinophils Relative 07/10/2023 2  % Final   Eosinophils Absolute 07/10/2023 0.1  0.0 - 0.5 K/uL Final   Basophils Relative 07/10/2023 1  % Final   Basophils Absolute 07/10/2023 0.1  0.0 - 0.1 K/uL Final   Immature Granulocytes 07/10/2023 0  % Final   Abs Immature Granulocytes 07/10/2023 0.03  0.00 - 0.07 K/uL Final   Performed at [facility], [address] [place], [place] [address]  Admission on 04/05/2023, Discharged on 04/05/2023  Component Date Value Ref Range Status   Sodium 04/05/2023 137  135 - 145 mmol/L Final   Potassium 04/05/2023 3.7  3.5 - 5.1 mmol/L Final   Chloride 04/05/2023 104  98 - 111 mmol/L Final   CO2 04/05/2023 22  22 - 32 mmol/L Final   Glucose, Bld 04/05/2023 110 (H)  70 - 99 mg/dL Final   Glucose reference range applies only to samples taken after fasting for at least 8 hours.   BUN 04/05/2023 25 (H)  6 - 20 mg/dL Final   Creatinine, Ser 04/05/2023  0.85  0.44 - 1.00 mg/dL Final   Calcium 95/62/1308 9.0  8.9 - 10.3 mg/dL Final   GFR, Estimated 04/05/2023 >60  >60 mL/min Final   Comment: (NOTE) Calculated using the CKD-EPI Creatinine Equation (2021)    Anion gap 04/05/2023 11  5 - 15 Final   Performed at [facility], [address] [place], [place] [address]   Troponin I (High Sensitivity) 04/05/2023 5  <18 ng/L Final   Comment: (NOTE) Elevated high sensitivity troponin I (hsTnI) values and significant  changes across serial measurements may suggest ACS but many other  chronic and acute conditions are known to elevate hsTnI results.  Refer to the "Links" section for chest pain algorithms and additional  guidance. Performed at [facility], [address], [place], [place] [address]    WBC 04/05/2023 9.3  4.0 - 10.5 K/uL Final   RBC 04/05/2023 4.40  3.87 - 5.11 MIL/uL Final   Hemoglobin 04/05/2023 13.4  12.0 - 15.0 g/dL Final   HCT 52/84/1324 39.2  36.0 - 46.0 % Final   MCV 04/05/2023 89.1  80.0 - 100.0 fL Final   MCH 04/05/2023 30.5  26.0 - 34.0 pg Final   MCHC 04/05/2023 34.2  30.0 - 36.0 g/dL Final   RDW 40/12/2723 12.3  11.5 - 15.5 % Final   Platelets 04/05/2023 378  150 - 400 K/uL  Final   nRBC 04/05/2023 0.0  0.0 - 0.2 % Final   Performed at [facility], [address] [place], [place] [address]   Magnesium 04/05/2023 1.8  1.7 - 2.4 mg/dL Final   Performed at [facility], [address], [place], [place] [address]   Webster Natriuretic Peptide 04/05/2023 68.1  0.0 - 100.0 pg/mL Final   Performed at [facility], [address], [place], [place] [address]   Troponin I (High Sensitivity) 04/05/2023 5  <18 ng/L Final   Comment: (NOTE) Elevated high sensitivity troponin I (hsTnI) values and significant  changes across serial measurements may suggest ACS but many other  chronic and acute conditions are known to elevate hsTnI results.   Refer to the "Links" section for chest pain algorithms and additional  guidance. Performed at [facility], [address] [place], [place] [address]     Blood Alcohol level:  Lab Results  Component Value Date   ETH <10 07/10/2023   ETH <10 12/12/2022    Metabolic Disorder Labs: No results found for: "HGBA1C", "MPG" No results found for: "PROLACTIN" Lab Results  Component Value Date   CHOL 226 (H) 07/14/2023   TRIG 141 07/14/2023   HDL 49 07/14/2023   CHOLHDL 4.6 07/14/2023   VLDL 28 07/14/2023   LDLCALC 149 (H) 07/14/2023    Therapeutic Lab Levels: No results found for: "LITHIUM" No results found for: "VALPROATE" No results found for: "CBMZ"  Physical Findings   AUDIT    Flowsheet Row [facility]  Alcohol Use Disorder Identification Test Final Score (AUDIT) 0      PHQ2-9    Flowsheet Row ED from 07/10/2023 in [facility]  PHQ-2 Total Score 6      Flowsheet Row ED from 07/10/2023 in [facility] Most recent reading at 07/10/2023  7:11 PM ED from 07/10/2023 in [facility] Department at [facility] Most recent reading at 07/10/2023 11:38 AM ED from 04/05/2023 in [facility] Department at [facility] Most recent reading at 04/05/2023  3:35 AM  C-SSRS RISK CATEGORY High Risk High Risk No Risk        Musculoskeletal  Strength & Muscle Tone: within normal limits Gait & Station: normal Patient leans: Webster/A  Psychiatric Specialty Exam  Presentation  General Appearance:  Disheveled; Appropriate for Environment  Eye Contact: Good  Speech: Normal Rate; Clear and Coherent  Speech Volume: Normal  Handedness: Right   Mood and Affect  Mood: Irritable; Depressed ("About the same as yesterday")  Affect: Flat; Congruent   Thought Process  Thought Processes: Coherent;  Linear  Descriptions of Associations:Intact  Orientation:Full (Time, Place and Person)  Thought Content:Logical     Hallucinations:Hallucinations: None  Ideas of Reference:None  Suicidal Thoughts:Suicidal Thoughts: Yes, Active SI Active Intent and/or Plan: With Plan  Homicidal Thoughts:Homicidal Thoughts: No   Sensorium  Memory: Immediate Good; Recent Poor; Remote Poor  Judgment: Poor  Insight: Poor   Executive Functions  Concentration: Poor  Attention Span: Poor  Recall: Poor  Fund of Knowledge: Fair  Language: Fair   Psychomotor Activity  Psychomotor Activity: Psychomotor Activity: Normal   Assets  Assets: Desire for Improvement   Sleep  Sleep: Sleep: Poor   Physical Exam   Physical Exam Eyes:     Conjunctiva/sclera: Conjunctivae normal.  Pulmonary:     Effort: Pulmonary effort is  normal.  Musculoskeletal:        General: Normal range of motion.     Cervical back: Normal range of motion.  Neurological:     Mental Status: She is alert and oriented to person, place, and time.     Review of Systems  Constitutional:  Positive for diaphoresis and malaise/fatigue. Negative for chills and fever.  HENT:  Negative for hearing loss and sore throat.   Eyes:  Negative for blurred vision and double vision.  Respiratory:  Negative for cough, shortness of breath and wheezing.   Cardiovascular:  Negative for chest pain and palpitations.  Gastrointestinal:  Positive for diarrhea and nausea. Negative for constipation and vomiting.  Genitourinary:  Negative for dysuria, frequency and urgency.  Musculoskeletal:  Positive for myalgias.  Neurological:  Positive for dizziness and headaches.  Psychiatric/Behavioral:  Positive for depression, substance abuse and suicidal ideas. Negative for hallucinations. The patient is nervous/anxious.     Blood pressure 107/74, pulse 87, temperature 98.1 F (36.7 C), temperature source Oral, resp. rate 17, SpO2  99%. There is no height or weight on file to calculate BMI.  Assessment: Necole Minassian is a 53 y.o. female with a past psychiatric history of MDD, GAD, and methamphetamine use disorder and past medical history of Sjogren's syndrome, hypothyroidism, and hypertension who presented to [facility] from voluntary walk-in at [facility] for suicidal ideation with plan to overdose.  On assessment, the patient continues to require inpatient hospitalization for management of detox and active suicidal ideation. She presented less fatigued and hypersomnolent today. She has been able to get out of bed for meals, but has not been very cooperative with the nursing staff. She continues to experience dizziness, likely in the context of not having eaten or hydrated over the last few days, but voiced understanding that eating and hydrating would help her dizziness. She continues to have difficulty performing her ADLs, not having showered or groomed herself since admission- seems like this is lack of motivation vs. Not actually being physically unable. She presents with active SI linked to her current state of homelessness, stating she would actively hurt her herself if discharged without a place to stay. Multiple reports from different providers have shown that patient often presents without SI, but presents with SI during discussion of discharge and disposition planning, solidifying link to patient's current housing situation. She prefers an inpatient residential treatment plan moving forward, but voiced understanding that she her options may be limited given her recent bouts of agitation, uncooperativeness, and difficulty performing ADLs as well as the level of care the current facility is able to provide. Social work has reached out to [facility] [employer] which patient is familiar with. We also discussed increasing her Abilify dosage to 10 mg for symptomatic relief. We will continue to monitor patient's activity, medication efficacy, and side  effects with a plan to discharge patient to outpatient care by Friday.  Treatment Plan Summary:  Stimulant Use Disorder, methamphetamine Cocaine abuse MDD GAD Substance induced mood Disorder Housing Instability Concern for drug diversion Possible Opioid Use (rx oxycodone but UDS negative) - Continue COWS-most recent score is 5              - PRNs added to address symptomatic opiate withdrawal symptoms - Continue buspirone 10 mg twice daily for anxiety - Continue Effexor 150 mg daily - Increase Abilify to 10 mg daily              TSH on 05/10/2023: WNL, A1c on 05/10/2023: 6.0,  lipid panel on 07/14/2023: total chol 226, triglycerides 141, LDL 149   Hypothyroidism - Continue home Synthroid 100 mcg   Chronic pain syndrome - Continue Ibuprofen prn   GERD - Continue home Protonix 40 mg daily   Hypertension - Continue HCTZ 25 mg daily   Dispo: Patient prefers residential inpatient treatment given homelessness; Social work has reached out to [employer] [employer]; Will plan to discharge patient by Friday  Park Bolk, Medical Student  I personally was present and performed or re-performed the history and medical decision-making activities of this service and have verified that the service and findings are accurately documented in the student's note.   Joice Nares, Alisha Webster 07/14/2023 10:01 AM

## 2023-07-14 NOTE — ED Notes (Signed)
Patient was given dinner. 

## 2023-07-14 NOTE — Group Note (Signed)
 Group Topic: Communication  Group Date: 07/14/2023 Start Time: 1930 End Time: 2000 Facilitators: Wendall Halls B  Department: [facility]  Number of Participants: 5  Group Focus: abuse issues, activities of daily living skills, anger management, check in, communication, coping skills, and daily focus Treatment Modality:  Individual Therapy Interventions utilized were leisure development Purpose: express feelings, express irrational fears, increase insight, and reinforce self-care  Name: Alisha Webster Date of Birth: Dec 28, 1970  MR: [id number]    Level of Participation: PT did not attend group.  Quality of Participation: cooperative Interactions with others: gave feedback Mood/Affect: appropriate Triggers (if applicable): NA Cognition: coherent/clear Progress: None Response: NA Plan: patient will be encouraged to keep going to groups.   Patients Problems:  Patient Active Problem List   Diagnosis Date Noted   Methamphetamine use disorder, severe (HCC) 07/11/2023   Chronic pain syndrome 07/11/2023   Homelessness 07/11/2023   Suicidal ideation 07/10/2023   Substance induced mood disorder (HCC) 07/10/2023   GAD (generalized anxiety disorder) 11/24/2022   MDD (major depressive disorder) 11/23/2022   MDD (major depressive disorder), recurrent severe, without psychosis (HCC) 11/23/2022   AKI (acute kidney injury) (HCC) 11/19/2022   Anxiety and depression 11/19/2022   Hypothyroidism 11/19/2022   Sjogren's disease (HCC) 11/19/2022   Syncope 01/25/2013   Hypertension 09/02/2011   Depression 09/02/2011   Hyperlipidemia 09/02/2011   Abdominal pain 09/02/2011

## 2023-07-15 NOTE — ED Notes (Signed)
 Pt in the bedroom calm and sleeping. NAD. Will continue to monitor for safety.

## 2023-07-15 NOTE — Group Note (Signed)
 Group Topic: Identity and Relationships  Group Date: 07/15/2023 Start Time: 1930 End Time: 2000 Facilitators: Wendall Halls B  Department: [facility]  Number of Participants: 5  Group Focus: abuse issues, acceptance, check in, coping skills, daily focus, discharge education, goals/reality orientation, individual meeting, problem solving, relapse prevention, safety plan, self-awareness, self-esteem, social skills, and substance abuse education Treatment Modality:  Leisure Development Interventions utilized were leisure development, problem solving, story telling, and support Purpose: enhance coping skills, express feelings, express irrational fears, improve communication skills, increase insight, regain self-worth, reinforce self-care, and relapse prevention strategies  Name: Alisha Webster Date of Birth: 1971/03/12  MR: [id number]    Level of Participation: PT DID NOT GO TO GROUPS Quality of Participation: drowsy, isolative, oppositional, and withdrawn Interactions with others: Pt wouldn't respond when she was spoken to. Mood/Affect: appropriate, closed / guarded, and flat Triggers (if applicable): NA Cognition: coherent/clear Progress: None Response: Pt hasn't gotten out of bed since she's been here and hasn't attended any groups. For night time. When spoken to she will not respond and she won't get out the bed to come for meds.  Plan: patient will be encouraged to go to groups.   Patients Problems:  Patient Active Problem List   Diagnosis Date Noted   Methamphetamine use disorder, severe (HCC) 07/11/2023   Chronic pain syndrome 07/11/2023   Homelessness 07/11/2023   Suicidal ideation 07/10/2023   Substance induced mood disorder (HCC) 07/10/2023   GAD (generalized anxiety disorder) 11/24/2022   MDD (major depressive disorder) 11/23/2022   MDD (major depressive disorder), recurrent severe, without psychosis (HCC) 11/23/2022   AKI (acute kidney injury)  (HCC) 11/19/2022   Anxiety and depression 11/19/2022   Hypothyroidism 11/19/2022   Sjogren's disease (HCC) 11/19/2022   Syncope 01/25/2013   Hypertension 09/02/2011   Depression 09/02/2011   Hyperlipidemia 09/02/2011   Abdominal pain 09/02/2011

## 2023-07-15 NOTE — Care Management (Signed)
 2:13pm  Writer left a HIPAA compliant voice mail message.at [facility].

## 2023-07-15 NOTE — ED Notes (Signed)
 Pt sitting in dayroom stating, "I can't eat this food. The burgers are nasty. I can order something else, can't I?" Informed pt that that was the lunch option. Offered pt a salad but pt refused. Pt ate tater tots and ate cookies. Pt drank nutritional supplement with meal. No other complaints or concerns voiced. Will continue to monitor for safety.

## 2023-07-15 NOTE — ED Notes (Signed)
 Patient in the bedroom composed and and sleeping. NAD. Will continue to monitor for safety

## 2023-07-15 NOTE — Group Note (Signed)
 Group Topic: Communication  Group Date: 07/15/2023 Start Time: 0930 End Time: 1100 Facilitators: Denzil Flatten, RN  Department: [facility]  Number of Participants: 3  Group Focus: communication Treatment Modality:  Individual Therapy Interventions utilized were patient education Purpose: medication education  Name: Alisha Webster Date of Birth: [birth year]  MR: [id number]    Level of Participation: active Quality of Participation: cooperative Interactions with others: gave feedback Mood/Affect: agitated and appropriate Triggers (if applicable): none identified Cognition: coherent/clear Progress: Gaining insight Response: Pt verbalized understanding of all medication administered Plan: patient will be encouraged to remain compliant with tx plan and voice concerns about medications taken  Patients Problems:  Patient Active Problem List   Diagnosis Date Noted   Methamphetamine use disorder, severe (HCC) 07/11/2023   Chronic pain syndrome 07/11/2023   Homelessness 07/11/2023   Suicidal ideation 07/10/2023   Substance induced mood disorder (HCC) 07/10/2023   GAD (generalized anxiety disorder) 11/24/2022   MDD (major depressive disorder) 11/23/2022   MDD (major depressive disorder), recurrent severe, without psychosis (HCC) 11/23/2022   AKI (acute kidney injury) (HCC) 11/19/2022   Anxiety and depression 11/19/2022   Hypothyroidism 11/19/2022   Sjogren's disease (HCC) 11/19/2022   Syncope 01/25/2013   Hypertension 09/02/2011   Depression 09/02/2011   Hyperlipidemia 09/02/2011   Abdominal pain 09/02/2011

## 2023-07-15 NOTE — ED Notes (Signed)
 Patient resting quietly in bed with eyes closed, Respirations equal and unlabored, skin warm and dry. Patient has a bad odor and refused medication. Routine safety checks conducted according to facility protocol. Will continue to monitor for safety.

## 2023-07-15 NOTE — ED Notes (Signed)
 Pt sleeping in no acute distress. RR even and unlabored. Environment secured. Will continue to monitor for safety.

## 2023-07-15 NOTE — ED Provider Notes (Signed)
 Behavioral Health Progress Note  Date and Time: 07/15/2023 10:00 AM Name: Alisha Webster MRN:  [id number]  Alisha Webster is a 53 y.o. female with a past psychiatric history of MDD, GAD, and methamphetamine use disorder and past medical history of Sjogren's syndrome, hypothyroidism, and hypertension who presented to [facility] from voluntary walk-in at [facility] for suicidal ideation with plan to overdose.   Subjective:  Patient seen in her room laying in bed, no acute distress. When I ask her how she feels, she states "just like yesterday". When I ask the patient why she has not gone to any group sessions, she states that she is not woken up for meals and groups. She reports good sleep and good appetite. I discussed that we sent her information to residential programs and awaiting responses. I discussed the purpose of this facility is for detox and psychiatric stabilization. I states that tomorrow is her anticipated discharge day as tomorrow will be day 6 in this facility. Patient comes upset and  states "I will do what I have to do. I will walk into traffic. I am not going out there". I state that we will provide information for the local shelters and she states "I dont want to go to a shelter". She states that she currently has SI with plan to overdose due to not having housing. She denies HI and AVH.  Diagnosis:  Final diagnoses:  MDD (major depressive disorder), recurrent severe, without psychosis (HCC)  Substance induced mood disorder (HCC)  Acquired hypothyroidism  Methamphetamine use disorder, severe (HCC)  Homelessness  Chronic pain syndrome    Total Time spent with patient: 20 minutes  (Pertinent past psychiatric, medical, family, and social history obtained from patient's H&P.)    Past Psychiatric History:  Diagnoses: MDD, GAD Current psychotropic meds: effexor, abilify, buspar Previous psychotropic meds: gabapentin Psychiatrist/therapist: Almyra Jain, NP Hospitalizations: multiple  for SI Suicide attempts: multiple SIB: endorses    Past Medical History:  Dx:  has a past medical history of Anxiety, Back pain, Depression, Hyperlipidemia, and Hypertension.  Allergies: Deltasone [prednisone], Lopid [gemfibrozil], Norvasc [amlodipine], Reglan [metoclopramide], Statins, Tylenol [acetaminophen], and Zestril [lisinopril]    Family History:  No relevant family history was noted in patient's H&P.    Family Psychiatric  History:  No relevant family history was noted in patient's H&P.    Social History:  Substance Use History: Tobacco: denies Alcohol: denies Marijuana: denies Stimulant: endorses methamphetamine use for past 6-7 years and crack cocaine abuse Opioid: prescribed but history of fentanyl abuse Benzo: denies Rehab history: denies  Additional Social History:    Current Medications:  Current Facility-Administered Medications  Medication Dose Route Frequency Provider Last Rate Last Admin   ARIPiprazole (ABILIFY) tablet 10 mg  10 mg Oral Daily Abagayle Klutts B, MD       busPIRone (BUSPAR) tablet 10 mg  10 mg Oral BID Ji, Andrew, MD   10 mg at 07/14/23 2126   dicyclomine (BENTYL) tablet 20 mg  20 mg Oral Q6H PRN Augusta Blizzard, MD       haloperidol (HALDOL) tablet 5 mg  5 mg Oral TID PRN Lewis, Tanika N, NP       And   diphenhydrAMINE (BENADRYL) capsule 50 mg  50 mg Oral TID PRN Levester Reagin, NP       haloperidol lactate (HALDOL) injection 5 mg  5 mg Intramuscular TID PRN Lewis, Tanika N, NP       And   diphenhydrAMINE (BENADRYL) injection 50 mg  50 mg Intramuscular TID PRN Lewis, Tanika N, NP       And   LORazepam (ATIVAN) injection 2 mg  2 mg Intramuscular TID PRN Levester Reagin, NP       haloperidol lactate (HALDOL) injection 10 mg  10 mg Intramuscular TID PRN Lewis, Tanika N, NP       And   diphenhydrAMINE (BENADRYL) injection 50 mg  50 mg Intramuscular TID PRN Lewis, Tanika N, NP       And   LORazepam (ATIVAN) injection 2 mg  2 mg Intramuscular TID  PRN Levester Reagin, NP       feeding supplement (ENSURE ENLIVE / ENSURE PLUS) liquid 237 mL  237 mL Oral BID BM Lafonda Patron B, MD   237 mL at 07/14/23 1437   hydrochlorothiazide (HYDRODIURIL) tablet 25 mg  25 mg Oral Daily Ji, Andrew, MD   25 mg at 07/14/23 1028   hydrOXYzine (ATARAX) tablet 25 mg  25 mg Oral Q6H PRN Augusta Blizzard, MD       ibuprofen (ADVIL) tablet 200 mg  200 mg Oral Q6H PRN Augusta Blizzard, MD   200 mg at 07/14/23 1032   levothyroxine (SYNTHROID) tablet 100 mcg  100 mcg Oral Daily Augusta Blizzard, MD   100 mcg at 07/15/23 0617   loperamide (IMODIUM) capsule 2-4 mg  2-4 mg Oral PRN Augusta Blizzard, MD       methocarbamol (ROBAXIN) tablet 500 mg  500 mg Oral Daily PRN Augusta Blizzard, MD   500 mg at 07/13/23 2127   naproxen (NAPROSYN) tablet 500 mg  500 mg Oral BID PRN Augusta Blizzard, MD   500 mg at 07/13/23 2129   ondansetron (ZOFRAN-ODT) disintegrating tablet 4 mg  4 mg Oral Q6H PRN Augusta Blizzard, MD   4 mg at 07/13/23 2130   pantoprazole (PROTONIX) EC tablet 40 mg  40 mg Oral Daily Ji, Andrew, MD   40 mg at 07/14/23 1028   venlafaxine XR (EFFEXOR-XR) 24 hr capsule 150 mg  150 mg Oral Daily Ji, Andrew, MD   150 mg at 07/14/23 1028   Current Outpatient Medications  Medication Sig Dispense Refill   gabapentin (NEURONTIN) 600 MG tablet Take 600 mg by mouth 3 (three) times daily.     venlafaxine XR (EFFEXOR-XR) 150 MG 24 hr capsule Take 150 mg by mouth daily.     ARIPiprazole (ABILIFY) 5 MG tablet Take 5 mg by mouth daily.     busPIRone (BUSPAR) 10 MG tablet Take 10 mg by mouth 2 (two) times daily.     hydrochlorothiazide (HYDRODIURIL) 25 MG tablet Take 25 mg by mouth daily.     ibuprofen (ADVIL) 200 MG tablet Take 800 mg by mouth 2 (two) times daily.     levETIRAcetam (KEPPRA) 1000 MG tablet Take 1,000 mg by mouth 2 (two) times daily.     levothyroxine (SYNTHROID) 88 MCG tablet Take 88 mcg by mouth daily.     magnesium oxide (MAG-OX) 400 (240 Mg) MG tablet Take 400 mg by mouth daily.     methocarbamol  (ROBAXIN) 500 MG tablet Take 1 tablet (500 mg total) by mouth 2 (two) times daily. (Patient taking differently: Take 500 mg by mouth daily as needed for muscle spasms.) 20 tablet 0   omeprazole (PRILOSEC) 20 MG capsule Take 20 mg by mouth daily.     oxyCODONE HCl 10 MG TABA Take 10 mg by mouth 3 (three) times daily.     potassium chloride SA (KLOR-CON  M) 20 MEQ tablet Take 1 tablet (20 mEq total) by mouth daily. (Patient not taking: Reported on 07/10/2023) 7 tablet 0   Vitamin D, Ergocalciferol, (DRISDOL) 1.25 MG (50000 UNIT) CAPS capsule Take 50,000 Units by mouth every Monday.      Labs  Lab Results:  Admission on 07/10/2023  Component Date Value Ref Range Status   Sodium 07/11/2023 139  135 - 145 mmol/L Final   Potassium 07/11/2023 3.6  3.5 - 5.1 mmol/L Final   Chloride 07/11/2023 99  98 - 111 mmol/L Final   CO2 07/11/2023 28  22 - 32 mmol/L Final   Glucose, Bld 07/11/2023 103 (H)  70 - 99 mg/dL Final   Glucose reference range applies only to samples taken after fasting for at least 8 hours.   BUN 07/11/2023 15  6 - 20 mg/dL Final   Creatinine, Ser 07/11/2023 0.68  0.44 - 1.00 mg/dL Final   Calcium 16/12/9602 9.2  8.9 - 10.3 mg/dL Final   GFR, Estimated 07/11/2023 >60  >60 mL/min Final   Comment: (NOTE) Calculated using the CKD-EPI Creatinine Equation (2021)    Anion gap 07/11/2023 12  5 - 15 Final   Performed at [facility], [address], [place], [place] [address]   Magnesium 07/11/2023 2.0  1.7 - 2.4 mg/dL Final   Performed at [facility], [address], [place], [place] [address]   Cholesterol 07/14/2023 226 (H)  0 - 200 mg/dL Final   Triglycerides 78/29/5621 141  <150 mg/dL Final   HDL 30/86/5784 49  >40 mg/dL Final   Total CHOL/HDL Ratio 07/14/2023 4.6  RATIO Final   VLDL 07/14/2023 28  0 - 40 mg/dL Final   LDL Cholesterol 07/14/2023 149 (H)  0 - 99 mg/dL Final   Comment:        Total Cholesterol/HDL:CHD Risk Coronary Heart Disease Risk Table                      Men   Women  1/2 Average Risk   3.4   3.3  Average Risk       5.0   4.4  2 X Average Risk   9.6   7.1  3 X Average Risk  23.4   11.0        Use the calculated Patient Ratio above and the CHD Risk Table to determine the patient's CHD Risk.        ATP III CLASSIFICATION (LDL):  <100     mg/dL   Optimal  696-295  mg/dL   Near or Above                    Optimal  130-159  mg/dL   Borderline  284-132  mg/dL   High  >440     mg/dL   Very High Performed at [facility], [address] [place], [place] [address]   Admission on 07/10/2023, Discharged on 07/10/2023  Component Date Value Ref Range Status   Sodium 07/10/2023 138  135 - 145 mmol/L Final   Potassium 07/10/2023 2.9 (L)  3.5 - 5.1 mmol/L Final   Chloride 07/10/2023 100  98 - 111 mmol/L Final   CO2 07/10/2023 28  22 - 32 mmol/L Final   Glucose, Bld 07/10/2023 131 (H)  70 - 99 mg/dL Final   Glucose reference range applies only to samples taken after fasting for at least 8 hours.   BUN 07/10/2023  14  6 - 20 mg/dL Final   Creatinine, Ser 07/10/2023 0.81  0.44 - 1.00 mg/dL Final   Calcium 29/56/2130 8.7 (L)  8.9 - 10.3 mg/dL Final   Total Protein 86/57/8469 6.9  6.5 - 8.1 g/dL Final   Albumin 62/95/2841 3.5  3.5 - 5.0 g/dL Final   AST 32/44/0102 39  15 - 41 U/L Final   ALT 07/10/2023 31  0 - 44 U/L Final   Alkaline Phosphatase 07/10/2023 86  38 - 126 U/L Final   Total Bilirubin 07/10/2023 0.5  0.0 - 1.2 mg/dL Final   GFR, Estimated 07/10/2023 >60  >60 mL/min Final   Comment: (NOTE) Calculated using the CKD-EPI Creatinine Equation (2021)    Anion gap 07/10/2023 10  5 - 15 Final   Performed at [facility], [address] [place], [place] [address]   Alcohol, Ethyl (B) 07/10/2023 <10  <10 mg/dL Final   Comment: (NOTE) Lowest detectable limit for serum alcohol is 10 mg/dL.  For medical purposes only. Performed at [facility], [address] [place], [place] [address]    Opiates 07/10/2023 NONE  DETECTED  NONE DETECTED Final   Cocaine 07/10/2023 POSITIVE (A)  NONE DETECTED Final   Benzodiazepines 07/10/2023 NONE DETECTED  NONE DETECTED Final   Amphetamines 07/10/2023 POSITIVE (A)  NONE DETECTED Final   Comment: (NOTE) Trazodone is metabolized in vivo to several metabolites, including pharmacologically active m-CPP, which is excreted in the urine. Immunoassay screens for amphetamines and MDMA have potential cross-reactivity with these compounds and may provide false positive  results.     Tetrahydrocannabinol 07/10/2023 NONE DETECTED  NONE DETECTED Final   Barbiturates 07/10/2023 NONE DETECTED  NONE DETECTED Final   Comment: (NOTE) DRUG SCREEN FOR MEDICAL PURPOSES ONLY.  IF CONFIRMATION IS NEEDED FOR ANY PURPOSE, NOTIFY LAB WITHIN 5 DAYS.  LOWEST DETECTABLE LIMITS FOR URINE DRUG SCREEN Drug Class                     Cutoff (ng/mL) Amphetamine and metabolites    1000 Barbiturate and metabolites    200 Benzodiazepine                 200 Opiates and metabolites        300 Cocaine and metabolites        300 THC                            50 Performed at [facility], [address], [place], [place] [address]    WBC 07/10/2023 6.9  4.0 - 10.5 K/uL Final   RBC 07/10/2023 4.50  3.87 - 5.11 MIL/uL Final   Hemoglobin 07/10/2023 13.2  12.0 - 15.0 g/dL Final   HCT 95/63/8756 39.6  36.0 - 46.0 % Final   MCV 07/10/2023 88.0  80.0 - 100.0 fL Final   MCH 07/10/2023 29.3  26.0 - 34.0 pg Final   MCHC 07/10/2023 33.3  30.0 - 36.0 g/dL Final   RDW 43/32/9518 12.9  11.5 - 15.5 % Final   Platelets 07/10/2023 323  150 - 400 K/uL Final   nRBC 07/10/2023 0.0  0.0 - 0.2 % Final   Neutrophils Relative % 07/10/2023 71  % Final   Neutro Abs 07/10/2023 4.9  1.7 - 7.7 K/uL Final   Lymphocytes Relative 07/10/2023 22  % Final   Lymphs Abs 07/10/2023 1.5  0.7 - 4.0 K/uL Final  Monocytes Relative 07/10/2023 4  % Final   Monocytes Absolute 07/10/2023 0.3  0.1 - 1.0 K/uL Final    Eosinophils Relative 07/10/2023 2  % Final   Eosinophils Absolute 07/10/2023 0.1  0.0 - 0.5 K/uL Final   Basophils Relative 07/10/2023 1  % Final   Basophils Absolute 07/10/2023 0.1  0.0 - 0.1 K/uL Final   Immature Granulocytes 07/10/2023 0  % Final   Abs Immature Granulocytes 07/10/2023 0.03  0.00 - 0.07 K/uL Final   Performed at [facility], [address] [place], [place] [address]  Admission on 04/05/2023, Discharged on 04/05/2023  Component Date Value Ref Range Status   Sodium 04/05/2023 137  135 - 145 mmol/L Final   Potassium 04/05/2023 3.7  3.5 - 5.1 mmol/L Final   Chloride 04/05/2023 104  98 - 111 mmol/L Final   CO2 04/05/2023 22  22 - 32 mmol/L Final   Glucose, Bld 04/05/2023 110 (H)  70 - 99 mg/dL Final   Glucose reference range applies only to samples taken after fasting for at least 8 hours.   BUN 04/05/2023 25 (H)  6 - 20 mg/dL Final   Creatinine, Ser 04/05/2023 0.85  0.44 - 1.00 mg/dL Final   Calcium 60/45/4098 9.0  8.9 - 10.3 mg/dL Final   GFR, Estimated 04/05/2023 >60  >60 mL/min Final   Comment: (NOTE) Calculated using the CKD-EPI Creatinine Equation (2021)    Anion gap 04/05/2023 11  5 - 15 Final   Performed at [facility], [address], [place], [place] [address]   Troponin I (High Sensitivity) 04/05/2023 5  <18 ng/L Final   Comment: (NOTE) Elevated high sensitivity troponin I (hsTnI) values and significant  changes across serial measurements may suggest ACS but many other  chronic and acute conditions are known to elevate hsTnI results.  Refer to the "Links" section for chest pain algorithms and additional  guidance. Performed at [facility], [address], [place], [place] [address]    WBC 04/05/2023 9.3  4.0 - 10.5 K/uL Final   RBC 04/05/2023 4.40  3.87 - 5.11 MIL/uL Final   Hemoglobin 04/05/2023 13.4  12.0 - 15.0 g/dL Final   HCT 62/13/0865 39.2  36.0 - 46.0 % Final   MCV 04/05/2023 89.1  80.0 - 100.0 fL  Final   MCH 04/05/2023 30.5  26.0 - 34.0 pg Final   MCHC 04/05/2023 34.2  30.0 - 36.0 g/dL Final   RDW 78/46/9629 12.3  11.5 - 15.5 % Final   Platelets 04/05/2023 378  150 - 400 K/uL Final   nRBC 04/05/2023 0.0  0.0 - 0.2 % Final   Performed at [facility], [address], [place], [place] [address]   Magnesium 04/05/2023 1.8  1.7 - 2.4 mg/dL Final   Performed at [facility], [address] [place], [place] [address]   B Natriuretic Peptide 04/05/2023 68.1  0.0 - 100.0 pg/mL Final   Performed at [facility], [address], [place], [place] [address]   Troponin I (High Sensitivity) 04/05/2023 5  <18 ng/L Final   Comment: (NOTE) Elevated high sensitivity troponin I (hsTnI) values and significant  changes across serial measurements may suggest ACS but many other  chronic and acute conditions are known to elevate hsTnI results.  Refer to the "Links" section for chest pain algorithms and additional  guidance. Performed at [facility] [address] [place], [place] [address]  Blood Alcohol level:  Lab Results  Component Value Date   ETH <10 07/10/2023   ETH <10 12/12/2022    Metabolic Disorder Labs: No results found for: "HGBA1C", "MPG" No results found for: "PROLACTIN" Lab Results  Component Value Date   CHOL 226 (H) 07/14/2023   TRIG 141 07/14/2023   HDL 49 07/14/2023   CHOLHDL 4.6 07/14/2023   VLDL 28 07/14/2023   LDLCALC 149 (H) 07/14/2023    Therapeutic Lab Levels: No results found for: "LITHIUM" No results found for: "VALPROATE" No results found for: "CBMZ"  Physical Findings   AUDIT    Flowsheet Row [facility]  Alcohol Use Disorder Identification Test Final Score (AUDIT) 0      PHQ2-9    Flowsheet Row ED from 07/10/2023 in [facility]  PHQ-2 Total Score 6       Flowsheet Row ED from 07/10/2023 in [facility] Most recent reading at 07/10/2023  7:11 PM ED from 07/10/2023 in [facility] Emergency Department at [facility] Most recent reading at 07/10/2023 11:38 AM ED from 04/05/2023 in [facility] Emergency Department at [facility] Most recent reading at 04/05/2023  3:35 AM  C-SSRS RISK CATEGORY High Risk High Risk No Risk        Musculoskeletal  Strength & Muscle Tone: within normal limits Gait & Station: normal Patient leans: N/A  Psychiatric Specialty Exam  Presentation  General Appearance:  Appropriate for Environment; Fairly Groomed  Eye Contact: Fair  Speech: Clear and Coherent  Speech Volume: Increased  Handedness: Right   Mood and Affect  Mood: Irritable  Affect: Congruent; Inappropriate   Thought Process  Thought Processes: Linear  Descriptions of Associations:Intact  Orientation:Full (Time, Place and Person)  Thought Content:Logical     Hallucinations:Hallucinations: None  Ideas of Reference:None  Suicidal Thoughts:Suicidal Thoughts: Yes, Active SI Active Intent and/or Plan: With Plan; Without Means to Carry Out; Without Access to Means  Homicidal Thoughts:Homicidal Thoughts: No   Sensorium  Memory: Remote Good  Judgment: Poor  Insight: Poor   Executive Functions  Concentration: Poor  Attention Span: Poor  Recall: [employer] of Knowledge: Fair  Language: Fair   Psychomotor Activity  Psychomotor Activity: Psychomotor Activity: Normal   Assets  Assets: Resilience   Sleep  Sleep: Sleep: Good   Physical Exam   Physical Exam Eyes:     Conjunctiva/sclera: Conjunctivae normal.  Pulmonary:     Effort: Pulmonary effort is normal.  Musculoskeletal:        General: Normal range of motion.     Cervical back: Normal range of motion.  Neurological:     Mental Status: She is alert and oriented to person, place, and time.      Review of Systems  Constitutional:  Positive for diaphoresis and malaise/fatigue. Negative for chills and fever.  HENT:  Negative for hearing loss and sore throat.   Eyes:  Negative for blurred vision and double vision.  Respiratory:  Negative for cough, shortness of breath and wheezing.   Cardiovascular:  Negative for chest pain and palpitations.  Gastrointestinal:  Negative for constipation and vomiting.  Genitourinary:  Negative for dysuria, frequency and urgency.  Musculoskeletal:  Positive for myalgias.  Psychiatric/Behavioral:  Negative for hallucinations.     Blood pressure 124/75, pulse 87, temperature 98.1 F (36.7 C), temperature source Oral, resp. rate 16, SpO2 96%. There is no height or weight on file  to calculate BMI.  Assessment: Darbi Chandran is a 53 y.o. female with a past psychiatric history of MDD, GAD, and methamphetamine use disorder and past medical history of Sjogren's syndrome, hypothyroidism, and hypertension who presented to [facility] from voluntary walk-in at [facility] for suicidal ideation with plan to overdose.  Patient is not participating in her management and treatment- not going to group and refusing to eat most of her meals. Transition to outpatient care is essential for ongoing treatment and recovery. Patient's symptoms including active suicidal ideation shows to be closely connected with psychosocial factors including unstable housing, continued substance use, difficulty with finances, and lack of support. Fortunately, her withdrawal symptoms are improving. Plan for discharge on Friday.   Treatment Plan Summary:  Stimulant Use Disorder, methamphetamine Cocaine abuse MDD GAD Substance induced mood Disorder Housing Instability Concern for drug diversion Possible Opioid Use (rx oxycodone but UDS negative) - Continue COWS-most recent score is 2             - PRNs added to address symptomatic opiate withdrawal symptoms - Continue buspirone 10 mg twice  daily for anxiety - Continue Effexor 150 mg daily - Continue Abilify 10 mg daily              TSH on 05/10/2023: WNL, A1c on 05/10/2023: 6.0, lipid panel on 07/14/2023: total chol 226, triglycerides 141, LDL 149   Hypothyroidism - Continue home Synthroid 100 mcg   Chronic pain syndrome - Continue Ibuprofen prn   GERD - Continue home Protonix 40 mg daily   Hypertension - Continue HCTZ 25 mg daily   Dispo: Patient prefers residential inpatient treatment given homelessness; Social work has reached out to [employer]; Will plan to discharge patient by Friday    Joice Nares, MD 07/15/2023 10:00 AM

## 2023-07-15 NOTE — ED Notes (Addendum)
 Pt Denies intent to harm self/others when asked. Denies A/VH. Patient denies any physical complaints when asked. No acute distress noted. Support and encouragement provided. Routine safety checks conducted according to facility protocol. Encouraged patient to notify staff if thoughts of harm toward self or others arise. Patient verbalize understanding and agreement. Will continue to monitor for safety.

## 2023-07-16 ENCOUNTER — Encounter (HOSPITAL_COMMUNITY): Payer: Self-pay | Admitting: Emergency Medicine

## 2023-07-16 ENCOUNTER — Emergency Department (HOSPITAL_COMMUNITY)
Admission: EM | Admit: 2023-07-16 | Discharge: 2023-07-16 | Disposition: A | Discharge status: Home / Self Care | Payer: MEDICAID | Attending: Emergency Medicine | Admitting: Emergency Medicine

## 2023-07-16 ENCOUNTER — Encounter (HOSPITAL_COMMUNITY): Payer: Self-pay

## 2023-07-16 ENCOUNTER — Emergency Department (HOSPITAL_COMMUNITY)
Admission: EM | Admit: 2023-07-16 | Discharge: 2023-07-19 | Disposition: A | Discharge status: Other Acute Inpatient Hospital | Payer: MEDICAID | Attending: Emergency Medicine | Admitting: Emergency Medicine

## 2023-07-16 ENCOUNTER — Other Ambulatory Visit: Payer: Self-pay

## 2023-07-16 DIAGNOSIS — F603 Borderline personality disorder: Secondary | ICD-10-CM | POA: Insufficient documentation

## 2023-07-16 DIAGNOSIS — X838XXA Intentional self-harm by other specified means, initial encounter: Secondary | ICD-10-CM | POA: Insufficient documentation

## 2023-07-16 DIAGNOSIS — F332 Major depressive disorder, recurrent severe without psychotic features: Secondary | ICD-10-CM | POA: Insufficient documentation

## 2023-07-16 DIAGNOSIS — F1520 Other stimulant dependence, uncomplicated: Secondary | ICD-10-CM | POA: Diagnosis present

## 2023-07-16 DIAGNOSIS — R112 Nausea with vomiting, unspecified: Secondary | ICD-10-CM | POA: Insufficient documentation

## 2023-07-16 DIAGNOSIS — Z91148 Patient's other noncompliance with medication regimen for other reason: Secondary | ICD-10-CM | POA: Diagnosis not present

## 2023-07-16 DIAGNOSIS — E860 Dehydration: Secondary | ICD-10-CM | POA: Diagnosis not present

## 2023-07-16 DIAGNOSIS — R42 Dizziness and giddiness: Secondary | ICD-10-CM | POA: Insufficient documentation

## 2023-07-16 DIAGNOSIS — Z5900 Homelessness unspecified: Secondary | ICD-10-CM | POA: Diagnosis not present

## 2023-07-16 DIAGNOSIS — T43592A Poisoning by other antipsychotics and neuroleptics, intentional self-harm, initial encounter: Secondary | ICD-10-CM | POA: Insufficient documentation

## 2023-07-16 DIAGNOSIS — R45851 Suicidal ideations: Secondary | ICD-10-CM

## 2023-07-16 DIAGNOSIS — T50902A Poisoning by unspecified drugs, medicaments and biological substances, intentional self-harm, initial encounter: Secondary | ICD-10-CM

## 2023-07-16 DIAGNOSIS — T438X2A Poisoning by other psychotropic drugs, intentional self-harm, initial encounter: Secondary | ICD-10-CM | POA: Diagnosis present

## 2023-07-16 DIAGNOSIS — Z765 Malingerer [conscious simulation]: Secondary | ICD-10-CM

## 2023-07-16 LAB — CBC WITH DIFFERENTIAL/PLATELET
Abs Immature Granulocytes: 0.03 10*3/uL (ref 0.00–0.07)
Abs Immature Granulocytes: 0.02 10*3/uL (ref 0.00–0.07)
Basophils Absolute: 0.1 10*3/uL (ref 0.0–0.1)
Basophils Absolute: 0.1 10*3/uL (ref 0.0–0.1)
Basophils Relative: 1 %
Basophils Relative: 1 %
Eosinophils Absolute: 0.2 10*3/uL (ref 0.0–0.5)
Eosinophils Absolute: 0.2 10*3/uL (ref 0.0–0.5)
Eosinophils Relative: 2 %
Eosinophils Relative: 3 %
HCT: 41.9 % (ref 36.0–46.0)
HCT: 42 % (ref 36.0–46.0)
Hemoglobin: 13.8 g/dL (ref 12.0–15.0)
Hemoglobin: 13.6 g/dL (ref 12.0–15.0)
Immature Granulocytes: 0 %
Immature Granulocytes: 0 %
Lymphocytes Relative: 39 %
Lymphocytes Relative: 45 %
Lymphs Abs: 3.4 10*3/uL (ref 0.7–4.0)
Lymphs Abs: 3.5 10*3/uL (ref 0.7–4.0)
MCH: 29.6 pg (ref 26.0–34.0)
MCH: 29.1 pg (ref 26.0–34.0)
MCHC: 32.9 g/dL (ref 30.0–36.0)
MCHC: 32.4 g/dL (ref 30.0–36.0)
MCV: 89.7 fL (ref 80.0–100.0)
MCV: 89.7 fL (ref 80.0–100.0)
Monocytes Absolute: 0.7 10*3/uL (ref 0.1–1.0)
Monocytes Absolute: 0.5 10*3/uL (ref 0.1–1.0)
Monocytes Relative: 8 %
Monocytes Relative: 7 %
Neutro Abs: 4.3 10*3/uL (ref 1.7–7.7)
Neutro Abs: 3.4 10*3/uL (ref 1.7–7.7)
Neutrophils Relative %: 50 %
Neutrophils Relative %: 44 %
Platelets: 345 10*3/uL (ref 150–400)
Platelets: 356 10*3/uL (ref 150–400)
RBC: 4.67 MIL/uL (ref 3.87–5.11)
RBC: 4.68 MIL/uL (ref 3.87–5.11)
RDW: 12.8 % (ref 11.5–15.5)
RDW: 12.9 % (ref 11.5–15.5)
WBC: 8.6 10*3/uL (ref 4.0–10.5)
WBC: 7.8 10*3/uL (ref 4.0–10.5)
nRBC: 0 % (ref 0.0–0.2)
nRBC: 0 % (ref 0.0–0.2)

## 2023-07-16 LAB — URINALYSIS, ROUTINE W REFLEX MICROSCOPIC
Bilirubin Urine: NEGATIVE
Glucose, UA: NEGATIVE mg/dL
Hgb urine dipstick: NEGATIVE
Ketones, ur: NEGATIVE mg/dL
Nitrite: NEGATIVE
Protein, ur: 30 mg/dL — AB
Specific Gravity, Urine: 1.02 (ref 1.005–1.030)
pH: 6 (ref 5.0–8.0)

## 2023-07-16 LAB — COMPREHENSIVE METABOLIC PANEL WITH GFR
ALT: 41 U/L (ref 0–44)
ALT: 39 U/L (ref 0–44)
AST: 50 U/L — ABNORMAL HIGH (ref 15–41)
AST: 40 U/L (ref 15–41)
Albumin: 3.8 g/dL (ref 3.5–5.0)
Albumin: 3.5 g/dL (ref 3.5–5.0)
Alkaline Phosphatase: 87 U/L (ref 38–126)
Alkaline Phosphatase: 84 U/L (ref 38–126)
Anion gap: 15 (ref 5–15)
Anion gap: 10 (ref 5–15)
BUN: 33 mg/dL — ABNORMAL HIGH (ref 6–20)
BUN: 29 mg/dL — ABNORMAL HIGH (ref 6–20)
CO2: 23 mmol/L (ref 22–32)
CO2: 28 mmol/L (ref 22–32)
Calcium: 9.2 mg/dL (ref 8.9–10.3)
Calcium: 9 mg/dL (ref 8.9–10.3)
Chloride: 100 mmol/L (ref 98–111)
Chloride: 98 mmol/L (ref 98–111)
Creatinine, Ser: 0.63 mg/dL (ref 0.44–1.00)
Creatinine, Ser: 1.13 mg/dL — ABNORMAL HIGH (ref 0.44–1.00)
GFR, Estimated: >60 mL/min (ref >60)
GFR, Estimated: 59 mL/min — ABNORMAL LOW (ref >60)
Glucose, Bld: 99 mg/dL (ref 70–99)
Glucose, Bld: 169 mg/dL — ABNORMAL HIGH (ref 70–99)
Potassium: 3.9 mmol/L (ref 3.5–5.1)
Potassium: 3 mmol/L — ABNORMAL LOW (ref 3.5–5.1)
Sodium: 138 mmol/L (ref 135–145)
Sodium: 136 mmol/L (ref 135–145)
Total Bilirubin: 0.8 mg/dL (ref 0.0–1.2)
Total Bilirubin: 0.6 mg/dL (ref 0.0–1.2)
Total Protein: 7 g/dL (ref 6.5–8.1)
Total Protein: 6.8 g/dL (ref 6.5–8.1)

## 2023-07-16 LAB — RAPID URINE DRUG SCREEN, HOSP PERFORMED
Amphetamines: NOT DETECTED
Barbiturates: NOT DETECTED
Benzodiazepines: NOT DETECTED
Cocaine: NOT DETECTED
Opiates: NOT DETECTED
Tetrahydrocannabinol: NOT DETECTED

## 2023-07-16 LAB — ETHANOL
Alcohol, Ethyl (B): <10 mg/dL (ref <10)
Alcohol, Ethyl (B): <10 mg/dL (ref <10)

## 2023-07-16 LAB — SALICYLATE LEVEL
Salicylate Lvl: <7 mg/dL — ABNORMAL LOW (ref 7.0–30.0)
Salicylate Lvl: <7 mg/dL — ABNORMAL LOW (ref 7.0–30.0)

## 2023-07-16 LAB — TROPONIN I (HIGH SENSITIVITY): Troponin I (High Sensitivity): 5 ng/L (ref <18)

## 2023-07-16 LAB — ACETAMINOPHEN LEVEL
Acetaminophen (Tylenol), Serum: <10 ug/mL — ABNORMAL LOW (ref 10–30)
Acetaminophen (Tylenol), Serum: <10 ug/mL — ABNORMAL LOW (ref 10–30)

## 2023-07-16 LAB — MAGNESIUM: Magnesium: 1.8 mg/dL (ref 1.7–2.4)

## 2023-07-16 MED ORDER — LEVOTHYROXINE SODIUM 100 MCG PO TABS: 100.0000 ug | ORAL_TABLET | Freq: Every day | ORAL | Status: DC

## 2023-07-16 MED ORDER — BUSPIRONE HCL 10 MG PO TABS: 10.0000 mg | ORAL_TABLET | Freq: Two times a day (BID) | ORAL | 0 refills | Status: AC

## 2023-07-16 MED ORDER — LACTATED RINGERS IV BOLUS
1000.0000 mL | Freq: Once | INTRAVENOUS | Status: AC
  Administered 2023-07-16: 1000 mL via INTRAVENOUS

## 2023-07-16 MED ORDER — ARIPIPRAZOLE 10 MG PO TABS: 10.0000 mg | ORAL_TABLET | Freq: Every day | ORAL | 0 refills | Status: AC

## 2023-07-16 MED ORDER — PANTOPRAZOLE SODIUM 40 MG PO TBEC: 40.0000 mg | DELAYED_RELEASE_TABLET | Freq: Every day | ORAL | Status: DC

## 2023-07-16 MED ORDER — ACETAMINOPHEN 325 MG PO TABS
650.0000 mg | ORAL_TABLET | Freq: Once | ORAL | Status: AC
  Administered 2023-07-16: 650 mg via ORAL
  Filled 2023-07-16: qty 2

## 2023-07-16 MED ORDER — VENLAFAXINE HCL ER 150 MG PO CP24: 150.0000 mg | ORAL_CAPSULE | Freq: Every day | ORAL | 0 refills | Status: AC

## 2023-07-16 NOTE — ED Provider Notes (Addendum)
 Patient handed off to me awaiting remaining lab work to be medically clear for psychiatry evaluation.  She is endorsing SI.  Medical screening labs are unremarkable.  Troponin normal.  Electrolytes unremarkable.  No significant anemia electrolyte abnormality.   However, when I talk with the patient does sound like most of her concerns about her social situation.  She was actually just discharged from psychiatry inpatient earlier this morning.  Per my review of that note they did think there was a secondary gain and malingering component to her symptoms.  Ultimately after discussion with her it sounds like she is struggling with housing instability.  She went to shelter today that was closed.  I will try to see if social work can try to help with resources but I do not think psychiatry evaluation is imminently needed at this time as she just spent 6 days inpatient.  Has significant evaluation.  You can review Dr. Ginny note with psychiatry who overall seems like she is given several options for substance abuse treatment and housing but she refused.  Ultimately the best I can do at this time is give her some outpatient resources and some shelter resources but I do not think psychiatry evaluation is needed at this time.  Overall continue to talk with the patient.  I do feel more comfortable that she is psychiatrically okay, likely malingering due to homelessness.  I was able to find the charger for phone and charger phone.  Able to give her some food and give her food for the road.  Ultimately I think that her main issues are housing instability at this time.  She understands that we are limited in these resources but I have provided her with numbers for shelters and substance abuse counseling.  Transitions of care team unable to find shelter for her at this time at night.   This chart was dictated using voice recognition software.  Despite best efforts to proofread,  errors can occur which can change the  documentation meaning.    Ruthe Cornet, DO 07/16/23 1729    Ruthe Cornet, DO 07/16/23 1810    Ruthe Cornet, DO 07/16/23 1821    Ruthe Cornet, DO 07/16/23 1829    Ruthe Cornet, DO 07/16/23 [phone]

## 2023-07-16 NOTE — ED Notes (Signed)
 Patient discharged in no acute distress. Belongings returned from locker #25. AVS given and reviewed. Pt voiced understanding.7-day supply of medication given. Pt escorted to the back sally port for transport to [facility] via [employer]. Safety maintained.

## 2023-07-16 NOTE — ED Provider Notes (Signed)
 [place] EMERGENCY DEPARTMENT AT [facility] Provider Note   CSN: [id number] Arrival date & time: 07/16/23  2154     History  Chief Complaint  Patient presents with   Suicidal    Alisha Webster is a 53 y.o. female.  Patient returns to the ED now stating that she took a bunch of Abilify  BuSpar  to herself.  I had just discharged her and she went to the parking lot and took these medications.  She called 911 afterwards.  She denies any chest pain shortness of breath weakness numbness tingling.  Normal vitals with EMS.  She states that she wants to kill herself and does not want help.  She denies any nausea vomit diarrhea.  Denies any other ingestion.  The history is provided by the patient.       Home Medications Prior to Admission medications   Medication Sig Start Date End Date Taking? Authorizing Provider  ARIPiprazole  (ABILIFY ) 10 MG tablet Take 1 tablet (10 mg total) by mouth daily. Patient not taking: Reported on 07/16/2023 07/17/23   Joice Nares, MD  ARIPiprazole  (ABILIFY ) 5 MG tablet Take 5 mg by mouth daily.    [provider]  busPIRone  (BUSPAR ) 10 MG tablet Take 1 tablet (10 mg total) by mouth 2 (two) times daily. 07/16/23   Joice Nares, MD  gabapentin  (NEURONTIN ) 600 MG tablet Take 600 mg by mouth 3 (three) times daily.    [provider]  hydrochlorothiazide  (HYDRODIURIL ) 25 MG tablet Take 25 mg by mouth daily.    [provider]  ibuprofen  (ADVIL ) 200 MG tablet Take 400-800 mg by mouth 2 (two) times daily as needed for mild pain (pain score 1-3) or moderate pain (pain score 4-6).    [provider]  levothyroxine  (SYNTHROID ) 100 MCG tablet Take 100 mcg by mouth daily before breakfast.    [provider]  magnesium  oxide (MAG-OX) 400 (240 Mg) MG tablet Take 400 mg by mouth daily.    [provider]  omeprazole (PRILOSEC) 20 MG capsule Take 20 mg by mouth daily. 09/03/22 09/04/23  [provider]  venlafaxine  XR (EFFEXOR -XR) 150 MG 24 hr capsule Take 1 capsule (150 mg total) by mouth daily. 07/16/23   Joice Nares, MD      Allergies    Deltasone [prednisone], Lopid [gemfibrozil], Norvasc  [amlodipine ], Reglan  [metoclopramide ], Statins, Tylenol  [acetaminophen ], and Zestril [lisinopril]    Review of Systems   Review of Systems  Physical Exam Updated Vital Signs BP 127/86   Pulse 78   Temp 98.3 F (36.8 C) (Oral)   Resp 15   Ht 5\' 4"  (1.626 m)   Wt 77.1 kg   SpO2 98%   BMI 29.18 kg/m  Physical Exam Vitals and nursing note reviewed.  Constitutional:      General: She is not in acute distress.    Appearance: She is well-developed. She is not ill-appearing.  HENT:     Head: Normocephalic and atraumatic.     Mouth/Throat:     Mouth: Mucous membranes are moist.  Eyes:     Extraocular Movements: Extraocular movements intact.     Conjunctiva/sclera: Conjunctivae normal.     Pupils: Pupils are equal, round, and reactive to light.  Cardiovascular:     Rate and Rhythm: Normal rate and regular rhythm.     Pulses: Normal pulses.     Heart sounds: Normal heart sounds. No murmur heard. Pulmonary:     Effort: Pulmonary effort is normal.  No respiratory distress.     Breath sounds: Normal breath sounds.  Abdominal:     Palpations: Abdomen is soft.     Tenderness: There is no abdominal tenderness.  Musculoskeletal:        General: No swelling.     Cervical back: Normal range of motion and neck supple.  Skin:    General: Skin is warm and dry.     Capillary Refill: Capillary refill takes less than 2 seconds.  Neurological:     General: No focal deficit present.     Mental Status: She is alert and oriented to person, place, and time.     Cranial Nerves: No cranial nerve deficit.     Sensory: No sensory deficit.     Motor: No weakness.     Coordination: Coordination normal.     Comments: 5+ out of 5 strength, normal sensation, no drift, normal finger-nose-finger, normal  speech  Psychiatric:     Comments: Endorses SI     ED Results / Procedures / Treatments   Labs (all labs ordered are listed, but only abnormal results are displayed) Labs Reviewed  COMPREHENSIVE METABOLIC PANEL WITH GFR  ETHANOL  RAPID URINE DRUG SCREEN, HOSP PERFORMED  CBC WITH DIFFERENTIAL/PLATELET  SALICYLATE LEVEL  ACETAMINOPHEN  LEVEL    EKG None  Radiology No results found.  Procedures Procedures    Medications Ordered in ED Medications - No data to display  ED Course/ Medical Decision Making/ A&P                                 Medical Decision Making Amount and/or Complexity of Data Reviewed Labs: ordered.   Alisha Webster is here after overdose on Abilify  and BuSpar .  I had just discharged her earlier this evening after she had already been discharged from inpatient psychiatry after 6-day stay.  They discharged her from malingering and not really participating in treatment after given multiple options for substance abuse treatment and housing options.  She had unremarkable workup here and after I talked with her about housing and resources she was amenable to discharge.  I charged her phone we had fetor.  Overall she did not really endorse SI to me but kind of threatened it at times when we talked about how we could not provide her shelter.  Ultimately she comes in here stating that she has taken Abilify  and BuSpar .  Unknown amounts.  She has multiple pill bottles with her and when I look at them most of them are expired.  There is an empty BuSpar  and Abilify  bottle.  The BuSpar  prescription is from 2 months ago.  The Abilify  prescription is from a month ago.  There is a tablet of oxycodone  that she states was there and she has multiple other pill bottles that are partially filled.  Ultimately she is asymptomatic.  Will talk with poison control.  Will do medical clearance and have her evaluated by psychiatry again.  Poison control recommends observation for about 10  hours repeat EKG overnight.  Will be medically cleared in the morning if things remain stable she could be evaluated by psychiatry again in the morning.  EKG showed sinus rhythm.  No changes from prior.  I reviewed interpreted EKG.  Will handoff to oncoming ED staff, with medical clearance workup pending.  This chart was dictated using voice recognition software.  Despite best efforts to proofread,  errors can occur which can  change the documentation meaning.         Final Clinical Impression(s) / ED Diagnoses Final diagnoses:  Suicidal ideation  Intentional overdose, initial encounter [facility])    Rx / DC Orders ED Discharge Orders     None         Lowery Rue, DO 07/16/23 2257

## 2023-07-16 NOTE — ED Notes (Signed)
 Lunch offered, patient denied.

## 2023-07-16 NOTE — ED Triage Notes (Signed)
 Patient has had decreased PO intake for a while with concerns of dehydration. She also reports nausea, vomiting, dizziness and lightheadedness for the last few hours. Patient has also experienced SI for a while.    EMS vitals: 92/68 initial BP 102/62 final BP 100-88 P 20 RR 96% SPO2 on room air 135 CBG

## 2023-07-16 NOTE — ED Notes (Signed)
 Patient A&Ox4. Has been calm, cooperative, and appropriate with peers. She denies intent to harm self/others. Denies A/VH. Patient endorses pain in her head, knees, and back. (Please see MAR). Patient is malodorous. Patient has been encouraged to shower multiple times. No acute distress observed. Routine safety checks conducted according to facility protocol. Patient agreed to notify staff should thoughts of harm toward self or others arise. We will continue to monitor for safety.

## 2023-07-16 NOTE — ED Notes (Addendum)
 Patient was offered breakfast but has not got up to eat.

## 2023-07-16 NOTE — ED Provider Notes (Signed)
 [facility]BS ASAP Discharge Summary  Date and Time: 07/16/2023 10:50 AM  Name: Alisha Webster  MRN:  [id number]   Discharge Diagnoses:  Final diagnoses:  MDD (major depressive disorder), recurrent severe, without psychosis (HCC)  Substance induced mood disorder (HCC)  Acquired hypothyroidism  Methamphetamine use disorder, severe (HCC)  Homelessness  Chronic pain syndrome   Eara Burruel is a 53 y.o. female with a past psychiatric history of MDD, GAD, and methamphetamine use disorder and past medical history of Sjogren's syndrome, hypothyroidism, and hypertension who presented to [facility] from voluntary walk-in at [facility] for suicidal ideation with plan to overdose.   Subjective:   Patient was seen on the unit today and states she has not showered because she is dizzy. We discussed that there is a Higher education careers adviser in the shower that she can utilize and she says "okay if I fall in there I fall". She also states "I am going straight to the ER after here because my kidneys are failing". Of note, her GFR is >60 on 4/13. She says "I am always suicidal". Patient's inpatient psychiatric hospitalization is no longer beneficial and has exhausted its therapeutic value. Risks of continued hospitalization now include delayed access to outpatient resources, disrupted continuity of care, increased risk of institutionalization, and potential exacerbation of symptoms due to lack of real-world engagement. Transition to outpatient care is essential for ongoing treatment and recovery.   Review of Systems  Constitutional:  Negative for fever.  Cardiovascular:  Negative for chest pain.  Gastrointestinal:  Positive for nausea.  Neurological:  Positive for dizziness.  Psychiatric/Behavioral:  Positive for substance abuse. Negative for hallucinations.      Stay Summary:  Admission date: 07/10/2023 Discharge date: 07/16/2023   Total duration of encounter: 6 days  The patient was evaluated each day by a clinical provider  to ascertain response to treatment. Improvement was noted by the patient's report of decreasing symptoms, improved sleep and appetite, affect, medication tolerance, behavior, and participation in unit programming.  Patient was asked each day to complete a self inventory noting mood, mental status, pain, new symptoms, anxiety and concerns.  The patient's medications were managed with the following directions: - Continue buspirone  10 mg twice daily for anxiety - Continue Effexor  150 mg daily - Continue Abilify  10 mg daily              TSH on 05/10/2023: WNL, A1c on 05/10/2023: 6.0, lipid panel on 07/14/2023: total chol 226, triglycerides 141, LDL 149   Hypothyroidism - Continue home Synthroid  100 mcg   GERD - Continue home Protonix  40 mg daily   Hypertension - Continue HCTZ 25 mg daily  Patient minimally participated in her treatment as evidenced to not going to any group sessions and staying in her room, minimally eating and drinking. She has successfully completed her detox from meth and her persistent statements are suicidal ideation seem to be closely tied with lack of housing as she makes statements like "as long as I'm homeless, I want to kill myself".   Total Time spent with patient: 1 hour  Past Psychiatric History: see H&P Past Medical History: see H&P Family History: see H&P Family Psychiatric History: see H&P Social History: see H&P Tobacco Cessation:  N/A, patient does not currently use tobacco products  Current Medications:  Current Facility-Administered Medications  Medication Dose Route Frequency Provider Last Rate Last Admin   ARIPiprazole  (ABILIFY ) tablet 10 mg  10 mg Oral Daily Demetri Goshert B, MD   10 mg at 07/16/23  1610   busPIRone  (BUSPAR ) tablet 10 mg  10 mg Oral BID Ji, Andrew, MD   10 mg at 07/16/23 9604   dicyclomine  (BENTYL ) tablet 20 mg  20 mg Oral Q6H PRN Augusta Blizzard, MD       haloperidol  (HALDOL ) tablet 5 mg  5 mg Oral TID PRN Levester Reagin, NP        And   diphenhydrAMINE  (BENADRYL ) capsule 50 mg  50 mg Oral TID PRN Lewis, Tanika N, NP       haloperidol  lactate (HALDOL ) injection 5 mg  5 mg Intramuscular TID PRN Levester Reagin, NP       And   diphenhydrAMINE  (BENADRYL ) injection 50 mg  50 mg Intramuscular TID PRN Levester Reagin, NP       And   LORazepam  (ATIVAN ) injection 2 mg  2 mg Intramuscular TID PRN Lewis, Tanika N, NP       haloperidol  lactate (HALDOL ) injection 10 mg  10 mg Intramuscular TID PRN Levester Reagin, NP       And   diphenhydrAMINE  (BENADRYL ) injection 50 mg  50 mg Intramuscular TID PRN Levester Reagin, NP       And   LORazepam  (ATIVAN ) injection 2 mg  2 mg Intramuscular TID PRN Levester Reagin, NP       feeding supplement (ENSURE ENLIVE / ENSURE PLUS) liquid 237 mL  237 mL Oral BID BM Tarique Loveall B, MD   237 mL at 07/16/23 5409   hydrochlorothiazide  (HYDRODIURIL ) tablet 25 mg  25 mg Oral Daily Ji, Andrew, MD   25 mg at 07/16/23 8119   hydrOXYzine  (ATARAX ) tablet 25 mg  25 mg Oral Q6H PRN Augusta Blizzard, MD       ibuprofen  (ADVIL ) tablet 200 mg  200 mg Oral Q6H PRN Augusta Blizzard, MD   200 mg at 07/16/23 1478   levothyroxine  (SYNTHROID ) tablet 100 mcg  100 mcg Oral Daily Augusta Blizzard, MD   100 mcg at 07/16/23 2956   loperamide  (IMODIUM ) capsule 2-4 mg  2-4 mg Oral PRN Augusta Blizzard, MD       methocarbamol  (ROBAXIN ) tablet 500 mg  500 mg Oral Daily PRN Augusta Blizzard, MD   500 mg at 07/13/23 2127   naproxen  (NAPROSYN ) tablet 500 mg  500 mg Oral BID PRN Augusta Blizzard, MD   500 mg at 07/13/23 2129   ondansetron  (ZOFRAN -ODT) disintegrating tablet 4 mg  4 mg Oral Q6H PRN Augusta Blizzard, MD   4 mg at 07/16/23 2130   pantoprazole  (PROTONIX ) EC tablet 40 mg  40 mg Oral Daily Ji, Andrew, MD   40 mg at 07/16/23 8657   venlafaxine  XR (EFFEXOR -XR) 24 hr capsule 150 mg  150 mg Oral Daily Ji, Andrew, MD   150 mg at 07/16/23 8469   Current Outpatient Medications  Medication Sig Dispense Refill   [START ON 07/17/2023] ARIPiprazole  (ABILIFY ) 10 MG tablet  Take 1 tablet (10 mg total) by mouth daily. 30 tablet 0   busPIRone  (BUSPAR ) 10 MG tablet Take 1 tablet (10 mg total) by mouth 2 (two) times daily. 60 tablet 0   hydrochlorothiazide  (HYDRODIURIL ) 25 MG tablet Take 25 mg by mouth daily.     levothyroxine  (SYNTHROID ) 88 MCG tablet Take 88 mcg by mouth daily.     omeprazole (PRILOSEC) 20 MG capsule Take 20 mg by mouth daily.     venlafaxine  XR (EFFEXOR -XR) 150 MG 24 hr capsule Take 1 capsule (150 mg total) by mouth  daily. 30 capsule 0   PTA Medications:  Facility Ordered Medications  Medication   [COMPLETED] potassium chloride  SA (KLOR-CON  M) CR tablet 40 mEq   haloperidol  (HALDOL ) tablet 5 mg   And   diphenhydrAMINE  (BENADRYL ) capsule 50 mg   haloperidol  lactate (HALDOL ) injection 5 mg   And   diphenhydrAMINE  (BENADRYL ) injection 50 mg   And   LORazepam  (ATIVAN ) injection 2 mg   haloperidol  lactate (HALDOL ) injection 10 mg   And   diphenhydrAMINE  (BENADRYL ) injection 50 mg   And   LORazepam  (ATIVAN ) injection 2 mg   busPIRone  (BUSPAR ) tablet 10 mg   hydrochlorothiazide  (HYDRODIURIL ) tablet 25 mg   methocarbamol  (ROBAXIN ) tablet 500 mg   pantoprazole  (PROTONIX ) EC tablet 40 mg   levothyroxine  (SYNTHROID ) tablet 100 mcg   ibuprofen  (ADVIL ) tablet 200 mg   dicyclomine  (BENTYL ) tablet 20 mg   hydrOXYzine  (ATARAX ) tablet 25 mg   loperamide  (IMODIUM ) capsule 2-4 mg   naproxen  (NAPROSYN ) tablet 500 mg   ondansetron  (ZOFRAN -ODT) disintegrating tablet 4 mg   venlafaxine  XR (EFFEXOR -XR) 24 hr capsule 150 mg   feeding supplement (ENSURE ENLIVE / ENSURE PLUS) liquid 237 mL   ARIPiprazole  (ABILIFY ) tablet 10 mg   PTA Medications  Medication Sig   hydrochlorothiazide  (HYDRODIURIL ) 25 MG tablet Take 25 mg by mouth daily.   levothyroxine  (SYNTHROID ) 88 MCG tablet Take 88 mcg by mouth daily.   omeprazole (PRILOSEC) 20 MG capsule Take 20 mg by mouth daily.   [START ON 07/17/2023] ARIPiprazole  (ABILIFY ) 10 MG tablet Take 1 tablet (10 mg total)  by mouth daily.   busPIRone  (BUSPAR ) 10 MG tablet Take 1 tablet (10 mg total) by mouth 2 (two) times daily.   venlafaxine  XR (EFFEXOR -XR) 150 MG 24 hr capsule Take 1 capsule (150 mg total) by mouth daily.      07/11/2023    1:45 PM  Depression screen PHQ 2/9  Decreased Interest 3  Down, Depressed, Hopeless 3  PHQ - 2 Score 6   Flowsheet Row ED from 07/10/2023 in [facility] Most recent reading at 07/10/2023  7:11 PM ED from 07/10/2023 in [facility] Emergency Department at [facility] Most recent reading at 07/10/2023 11:38 AM ED from 04/05/2023 in [facility] Emergency Department at [facility] Most recent reading at 04/05/2023  3:35 AM  C-SSRS RISK CATEGORY High Risk High Risk No Risk      Musculoskeletal  Strength & Muscle Tone: within normal limits Gait & Station: normal Patient leans: N/A  Strength & Muscle Tone: within normal limits Gait & Station: normal Patient leans: N/A  Psychiatric Specialty Exam  Presentation General Appearance:Appropriate for Environment, Disheveled Eye Contact:Fair Speech:Clear and Coherent Volume:Normal Handedness:Right  Mood and Affect  Mood:Irritable Affect:Congruent, Inappropriate  Thought Process  Thought Process:Linear, Goal Directed Descriptions of Associations:Intact  Thought Content Suicidal Thoughts:Yes, Active Homicidal Thoughts:No Hallucinations:None Ideas of Reference:None Thought Content:Logical  Sensorium  Memory:Remote Good Judgment:Poor Insight:Poor  Executive Functions  Orientation:Full (Time, Place and Person) Language:Fair Concentration:Poor Attention:Poor Recall:Fair Fund of Knowledge:Fair  Psychomotor Activity  Psychomotor Activity:Psychomotor Activity: Normal  Assets  Assets:Resilience  Sleep  Quality:Fair  Physical Exam  Physical Exam Vitals reviewed.  Constitutional:      Appearance: Normal appearance.  HENT:     Head: Normocephalic and  atraumatic.  Cardiovascular:     Rate and Rhythm: Normal rate.  Pulmonary:     Effort: Pulmonary effort is normal.  Neurological:     General: No focal deficit present.  Mental Status: She is alert and oriented to person, place, and time.    Blood pressure (!) 135/101, pulse 76, temperature 97.9 F (36.6 C), temperature source Oral, resp. rate 20, SpO2 99%. There is no height or weight on file to calculate BMI.  Demographic Factors:  Caucasian, Low socioeconomic status, Living alone, and Unemployed  Loss Factors: Decline in physical health and Financial problems/change in socioeconomic status  Historical Factors: Impulsivity  Risk Reduction Factors:   NA  Continued Clinical Symptoms:  Severe Anxiety and/or Agitation Alcohol/Substance Abuse/Dependencies  Cognitive Features That Contribute To Risk:  Closed-mindedness    Suicide Risk:  Mild:  Suicidal ideation of limited frequency, intensity, duration, and specificity.  Closely tied with socioeconomic factors like substance use with poor judgement expressing plan of continued use and unstable housing.  Plan Of Care/Follow-up recommendations:  Activity as tolerated. Diet as recommended by PCP. Keep all scheduled follow-up appointments as recommended.  Patient is instructed to take all prescribed medications as recommended. Report any side effects or adverse reactions to your outpatient psychiatrist. Patient is instructed to abstain from alcohol and illegal drugs while on prescription medications. In the event of worsening symptoms, patient is instructed to call the crisis hotline, 911, or go to the nearest emergency department for evaluation and treatment.  Prescriptions given at discharge. Patient agreeable to plan. Given opportunity to ask questions. Appears to feel comfortable with discharge.  Patient is also instructed prior to discharge to: Take all medications as prescribed by mental healthcare provider. Report any  adverse effects and or reactions from the medicines to outpatient provider promptly. Patient has been instructed & cautioned: To not engage in alcohol and or illegal drug use while on prescription medicines. In the event of worsening symptoms,  patient is instructed to call the crisis hotline, 911 and or go to the nearest ED for appropriate evaluation and treatment of symptoms. To follow-up with primary care provider for other medical issues, concerns and or health care needs  The patient was evaluated each day by a clinical provider to ascertain response to treatment. Improvement was noted by the patient's report of decreasing symptoms, improved sleep and appetite, affect, medication tolerance, behavior, and participation in unit programming.  Patient was asked each day to complete a self inventory noting mood, mental status, pain, new symptoms, anxiety and concerns.  Patient responded well to medication and being in a therapeutic and supportive environment. Positive and appropriate behavior was noted and the patient was motivated for recovery. The patient worked closely with the treatment team and case manager to develop a discharge plan with appropriate goals. Coping skills, problem solving as well as relaxation therapies were also part of the unit programming.  By the day of discharge patient was in much improved condition than upon admission.  Symptoms were reported as significantly decreased or resolved completely. The patient was motivated to continue taking medication with a goal of continued improvement in mental health.    Disposition:  Shelter  Joice Nares, MD Psych Resident, PGY-2

## 2023-07-16 NOTE — ED Notes (Signed)
 Poison control contacted # [phone]. Contact person [place]. Per poison control, 10 hour medical observation with a repeat EKG at 0215. Provider aware

## 2023-07-16 NOTE — ED Notes (Signed)
 2 bags of patient belongings were placed behind the 23-25 nurses station.

## 2023-07-16 NOTE — Discharge Instructions (Signed)

## 2023-07-16 NOTE — ED Notes (Signed)
 Patient refused final set of vitals and education with AVS.

## 2023-07-16 NOTE — ED Notes (Signed)
Patient observed resting quietly, eyes closed. Respirations equal and unlabored. Will continue to monitor for safety.  

## 2023-07-16 NOTE — ED Provider Notes (Signed)
  Physical Exam  BP 127/86   Pulse 78   Temp 98.3 F (36.8 C) (Oral)   Resp 15   Ht 5\' 4"  (1.626 m)   Wt 77.1 kg   SpO2 98%   BMI 29.18 kg/m   Physical Exam  Procedures  Procedures  ED Course / MDM    Medical Decision Making Amount and/or Complexity of Data Reviewed Labs: ordered.  Risk Prescription drug management.   63F, presenting with SI, seen earlier and discharged, recent inpatient psychiatric admission. Was just discharged by psychiatry this morning. States that she overdosed on her Buspar  and Abilify  in the parking lot today. Needs ten hour observation per poison control. Medically cleared as of 0800. Pt observed in the ER without complication, signout given to Dr. Reba Camper at 0700 pending medical clearance for TTS consult.       Rosealee Concha, MD 07/17/23 [id number]

## 2023-07-16 NOTE — ED Notes (Signed)
 Patient

## 2023-07-16 NOTE — ED Provider Notes (Signed)
 [facility] AT [facility] Provider Note   CSN: [id number] Arrival date & time: 07/16/23  1323     History  Chief Complaint  Patient presents with   Suicidal   Nausea   Emesis    Alisha Webster is a 53 y.o. female.  HPI 53 year old female presents with concern for dehydration.  She states she has not been eating and drinking well for the last 3 days and has only urinated once in 3 days.  She states she feels nauseated but also just no appetite.  She denies any abdominal pain.  This morning she woke up and has had a frontal headache that has gradually worsened and is about a 6 out of 10.  No focal weakness or numbness.  No chest pain, shortness of breath.  She feels lightheaded when she stands.  Also endorses suicidal thoughts for the last couple weeks.  Before coming in here she contemplated walking into traffic.  Home Medications Prior to Admission medications   Medication Sig Start Date End Date Taking? Authorizing Provider  ARIPiprazole  (ABILIFY ) 5 MG tablet Take 5 mg by mouth daily.   Yes [provider]  busPIRone  (BUSPAR ) 10 MG tablet Take 1 tablet (10 mg total) by mouth 2 (two) times daily. 07/16/23  Yes Izella Ismael NOVAK, MD  gabapentin  (NEURONTIN ) 600 MG tablet Take 600 mg by mouth 3 (three) times daily.   Yes [provider]  hydrochlorothiazide  (HYDRODIURIL ) 25 MG tablet Take 25 mg by mouth daily.   Yes [provider]  ibuprofen  (ADVIL ) 200 MG tablet Take 400-800 mg by mouth 2 (two) times daily as needed for mild pain (pain score 1-3) or moderate pain (pain score 4-6).   Yes [provider]  levothyroxine  (SYNTHROID ) 100 MCG tablet Take 100 mcg by mouth daily before breakfast.   Yes [provider]  magnesium  oxide (MAG-OX) 400 (240 Mg) MG tablet Take 400 mg by mouth daily.   Yes [provider]  omeprazole (PRILOSEC) 20 MG capsule Take 20 mg by mouth daily. 09/03/22 09/04/23 Yes [provider]  venlafaxine  XR (EFFEXOR -XR) 150 MG 24 hr capsule Take 1 capsule (150 mg total) by mouth daily. 07/16/23  Yes Izella Ismael NOVAK, MD  ARIPiprazole  (ABILIFY ) 10 MG tablet Take 1 tablet (10 mg total) by mouth daily. Patient not taking: Reported on 07/16/2023 07/17/23   Izella Ismael NOVAK, MD      Allergies    Deltasone [prednisone], Lopid [gemfibrozil], Norvasc  [amlodipine ], Reglan  [metoclopramide ], Statins, Tylenol  [acetaminophen ], and Zestril [lisinopril]    Review of Systems   Review of Systems  Constitutional:  Negative for fever.  Respiratory:  Negative for shortness of breath.   Cardiovascular:  Negative for chest pain.  Gastrointestinal:  Positive for nausea. Negative for abdominal pain and vomiting.  Genitourinary:  Positive for decreased urine volume. Negative for dysuria.  Neurological:  Positive for light-headedness and headaches.  Psychiatric/Behavioral:  Positive for suicidal ideas.     Physical Exam Updated Vital Signs BP 125/76 (BP Location: Right Arm)   Pulse 91   Temp 99 F (37.2 C) (Oral)   Resp 16   SpO2 97%  Physical Exam Vitals and nursing note reviewed.  Constitutional:      Appearance: She is well-developed.  HENT:     Head: Normocephalic and atraumatic.  Eyes:     Extraocular Movements: Extraocular movements intact.  Cardiovascular:     Rate and Rhythm: Normal rate and regular rhythm.  Heart sounds: Normal heart sounds.  Pulmonary:     Effort: Pulmonary effort is normal.     Breath sounds: Normal breath sounds.  Abdominal:     Palpations: Abdomen is soft.     Tenderness: There is no abdominal tenderness.  Skin:    General: Skin is warm and dry.  Neurological:     Mental Status: She is alert.     Comments: CN 3-12 grossly intact. 5/5 strength in all 4 extremities. Grossly normal sensation. Normal finger to nose.  Normal gait.  Psychiatric:        Thought Content: Thought content includes suicidal ideation.     ED Results /  Procedures / Treatments   Labs (all labs ordered are listed, but only abnormal results are displayed) Labs Reviewed  COMPREHENSIVE METABOLIC PANEL WITH GFR - Abnormal; Notable for the following components:      Result Value   BUN 33 (*)    AST 50 (*)    All other components within normal limits  URINALYSIS, ROUTINE W REFLEX MICROSCOPIC - Abnormal; Notable for the following components:   APPearance CLOUDY (*)    Protein, ur 30 (*)    Leukocytes,Ua TRACE (*)    Bacteria, UA RARE (*)    All other components within normal limits  ACETAMINOPHEN  LEVEL - Abnormal; Notable for the following components:   Acetaminophen  (Tylenol ), Serum <10 (*)    All other components within normal limits  SALICYLATE LEVEL - Abnormal; Notable for the following components:   Salicylate Lvl <7.0 (*)    All other components within normal limits  RAPID URINE DRUG SCREEN, HOSP PERFORMED  ETHANOL  CBC WITH DIFFERENTIAL/PLATELET  MAGNESIUM   CBC WITH DIFFERENTIAL/PLATELET    EKG EKG Interpretation Date/Time:  Friday July 16 2023 14:59:56 EDT Ventricular Rate:  82 PR Interval:  154 QRS Duration:  108 QT Interval:  364 QTC Calculation: 426 R Axis:   54  Text Interpretation: Sinus rhythm  nonspecific T waves Confirmed by Freddi Hamilton [phone]) on 07/16/2023 3:31:24 PM  Radiology No results found.  Procedures Procedures    Medications Ordered in ED Medications  lactated ringers  bolus 1,000 mL (1,000 mLs Intravenous New Bag/Given 07/16/23 1542)  acetaminophen  (TYLENOL ) tablet 650 mg (650 mg Oral Given 07/16/23 1542)    ED Course/ Medical Decision Making/ A&P                                 Medical Decision Making Amount and/or Complexity of Data Reviewed Labs: ordered.    Details: Normal Creatinine ECG/medicine tests: ordered and independent interpretation performed.    Details: Nonspecific T waves  Risk OTC drugs.   Patient presents with reports of dehydration but her vitals are normal,  creatinine is normal, and urine shows no ketones.  She was given a bolus of fluids.  She also has a nonspecific EKG though no chest pain.  Will send troponins and currently her hemoglobin is still pending.  If medically cleared she will need psychiatric consultation as she is complaining of suicidal thoughts with a plan to run in traffic.  Care transferred to Dr. Ruthe.        Final Clinical Impression(s) / ED Diagnoses Final diagnoses:  None    Rx / DC Orders ED Discharge Orders     None         Freddi Hamilton, MD 07/16/23 1630

## 2023-07-17 DIAGNOSIS — F332 Major depressive disorder, recurrent severe without psychotic features: Secondary | ICD-10-CM

## 2023-07-17 DIAGNOSIS — T438X2A Poisoning by other psychotropic drugs, intentional self-harm, initial encounter: Secondary | ICD-10-CM | POA: Diagnosis present

## 2023-07-17 LAB — RAPID URINE DRUG SCREEN, HOSP PERFORMED
Amphetamines: NOT DETECTED
Barbiturates: NOT DETECTED
Benzodiazepines: NOT DETECTED
Cocaine: NOT DETECTED
Opiates: NOT DETECTED
Tetrahydrocannabinol: NOT DETECTED

## 2023-07-17 LAB — SARS CORONAVIRUS 2 BY RT PCR: SARS Coronavirus 2 by RT PCR: NEGATIVE

## 2023-07-17 LAB — MAGNESIUM: Magnesium: 1.9 mg/dL (ref 1.7–2.4)

## 2023-07-17 LAB — PREGNANCY, URINE: Preg Test, Ur: NEGATIVE

## 2023-07-17 MED ORDER — POTASSIUM CHLORIDE CRYS ER 20 MEQ PO TBCR
40.0000 meq | EXTENDED_RELEASE_TABLET | Freq: Once | ORAL | Status: AC
  Administered 2023-07-17: 40 meq via ORAL
  Filled 2023-07-17 (×2): qty 2

## 2023-07-17 MED ORDER — MAGNESIUM SULFATE 2 GM/50ML IV SOLN
2.0000 g | Freq: Once | INTRAVENOUS | Status: AC
  Administered 2023-07-17: 2 g via INTRAVENOUS
  Filled 2023-07-17: qty 50

## 2023-07-17 NOTE — ED Notes (Signed)
 Patient dressed put and belongings placed in cabinet behind nurses station, labeled [address]. Patient has 3 bags, (2 personal, 1 belongings bag) and the belongings bag contains a gray shirt, black pants and black shoes.

## 2023-07-17 NOTE — ED Provider Notes (Addendum)
 Patient medically clear for TTS evaluation.   IVC initiated at request of psych team.   Burnette Carte, MD 07/17/23 9604    Burnette Carte, MD 07/17/23 1420

## 2023-07-17 NOTE — ED Notes (Signed)
 Patient dressed put and belongings placed in [address].  Patient has 3 bags, (2 personal, 1 belongings bag) and the belongings bag contains a gray shirt, black pants and black shoes.

## 2023-07-17 NOTE — Consult Note (Signed)
 [facility] Psychiatric Consult Initial  Patient Name: .Alisha Webster  MRN: [id number]  DOB: [birth year]  Consult Order details:  Orders (From admission, onward)     Start     Ordered   07/17/23 0814  CONSULT TO CALL ACT TEAM       Ordering Provider: Burnette Carte, MD  Provider:  (Not yet assigned)  Question:  Reason for Consult?  Answer:  Psych consult   07/17/23 0813   07/17/23 7829  CONSULT TO CALL ACT TEAM       Ordering Provider: Rosealee Concha, MD  Provider:  (Not yet assigned)  Question:  Reason for Consult?  Answer:  Overdose   07/17/23 0606             Mode of Visit: In person    Psychiatry Consult Evaluation  Service Date: July 17, 2023 LOS:  LOS: 0 days  Chief Complaint Suicide attempt by ingesting Psychotropic Medications, Depression  Primary Psychiatric Diagnoses  Recurrent Major Depressive disorder, severe without Psychotic features 2.  Suicide attempt 3.Borderline personality Disorder Assessment  Alisha Webster is a 53 y.o. female admitted: Presented to the EDfor 07/16/2023  9:58 PM for Suicide attempt by ingesting Psychotropic Medications, Depression. She carries the psychiatric diagnoses of major depressive disorder, generalized anxiety disorder substance-induced mood disorder and has a past medical history of hypertension, thyroid disorder.  and has a past medical history of.   Her current presentation of suicide attempt and severe depression is  is most consistent with not taking her Psychotropic medications. She meets criteria for inpatient Psychiatry hospitalization based on her her taking unknown amount of Abilify  and Buspirone  and having a hx of previous OD on Buspirone .  Current outpatient psychotropic medications include Abilify , Effexor  and Buspirone  and historically she has had a unknown response to these medications. She was not compliant with medications prior to admission as evidenced by her reports and use of Methamphetamine to self Medicate. On  initial examination, patient was tearful and insisted that if discharged with care she will kill herself with more Medications. Please see plan below for detailed recommendations.   Diagnoses:  Active Hospital problems: Principal Problem:   Suicide attempt by other psychotropic drug overdose [facility]) Active Problems:   MDD (major depressive disorder), recurrent severe, without psychosis (HCC)    Plan   ## Psychiatric Medication Recommendations:  Hold Medications as patient remains drowsy at this time  ## Medical Decision Making Capacity: Not specifically addressed in this encounter  ## Further Work-up:  -- most recent EKG on 07/17/2023 had QtC of 448 -- Pertinent labwork reviewed earlier this admission includes: UDS, CMP, CBC   ## Disposition:-- We recommend inpatient psychiatric hospitalization when medically cleared. Patient is under voluntary admission status at this time; please IVC if attempts to leave hospital.  ## Behavioral / Environmental: - No specific recommendations at this time.     ## Safety and Observation Level:  - Based on my clinical evaluation, I estimate the patient to be at Moderate risk of self harm in the current setting. - At this time, we recommend  routine. This decision is based on my review of the chart including patient's history and current presentation, interview of the patient, mental status examination, and consideration of suicide risk including evaluating suicidal ideation, plan, intent, suicidal or self-harm behaviors, risk factors, and protective factors. This judgment is based on our ability to directly address suicide risk, implement suicide prevention strategies, and develop a safety plan while the patient is in  the clinical setting. Please contact our team if there is a concern that risk level has changed.  CSSR Risk Category:C-SSRS RISK CATEGORY: High Risk  Suicide Risk Assessment: Patient has following modifiable risk factors for suicide:  active suicidal ideation, under treated depression , recklessness, and medication noncompliance, which we are addressing by recommending inpatient Psychiatry hospitalization. Patient has following non-modifiable or demographic risk factors for suicide: history of suicide attempt and psychiatric hospitalization Patient has the following protective factors against suicide: Access to outpatient mental health care  Thank you for this consult request. Recommendations have been communicated to the primary team.  We will continue to round on patient until she secures a bed. at this time.   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  Patient left [facility], in the parking lot took unknown amount of Abilify  and Buspirone .  Patient called EMS and came back to the ER.  She has been under close monitoring per Poison Control Protocol.  Patient with known hx of major depressive disorder, generalized anxiety disorder, borderline personality disorder and sees a Psychiatric NP at [facility].  Patient states her medications have not been effective and added she uses Methamphetamine at times to treat herself.  She has had three previous inpatient Psychiatry hospitalization and last was early this week.  Patient is homeless, states she has no friend or family.  Patient admitted taking the Medications yesterday to kill herself to prevent her being a burden to the system.  Patient is threatening that if let go without care she will OD again and come into the hospital.  Patient is tearful, disheveled and unkempt.  Patient reports feeling hopeless, worthless and helpless. Based on the fact that patient is still feeling suicidal with a plan to OD again,  having previous suicide attempt by OD recently we will seek inpatient Psychiatry hospitalization at any facility with available bed.   Psych ROS:  Depression: yes Anxiety:  yes Mania (lifetime and current): na Psychosis: (lifetime and current): na  Collateral information:  Contacted -No contact   Review of Systems  All other systems reviewed and are negative.    Psychiatric and Social History  Psychiatric History:  Information collected from patient Prev Dx/Sx: Major depressive disorder, generalized anxiety disorder Current Psych Provider: [facility], [facility] Meds (current): Effexor , BuSpar , gabapentin  and hydroxyzine  Previous Med Trials: Unable to recall at this time but states she has tried multiple medications in the past Therapy: Denied   Prior Psych Hospitalization: Reports 2 months ago plus another this week for detox from Amphetamine. Prior Self Harm:  yes, Overdose Prior Violence: Denied   Family Psych History: Unknown documented family history Family Hx suicide: Denied   Social History:  Developmental Hx: n/A Educational Hx: n/a Occupational Hx: Seeking disability Legal Hx: Denied Living Situation: Homelessness Spiritual Hx: n/a Access to weapons/lethal means: Denied   Substance History Alcohol: denied  Type of alcohol  Last Drink  Number of drinks per day  History of alcohol withdrawal seizures documented history related to seizure disorder History of DT's  Tobacco:  Illicit drugs: Methamphetamines and cocaine Prescription drug abuse: Documented history related to drug-seeking behaviors Rehab hx: N/a  Exam Findings  Physical  Exam:  Vital Signs:  Temp:  [97.9 F (36.6 C)-99 F (37.2 C)] 97.9 F (36.6 C) (04/19 1093) Pulse Rate:  [72-91] 72 (04/19 0638) Resp:  [14-16] 16 (04/19 0638) BP: (106-153)/(64-91) 153/91 (04/19 2355) SpO2:  [97 %-99 %] 99 % (04/19 7322) Weight:  [77.1 kg] 77.1 kg (04/18 2203) Blood  pressure (!) 153/91, pulse 72, temperature 97.9 F (36.6 C), temperature source Oral, resp. rate 16, height 5\' 4"  (1.626 m), weight 77.1 kg, SpO2 99%. Body mass index is 29.18 kg/m.  Physical Exam Vitals and nursing note reviewed.  Constitutional:      Appearance: She is obese.  HENT:     Nose: Nose normal.  Cardiovascular:     Rate and Rhythm: Normal rate and regular rhythm.  Pulmonary:     Effort: Pulmonary effort is normal.  Musculoskeletal:        General: Normal range of motion.  Skin:    General: Skin is dry.  Neurological:     Mental Status: She is alert and oriented to person, place, and time.  Psychiatric:        Attention and Perception: Attention normal.        Mood and Affect: Mood is depressed. Affect is angry and tearful.        Speech: Speech normal.        Behavior: Behavior normal. Behavior is cooperative.        Thought Content: Thought content includes suicidal ideation. Thought content includes suicidal plan.        Cognition and Memory: Cognition and memory normal.        Judgment: Judgment is impulsive and inappropriate.     Mental Status Exam: General Appearance: Disheveled  Orientation:  Full (Time, Place, and Person)  Memory:  Immediate;   Good Recent;   Good Remote;   Good  Concentration:  Concentration: Good and Attention Span: Good  Recall:  Good  Attention  Good  Eye Contact:  Minimal  Speech:  Clear and Coherent  Language:  Good  Volume:  Decreased  Mood: "I  am depressed, I cannot take it any more"  Affect:  Congruent  Thought Process:  Coherent  Thought Content:  Logical  Suicidal Thoughts:  Yes.  with intent/plan  Homicidal Thoughts:  No  Judgement:  Impaired  Insight:  Fair  Psychomotor Activity:  Decreased  Akathisia:  NA  Fund of Knowledge:  Fair      Assets:  Manufacturing systems engineer  Cognition:  Impaired,  Mild  ADL's:  Impaired  AIMS (if indicated):        Other History   These have been pulled in through the EMR,  reviewed, and updated if appropriate.  Family History:  The patient's family history is not on file.  Medical History: Past Medical History:  Diagnosis Date   Anxiety    Back pain    Depression    Hyperlipidemia    Hypertension     Surgical History: Past Surgical History:  Procedure Laterality Date   CHOLECYSTECTOMY     for gallstones   Tonisillectomy     TOTAL ABDOMINAL HYSTERECTOMY     One ovary left     Medications:  No current facility-administered medications for this encounter.  Current Outpatient Medications:    ARIPiprazole  (ABILIFY ) 10 MG tablet, Take 1 tablet (10 mg total) by mouth daily. (Patient not taking: Reported on 07/16/2023), Disp: 30 tablet, Rfl: 0   ARIPiprazole  (ABILIFY ) 5 MG tablet, Take 5 mg by mouth  daily., Disp: , Rfl:    busPIRone  (BUSPAR ) 10 MG tablet, Take 1 tablet (10 mg total) by mouth 2 (two) times daily., Disp: 60 tablet, Rfl: 0   gabapentin  (NEURONTIN ) 600 MG tablet, Take 600 mg by mouth 3 (three) times daily., Disp: , Rfl:    hydrochlorothiazide  (HYDRODIURIL ) 25 MG tablet, Take 25 mg by mouth daily., Disp: , Rfl:    ibuprofen  (ADVIL ) 200 MG tablet, Take 400-800 mg by mouth 2 (two) times daily as needed for mild pain (pain score 1-3) or moderate pain (pain score 4-6)., Disp: , Rfl:    levothyroxine  (SYNTHROID ) 100 MCG tablet, Take 100 mcg by mouth daily before breakfast., Disp: , Rfl:    magnesium  oxide (MAG-OX) 400 (240 Mg) MG tablet, Take 400 mg by mouth daily., Disp: , Rfl:    omeprazole (PRILOSEC) 20 MG capsule, Take 20 mg by mouth daily., Disp: , Rfl:    venlafaxine  XR (EFFEXOR -XR) 150 MG 24 hr capsule, Take 1 capsule (150 mg total) by mouth daily., Disp: 30 capsule, Rfl: 0  Allergies: Allergies  Allergen Reactions   Deltasone [Prednisone] Other (See Comments)    Weakness  Jitteriness  Night sweats   Lopid [Gemfibrozil] Other (See Comments)    Myalgias    Norvasc  [Amlodipine ] Swelling    Leg swelling   Reglan  [Metoclopramide ]  Other (See Comments)    Felt hot Flushing   Statins Other (See Comments)    Myalgias   Tylenol  [Acetaminophen ] Other (See Comments)    "Liver disease"   Zestril [Lisinopril] Other (See Comments)    "made kidneys shut down"    Alfreida Inches, NP-PMHNP-BC

## 2023-07-17 NOTE — Progress Notes (Signed)
 Patient has been denied by [facility] due to no appropriate beds available. Patient meets [facility] inpatient criteria per Arn Beth, NP. Patient has been faxed out to the following facilities:   [facility] [address] [place] [place] [address] [phone]  [facility] [address] Johnella Naas [place] [address] [phone]  [facility] [address] [place] [address] [phone]  [facility] [address] [place] [address] [phone]  [facility] [address] [place] [address] [phone]  [facility] [address] [place], [place] [address] [phone]  [facility] Patient Placement [facility], [place] [phone]  [facility] [address] [place] [address] [phone]  [facility] [address] [place] [address] [phone]  [facility] Josephina Nicks [place] [address] [phone]  [facility] [address] [place] [address] [phone]  [facility] EFAX [address], [place] [phone]  [facility] [address], [place] [address] [phone]  [facility] [address] [place] [address] [phone]  [facility] [address] [place], [place] [address] [phone]  [facility] [address] [place] [address] [phone]  [facility] [address] [place] [address] [phone]  [facility] [address] [place] [address] [phone]  [facility] [address] [place] [address] [phone]  [facility] [address], [place] [address] [phone]   Phares Brasher, MSW, LCSW-A  11:30 AM 07/17/2023

## 2023-07-17 NOTE — ED Notes (Addendum)
 Assumed care of pt, ambulates independently, steady gait noted

## 2023-07-17 NOTE — ED Notes (Signed)
 Poison control called back. They recommended a repeat EKG. If the QTc is less than 500 she can be medically cleared. EDP notified.

## 2023-07-17 NOTE — ED Notes (Signed)
 Pt reports to this writer " I didn't want to kill myself, I just did it so I could come back here and sleep." Pt reports she is homeless and has no family in the area. Pt denies any SI/HI at this time

## 2023-07-18 LAB — POTASSIUM: Potassium: 4.1 mmol/L (ref 3.5–5.1)

## 2023-07-18 NOTE — ED Notes (Addendum)
 Pt has been ACCEPTED to [facility] for Monday 4/21 Reporting number is [phone], call anytime after 9am. Accepting is Lavona Pounds, MD

## 2023-07-18 NOTE — Progress Notes (Signed)
 Patient has been denied by [facility] and has been faxed out. Patient meets BH inpatient criteria per Arvell Latin, NP. Patient has been faxed out to the following facilities:   [facility] [address] [place] [address] [phone]  [facility] [address] Johnella Naas [place] [address] [phone]  [facility] [address] [place] [address] [phone]  [facility] [address] [place] [address] [phone]  [facility] [address] [place] [address] [phone]  [facility] [address] [place], [place] [address] [phone]  [facility] Patient Placement [facility], [place] [phone]  [facility] [address] [place] [address] [phone]  [facility] [address] [place] [address] [phone]  [facility] Josephina Nicks [place] [address] [phone]  [facility] [address] [place] [address] [phone]  [facility] [address] [place] [phone]  [facility] [address] [place] [address] [phone]  [facility] [address] [place] [address] [phone]  [facility] [address] [place], [place] [address] [phone]  [facility] [address] [place] [address] [phone]  [facility] [address], [place] [address] [phone]  [facility] [address] [place] [address] [phone]  [facility] [address], [place] [address] [phone]  [facility] [address] [place] [address] [phone]   Phares Brasher, MSW, LCSW-A  10:13 AM 07/18/2023

## 2023-07-18 NOTE — Progress Notes (Signed)
 [facility] LCSW Progress Note   07/18/2023    11:44 AM  Alisha Webster   [id number]   Type of Contact and Topic:  Psychiatric Bed Placement   Pt accepted to [facility]    Patient meets inpatient criteria per Arn Beth, NP  The attending provider will be Dr. Lavona Pounds   Call report to [phone]  Judeen Nose, RN @ [facility] ED notified.     Pt scheduled  to arrive at [facility] for today.    Pat Sires, MSW, LCSW-A  11:46 AM 07/18/2023

## 2023-07-18 NOTE — ED Provider Notes (Signed)
 Emergency Medicine Observation Re-evaluation Note  Alisha Webster is a 53 y.o. female, seen on rounds today.  Pt initially presented to the ED for complaints of Suicidal Currently, the patient is sleeping.  Physical Exam  BP 139/87 (BP Location: Right Arm)   Pulse 76   Temp 97.8 F (36.6 C) (Oral)   Resp 16   Ht 1.626 m (5\' 4" )   Wt 77.1 kg   SpO2 96%   BMI 29.18 kg/m  Physical Exam General: nad Cardiac: rrr Lungs: no distress Psych: sleeping currently  ED Course / MDM  EKG:EKG Interpretation Date/Time:  Saturday July 17 2023 06:19:47 EDT Ventricular Rate:  73 PR Interval:  150 QRS Duration:  82 QT Interval:  512 QTC Calculation: 564 R Axis:   34  Text Interpretation: Critical Test Result: Long QTc Normal sinus rhythm Nonspecific T wave abnormality Prolonged QT Abnormal ECG When compared with ECG of 16-Jul-2023 14:59, PREVIOUS ECG IS PRESENT Confirmed by Rosealee Concha ([id number]) on 07/17/2023 6:21:38 AM  I have reviewed the labs performed to date as well as medications administered while in observation.  Recent changes in the last 24 hours include medicall cleared, psych saw and evaluated. Mild hypokalemia, will recheck Plan  Current plan is for inpatient psych admission.    Auston Blush, MD 07/18/23 [phone]

## 2023-07-19 NOTE — ED Provider Notes (Signed)
 Emergency Medicine Observation Re-evaluation Note  Alisha Webster is a 53 y.o. female, seen on rounds today.  Pt initially presented to the ED for complaints of Suicidal Currently, the patient is asleep.  Physical Exam  BP 118/70 (BP Location: Right Arm)   Pulse 75   Temp (!) 97.1 F (36.2 C) (Oral)   Resp 16   Ht 5\' 4"  (1.626 m)   Wt 77.1 kg   SpO2 96%   BMI 29.18 kg/m  Physical Exam General: asleep Cardiac: rr Lungs: clear Psych: asleep  ED Course / MDM  EKG:EKG Interpretation Date/Time:  Saturday July 17 2023 11:39:39 EDT Ventricular Rate:  85 PR Interval:  142 QRS Duration:  86 QT Interval:  406 QTC Calculation: 483 R Axis:   46  Text Interpretation: Normal sinus rhythm Nonspecific ST and T wave abnormality Prolonged QT Abnormal ECG When compared with ECG of 17-Jul-2023 06:19, PREVIOUS ECG IS PRESENT Confirmed by Rafael Bun [phone]) on 07/18/2023 1:20:18 PM  I have reviewed the labs performed to date as well as medications administered while in observation.  Recent changes in the last 24 hours include acceptance to [facility].  Plan  Current plan is for tx to [facility] today.    Sueellen Emery, MD 07/19/23 0730

## 2023-07-19 NOTE — ED Notes (Signed)
 Pt refused vitals

## 2023-07-19 NOTE — ED Notes (Addendum)
 [phone] ADMISSIONS  [phone] 2 PAGER CALLED AT 1030 TO LEAVE A VM TO GIVE REPORT.
